# Patient Record
Sex: Male | Born: 1960 | Race: Black or African American | Hispanic: No | Marital: Married | Smoking: Never smoker
Health system: Southern US, Community
[De-identification: ages and names within clinical notes are randomized; demographics above are authoritative.]

## PROBLEM LIST (undated history)

## (undated) DIAGNOSIS — G4730 Sleep apnea, unspecified: Secondary | ICD-10-CM

## (undated) DIAGNOSIS — M259 Joint disorder, unspecified: Secondary | ICD-10-CM

## (undated) DIAGNOSIS — M1990 Unspecified osteoarthritis, unspecified site: Secondary | ICD-10-CM

## (undated) DIAGNOSIS — M549 Dorsalgia, unspecified: Secondary | ICD-10-CM

## (undated) DIAGNOSIS — Z9109 Other allergy status, other than to drugs and biological substances: Secondary | ICD-10-CM

## (undated) HISTORY — DX: Dorsalgia, unspecified: M54.9

## (undated) HISTORY — DX: Joint disorder, unspecified: M25.9

## (undated) HISTORY — PX: APPENDECTOMY: SHX54

## (undated) HISTORY — PX: WISDOM TOOTH EXTRACTION: SHX21

## (undated) HISTORY — PX: KNEE SURGERY: SHX244

---

## 1997-12-27 ENCOUNTER — Emergency Department (HOSPITAL_COMMUNITY): Admission: EM | Admit: 1997-12-27 | Discharge: 1997-12-27 | Payer: Self-pay | Admitting: Emergency Medicine

## 1998-09-15 ENCOUNTER — Emergency Department (HOSPITAL_COMMUNITY): Admission: EM | Admit: 1998-09-15 | Discharge: 1998-09-15 | Payer: Self-pay | Admitting: Emergency Medicine

## 2000-04-21 ENCOUNTER — Encounter: Payer: Self-pay | Admitting: Emergency Medicine

## 2000-04-21 ENCOUNTER — Emergency Department (HOSPITAL_COMMUNITY): Admission: EM | Admit: 2000-04-21 | Discharge: 2000-04-21 | Payer: Self-pay | Admitting: Emergency Medicine

## 2000-09-29 ENCOUNTER — Emergency Department (HOSPITAL_COMMUNITY): Admission: EM | Admit: 2000-09-29 | Discharge: 2000-09-30 | Payer: Self-pay

## 2000-09-29 ENCOUNTER — Emergency Department (HOSPITAL_COMMUNITY): Admission: EM | Admit: 2000-09-29 | Discharge: 2000-09-29 | Payer: Self-pay | Admitting: Emergency Medicine

## 2001-03-16 ENCOUNTER — Emergency Department (HOSPITAL_COMMUNITY): Admission: EM | Admit: 2001-03-16 | Discharge: 2001-03-17 | Payer: Self-pay | Admitting: Emergency Medicine

## 2001-08-27 ENCOUNTER — Encounter (INDEPENDENT_AMBULATORY_CARE_PROVIDER_SITE_OTHER): Payer: Self-pay | Admitting: Specialist

## 2001-08-28 ENCOUNTER — Inpatient Hospital Stay (HOSPITAL_COMMUNITY): Admission: EM | Admit: 2001-08-28 | Discharge: 2001-08-29 | Payer: Self-pay | Admitting: Emergency Medicine

## 2002-04-17 ENCOUNTER — Emergency Department (HOSPITAL_COMMUNITY): Admission: EM | Admit: 2002-04-17 | Discharge: 2002-04-17 | Payer: Self-pay

## 2002-06-08 ENCOUNTER — Encounter: Payer: Self-pay | Admitting: Emergency Medicine

## 2002-06-08 ENCOUNTER — Emergency Department (HOSPITAL_COMMUNITY): Admission: EM | Admit: 2002-06-08 | Discharge: 2002-06-08 | Payer: Self-pay | Admitting: Emergency Medicine

## 2002-06-13 ENCOUNTER — Encounter: Payer: Self-pay | Admitting: Emergency Medicine

## 2002-06-13 ENCOUNTER — Emergency Department (HOSPITAL_COMMUNITY): Admission: EM | Admit: 2002-06-13 | Discharge: 2002-06-13 | Payer: Self-pay | Admitting: Emergency Medicine

## 2002-06-15 ENCOUNTER — Emergency Department (HOSPITAL_COMMUNITY): Admission: EM | Admit: 2002-06-15 | Discharge: 2002-06-15 | Payer: Self-pay | Admitting: Emergency Medicine

## 2002-07-23 ENCOUNTER — Emergency Department (HOSPITAL_COMMUNITY): Admission: EM | Admit: 2002-07-23 | Discharge: 2002-07-23 | Payer: Self-pay | Admitting: Emergency Medicine

## 2002-07-24 ENCOUNTER — Encounter: Payer: Self-pay | Admitting: Emergency Medicine

## 2002-07-25 ENCOUNTER — Emergency Department (HOSPITAL_COMMUNITY): Admission: EM | Admit: 2002-07-25 | Discharge: 2002-07-26 | Payer: Self-pay | Admitting: Emergency Medicine

## 2002-09-09 ENCOUNTER — Encounter: Payer: Self-pay | Admitting: Orthopedic Surgery

## 2002-09-09 ENCOUNTER — Ambulatory Visit (HOSPITAL_COMMUNITY): Admission: RE | Admit: 2002-09-09 | Discharge: 2002-09-09 | Payer: Self-pay | Admitting: Orthopedic Surgery

## 2003-01-25 ENCOUNTER — Emergency Department (HOSPITAL_COMMUNITY): Admission: EM | Admit: 2003-01-25 | Discharge: 2003-01-25 | Payer: Self-pay | Admitting: Emergency Medicine

## 2003-03-17 ENCOUNTER — Emergency Department (HOSPITAL_COMMUNITY): Admission: EM | Admit: 2003-03-17 | Discharge: 2003-03-17 | Payer: Self-pay | Admitting: Emergency Medicine

## 2003-06-04 ENCOUNTER — Emergency Department (HOSPITAL_COMMUNITY): Admission: EM | Admit: 2003-06-04 | Discharge: 2003-06-04 | Payer: Self-pay | Admitting: *Deleted

## 2003-06-05 ENCOUNTER — Emergency Department (HOSPITAL_COMMUNITY): Admission: EM | Admit: 2003-06-05 | Discharge: 2003-06-05 | Payer: Self-pay | Admitting: Emergency Medicine

## 2003-12-27 ENCOUNTER — Emergency Department (HOSPITAL_COMMUNITY): Admission: EM | Admit: 2003-12-27 | Discharge: 2003-12-28 | Payer: Self-pay | Admitting: Emergency Medicine

## 2004-12-15 ENCOUNTER — Emergency Department (HOSPITAL_COMMUNITY): Admission: EM | Admit: 2004-12-15 | Discharge: 2004-12-15 | Payer: Self-pay | Admitting: Emergency Medicine

## 2005-02-24 ENCOUNTER — Emergency Department (HOSPITAL_COMMUNITY): Admission: EM | Admit: 2005-02-24 | Discharge: 2005-02-24 | Payer: Self-pay | Admitting: Emergency Medicine

## 2005-05-01 ENCOUNTER — Emergency Department (HOSPITAL_COMMUNITY): Admission: EM | Admit: 2005-05-01 | Discharge: 2005-05-01 | Payer: Self-pay | Admitting: Emergency Medicine

## 2007-01-24 ENCOUNTER — Encounter: Admission: RE | Admit: 2007-01-24 | Discharge: 2007-02-26 | Payer: Self-pay | Admitting: Orthopedic Surgery

## 2007-01-31 ENCOUNTER — Emergency Department (HOSPITAL_COMMUNITY): Admission: EM | Admit: 2007-01-31 | Discharge: 2007-01-31 | Payer: Self-pay | Admitting: Emergency Medicine

## 2007-02-04 ENCOUNTER — Emergency Department (HOSPITAL_COMMUNITY): Admission: EM | Admit: 2007-02-04 | Discharge: 2007-02-04 | Payer: Self-pay | Admitting: Family Medicine

## 2009-05-13 ENCOUNTER — Emergency Department (HOSPITAL_COMMUNITY): Admission: EM | Admit: 2009-05-13 | Discharge: 2009-05-13 | Payer: Self-pay | Admitting: Emergency Medicine

## 2010-09-02 NOTE — H&P (Signed)
[facility]  Patient:    Craig Lopez, Craig Lopez Visit Number: [id number] MRN: [id number]          Service Type: MED Location: [phone] Attending Physician:  Arlis Porta Dictated by:   Adolph Pollack, M.D. Admit Date:  08/27/2001                           History and Physical  CHIEF COMPLAINT:  Right lower quadrant abdominal pain.  HISTORY OF PRESENT ILLNESS:  The patient is a 50 year old male who two days ago began having some vague abdominal pain with diarrhea.  After the diarrheal discomfort improved somewhat, then he began having some nausea and vomiting and then sharp right lower quadrant pain that became persistently worse throughout today.  He has had no fever or chills.  He still had some nausea. He tried eating some hamburgers for supper, which he was able to keep down; however, this actually made the pain somewhat worse.  He presented to the emergency department because of the progressively increasing pain in the right lower quadrant region.  He had a normal bowel movement yesterday but none today.  No dysuria or hematuria.  No blood in his stool.  He states he has never had any anything like this before.  PAST MEDICAL HISTORY:  No chronic illnesses.  PAST SURGICAL HISTORY:  Left knee surgery x 4.  DRUG ALLERGIES:  None known.  MEDICATIONS:  Zyrtec for intermittent allergic rhinitis.  SOCIAL HISTORY:  Denies tobacco use.  Occasionally has an alcoholic beverage. He is married and works in Development worker, international aid.  FAMILY HISTORY:  Positive for diabetes mellitus in the majority of his family.  REVIEW OF SYSTEMS:  CARDIOVASCULAR:  No hypertension or heart disease. PULMONARY:  No recent upper respiratory infections.  No tuberculosis or pneumonia or asthma.  GASTROINTESTINAL:  No hepatitis or peptic ulcer disease. GENITOURINARY:  No kidney stones.  ENDOCRINE:  No diabetes.  NEUROLOGIC:  No seizures.  HEMATOLOGIC:  No bleeding disorders  or blood clots.  PHYSICAL EXAMINATION:  GENERAL:  Well-developed, well-nourished male in no acute distress.  Pleasant and cooperative.  VITAL SIGNS:  Temperature normal at 98.4, with normal blood pressure and heart rate.  NECK:  Supple.  Without palpable masses.  CARDIOVASCULAR:  Regular rate and rhythm without a murmur.  LUNGS:  Breath sounds equal and clear.  Respirations are nonlabored.  ABDOMEN:  Soft.  There is right lower quadrant tenderness and guarding over McBurneys point region.  He does have slight referred signs with a positive obturator sign and a positive iliopsoas sign.  Decreased bowel sounds.  GENITOURINARY:  No hernias present.  No penile lesions.  MUSCULOSKELETAL:  Full range of motion.  No cyanosis or edema is present.  LABORATORY DATA:  Remarkable for white blood cell count of 6200, with a normal differential.  Hemoglobin 13.8.  Urinalysis is negative.  IMPRESSION:  Focal right lower quadrant tenderness and guarding consistent with focal peritoneal signs.  He has a normal white blood cell count with normal differential.  Normal urinalysis.  He does now have some anorexia and nausea.  His pain has been progressively worsening.  Concern is for acute appendicitis.  Other possibilities include a mesenteric adenitis or a viral gastroenteritis.  PLAN:  I discussed options with him including observation.  We also discussed abdominal CT scan which I told him may or may not be helpful, depending on whether we could see the  appendix.  I discussed the definitive procedure, which was a diagnostic laparoscopy.  I said that we would be able to visualize the appendix and have a definitive answer based on this.  After discussing it further with him, he has chosen the diagnostic laparoscopy.  I did explain the procedure and the risks including but not limited to bleeding, infection, accidental damage to intra-abdominal organs such as the intestine or bladder. I also  discussed with him the fact that if the appendix was normal and it would be simple to remove we may do an incidental appendectomy so if the situation arises again there will be no question.  He seems to understand this and agrees to proceed.Dictated by:   Adolph Pollack, M.D. Attending Physician:  Arlis Porta DD:  08/27/01 TD:  08/29/01 Job: [id number]

## 2010-09-02 NOTE — Op Note (Signed)
[facility]  Patient:    Craig Lopez, Craig Lopez Visit Number: [id number] MRN: [id number]          Service Type: MED Location: [phone] Attending Physician:  Arlis Porta Dictated by:   Adolph Pollack, M.D. Proc. Date: 08/28/01 Admit Date:  08/27/2001                             Operative Report  PREOPERATIVE DIAGNOSIS:  Acute appendicitis.  POSTOPERATIVE DIAGNOSIS:  Acute appendicitis.  PROCEDURE:  Laparoscopic appendectomy.  SURGEON:  Adolph Pollack, M.D.  ANESTHESIA:  General.  INDICATIONS FOR PROCEDURE:  This is a 50 year old male with vague abdominal pain two days ago that radiated to the right lower quadrant and has been progressively worsening. He has peritoneal signs and a right lower quadrant examination. He is now brought to the operating room.  TECHNIQUE:  He is placed supine on the operating table and a general anesthetic was administered. A Foley catheter was placed in his bladder. His abdominal wall was shaved and sterilely prepped and draped. A small subumbilical incision was made incising the skin sharply down to the level of the fascia. A 1 1/2 cm incision was made in the midline fascia. The peritoneal cavity was entered sharply and under direct vision. No underlying bowel injury was noted. A Hasson trocar was introduced into the peritoneal cavity and a pneumoperitoneum created by insufflation of CO2 gas. Next, the laparoscope was introduced. The patient was placed in the Trendelenburg position and rotated toward the left. An inflamed indurated appendix was noted in the right lower quadrant. Under direct vision, a 5 mm trocar was placed in the suprapubic region just to the right of the midline. Another 5 mm trocar was placed in the right upper quadrant. The appendix was grasped and I divided the mesoappendix and appendiceal artery with the harmonic scalpel completely mobilizing the appendix from the lateral  attachments of the abdominal wall. I did this down to the base of the cecum. I then amputated the appendix at the base of the cecum with the endoGIA stapler. Clips were placed on the lateral portion of the staple line as I had to cut part of this away. This allowed for adequate shielding of the cecal area where the appendix was amputated.  Next, I placed the appendix in an endopouch bag and removed it from the abdominal cavity. I then replaced the Hasson trocar and irrigated out the right lower quadrant with a liter of normal saline. I evacuated the fluid. I examined the staple line and it was intact without evidence of bleeding or leakage.  Next, I removed the right lower quadrant trocar. I then removed the Hasson trocar and under laparoscopic vision closed the fascial defect by tightening up and tying down the pursestring suture. Finally the pneumoperitoneum was released and the remaining 5 mm right upper quadrant trocar was removed. The skin incisions were closed with 4-0 monocryl subcuticular stitches. Steri-Strips and sterile dressings were applied.  He tolerated the procedure well without any apparent complications and was taken to the recovery room in satisfactory condition. Dictated by:   Adolph Pollack, M.D. Attending Physician:  Arlis Porta DD:  08/28/01 TD:  08/29/01 Job: [id number]

## 2012-08-05 ENCOUNTER — Encounter (HOSPITAL_BASED_OUTPATIENT_CLINIC_OR_DEPARTMENT_OTHER): Payer: Self-pay | Admitting: *Deleted

## 2012-08-05 ENCOUNTER — Emergency Department (HOSPITAL_BASED_OUTPATIENT_CLINIC_OR_DEPARTMENT_OTHER)
Admission: EM | Admit: 2012-08-05 | Discharge: 2012-08-06 | Disposition: A | Discharge status: Home / Self Care | Payer: Non-veteran care | Attending: Emergency Medicine | Admitting: Emergency Medicine

## 2012-08-05 DIAGNOSIS — H1045 Other chronic allergic conjunctivitis: Secondary | ICD-10-CM | POA: Insufficient documentation

## 2012-08-05 DIAGNOSIS — H1013 Acute atopic conjunctivitis, bilateral: Secondary | ICD-10-CM

## 2012-08-05 DIAGNOSIS — H5789 Other specified disorders of eye and adnexa: Secondary | ICD-10-CM | POA: Insufficient documentation

## 2012-08-05 DIAGNOSIS — R509 Fever, unspecified: Secondary | ICD-10-CM | POA: Insufficient documentation

## 2012-08-05 DIAGNOSIS — J329 Chronic sinusitis, unspecified: Secondary | ICD-10-CM | POA: Insufficient documentation

## 2012-08-05 MED ORDER — AMOXICILLIN-POT CLAVULANATE 875-125 MG PO TABS: ORAL_TABLET | ORAL | Status: DC

## 2012-08-05 MED ORDER — AZELASTINE HCL 0.05 % OP SOLN: 1.0000 [drp] | Freq: Two times a day (BID) | OPHTHALMIC | Status: DC

## 2012-08-05 MED ORDER — KETOROLAC TROMETHAMINE 30 MG/ML IJ SOLN: 30.0000 mg | Freq: Once | INTRAMUSCULAR | Status: DC

## 2012-08-05 NOTE — ED Provider Notes (Signed)
History     CSN: [id number]  Arrival date & time 08/05/12  4098   First MD Initiated Contact with Patient 08/05/12 2215      Chief Complaint  Patient presents with  . Nasal Congestion    (Consider location/radiation/quality/duration/timing/severity/associated sxs/prior treatment) HPI Comments: Craig Lopez is a 52 y/o M presenting to the ED with nasal congestion x 1 week ago. Patient reported that he has been feeling facial pressure around in left eye and maxillary region for the past week as well. Stated that when he blows his nose, yellow, thick mucus comes out. Reported eye injection along with burning and pruritis that has been going on x 1 week. Patient reported that he is allergic to "almost everything." Patient reported that he has been using over the counter medication without success from relief. Associated symptoms are subjective fevers. Reported that it is common for him to get a sinus infection every 2 years. Denied chills, cough, sneezing, chest pain, shortness of breathe, difficulty breathing, gi symptoms, urinary symptoms, ear pain, tinnitus, bodyaches, dysphagia, eye tearing, injury, denied any other complaints.    The history is provided by the patient. No language interpreter was used.    History reviewed. No pertinent past medical history.  Past Surgical History  Procedure Laterality Date  . Joint replacement    . Appendectomy      History reviewed. No pertinent family history.  History  Substance Use Topics  . Smoking status: Never Smoker   . Smokeless tobacco: Not on file  . Alcohol Use: No      Review of Systems  Constitutional: Positive for fever. Negative for chills.  HENT: Positive for congestion, rhinorrhea and sinus pressure. Negative for ear pain, sore throat, sneezing, drooling, trouble swallowing, neck pain, neck stiffness, postnasal drip and tinnitus.   Eyes: Positive for redness and itching. Negative for photophobia, pain and visual  disturbance.  Respiratory: Negative for cough, chest tightness and shortness of breath.   Cardiovascular: Negative for chest pain.  Gastrointestinal: Negative for nausea, vomiting, abdominal pain, diarrhea and constipation.  Genitourinary: Negative for dysuria, hematuria, decreased urine volume and difficulty urinating.  Neurological: Negative for dizziness, weakness, light-headedness, numbness and headaches.  All other systems reviewed and are negative.    Allergies  Review of patient's allergies indicates no known allergies.  Home Medications   Current Outpatient Rx  Name  Route  Sig  Dispense  Refill  . amoxicillin-clavulanate (AUGMENTIN) 875-125 MG per tablet      Take 1 tablet PO BID x 7 days.   14 tablet   0   . azelastine (OPTIVAR) 0.05 % ophthalmic solution   Both Eyes   Place 1 drop into both eyes 2 (two) times daily.   6 mL   0     BP 129/92  Pulse 85  Temp(Src) 97.8 F (36.6 C) (Oral)  Resp 16  Ht 6\' 1"  (1.854 m)  Wt 286 lb (129.729 kg)  BMI 37.74 kg/m2  SpO2 100%  Physical Exam  Nursing note and vitals reviewed. Constitutional: He is oriented to person, place, and time. He appears well-developed and well-nourished. No distress.  HENT:  Head: Normocephalic and atraumatic.    Mouth/Throat: Oropharynx is clear and moist. No oropharyngeal exudate.  Pressure felt upon palpation to the left maxillary region and left temple region Pressure felt when head tilted forward between legs, predominantly to the left maxillary region  Eyes: EOM are normal. Pupils are equal, round, and reactive to light. Right  eye exhibits no discharge. Left eye exhibits no discharge.  Injection noted to eyes bilaterally Negative tearing of eyes  Neck: Normal range of motion. Neck supple. No tracheal deviation present.  Negative lymphadenopathy  Cardiovascular: Normal rate, regular rhythm and normal heart sounds.  Exam reveals no friction rub.   No murmur heard. Pulmonary/Chest:  Effort normal and breath sounds normal. No respiratory distress. He has no wheezes. He has no rales. He exhibits no tenderness.  Lymphadenopathy:    He has no cervical adenopathy.  Neurological: He is alert and oriented to person, place, and time. No cranial nerve deficit. He exhibits normal muscle tone. Coordination normal.  Skin: Skin is warm and dry. No rash noted. He is not diaphoretic. No erythema.  Psychiatric: He has a normal mood and affect. His behavior is normal. Thought content normal.    ED Course  Procedures (including critical care time)  Labs Reviewed - No data to display No results found.   1. Sinusitis   2. Allergic conjunctivitis, bilateral       MDM  Patient afebrile, normotensive, non-tachycardic, alert and oriented. Denied pain. Pruritic, injected eyes with reported allergies - suspected allergic conjunctivitis. Left maxillary facial pressure upon palpation and with tilting of head forward, suspected sinusitis. Discussed case with Dr. Manus Gunning, agreed that CT maxillofacial was not warranted at this moment. Patient non-toxic appearing, aseptic, no acute distress. Patient pleasant and in good spirits upon exam and evaluation. Discharged patient. Discharged patient with antibiotics and anti-histamine eye drops - explained course of treatment. Recommended patient to follow-up with [facility] to be re-evaluated. Discussed with patient to use vix vapor rub and humidifier to aid in congestion. Discussed with patient to rest and stay hydrated. Discussed with patient to monitor symptoms and if symptoms are to worsen or change to report back to the ED. Patient agreed to plan of care, understood, all questions answered.         Raymon Mutton, PA-C 08/06/12 1600

## 2012-08-05 NOTE — ED Notes (Signed)
Pt c/o nasal congestion x 1 week no relief from OTC.

## 2012-08-06 NOTE — ED Provider Notes (Signed)
Medical screening examination/treatment/procedure(s) were performed by non-physician practitioner and as supervising physician I was immediately available for consultation/collaboration.   Glynn Octave, MD 08/06/12 [phone]

## 2012-09-02 ENCOUNTER — Encounter (HOSPITAL_COMMUNITY): Payer: Self-pay | Admitting: *Deleted

## 2012-09-02 ENCOUNTER — Emergency Department (HOSPITAL_COMMUNITY): Payer: Non-veteran care

## 2012-09-02 ENCOUNTER — Emergency Department (HOSPITAL_COMMUNITY)
Admission: EM | Admit: 2012-09-02 | Discharge: 2012-09-02 | Disposition: A | Discharge status: Home / Self Care | Payer: Non-veteran care | Attending: Emergency Medicine | Admitting: Emergency Medicine

## 2012-09-02 DIAGNOSIS — J069 Acute upper respiratory infection, unspecified: Secondary | ICD-10-CM | POA: Insufficient documentation

## 2012-09-02 HISTORY — DX: Other allergy status, other than to drugs and biological substances: Z91.09

## 2012-09-02 MED ORDER — AZITHROMYCIN 250 MG PO TABS
500.0000 mg | ORAL_TABLET | Freq: Once | ORAL | Status: AC
  Administered 2012-09-02: 500 mg via ORAL
  Filled 2012-09-02: qty 2

## 2012-09-02 MED ORDER — AZITHROMYCIN 250 MG PO TABS: 250.0000 mg | ORAL_TABLET | Freq: Every day | ORAL | Status: DC

## 2012-09-02 MED ORDER — ALBUTEROL SULFATE HFA 108 (90 BASE) MCG/ACT IN AERS
2.0000 | INHALATION_SPRAY | RESPIRATORY_TRACT | Status: DC | PRN
  Administered 2012-09-02: 2 via RESPIRATORY_TRACT
  Filled 2012-09-02: qty 6.7

## 2012-09-02 MED ORDER — GUAIFENESIN-CODEINE 100-10 MG/5ML PO SYRP: 5.0000 mL | ORAL_SOLUTION | Freq: Three times a day (TID) | ORAL | Status: DC | PRN

## 2012-09-02 MED ORDER — GUAIFENESIN-CODEINE 100-10 MG/5ML PO SOLN: 5.0000 mL | Freq: Once | ORAL | Status: DC

## 2012-09-02 NOTE — ED Notes (Signed)
Pt states that he has had a recent sinus infection a few weeks ago and has had a present cough; pt states that he has had a persistent cough for the last few weeks; pt states that he began to have some soreness with cough approx 10 days ago and that it has gotten progressively worse; pt states that he feel sore in his chest bilaterally and worse with cough.

## 2012-09-02 NOTE — ED Provider Notes (Signed)
History    This chart was scribed for non-physician practitioner, Dorthula Matas PA-C working with Hurman Horn, MD by Donne Anon, ED Scribe. This patient was seen in room WTR8/WTR8 and the patient's care was started at 2126.   CSN: [id number]  Arrival date & time 09/02/12  1910   First MD Initiated Contact with Patient 09/02/12 2126      Chief Complaint  Patient presents with  . Cough     The history is provided by the patient. No language interpreter was used.   HPI Comments: Craig Lopez is a 52 y.o. male who presents to the Emergency Department complaining of gradual onset, gradually worsening, constant cough which began more than 1 month ago. He states that he had a sinus infection a few weeks ago that was relieved by Augmentin and the cough began after the sinus infection resolved. He reports associated chest wall tenderness that is worse when he coughs as well as rhinorhea. He reports that he experiences coughing fits, especially when he talks to much. He denies CP, SOB, fever or any other pain. He currently takes Claritin for his allergies which provide mild relief. He does not take blood pressure medication and states he is otherwise healthy.   Past Medical History  Diagnosis Date  . Environmental allergies     Past Surgical History  Procedure Laterality Date  . Joint replacement    . Appendectomy    . Knee surgery      No family history on file.  History  Substance Use Topics  . Smoking status: Never Smoker   . Smokeless tobacco: Not on file  . Alcohol Use: Yes     Comment: socially      Review of Systems  Constitutional: Negative for fever.  HENT: Positive for rhinorrhea. Negative for congestion.   Respiratory: Positive for cough. Negative for shortness of breath.   Cardiovascular: Negative for chest pain.  All other systems reviewed and are negative.    Allergies  Review of patient's allergies indicates no known allergies.  Home Medications    Current Outpatient Rx  Name  Route  Sig  Dispense  Refill  . azelastine (OPTIVAR) 0.05 % ophthalmic solution   Both Eyes   Place 1 drop into both eyes 2 (two) times daily.   6 mL   0   . ibuprofen (ADVIL,MOTRIN) 800 MG tablet   Oral   Take 800 mg by mouth every 8 (eight) hours as needed for pain.         Marland Kitchen azithromycin (ZITHROMAX Z-PAK) 250 MG tablet   Oral   Take 1 tablet (250 mg total) by mouth daily.   4 tablet   0     Starting on 09/03/2012   . guaiFENesin-codeine (ROBITUSSIN AC) 100-10 MG/5ML syrup   Oral   Take 5 mLs by mouth 3 (three) times daily as needed for cough.   120 mL   0     BP 122/76  Pulse 70  Temp(Src) 98.1 F (36.7 C) (Oral)  Resp 18  Ht 6\' 1"  (1.854 m)  Wt 282 lb (127.914 kg)  BMI 37.21 kg/m2  SpO2 99%  Physical Exam  Nursing note and vitals reviewed. Constitutional: He is oriented to person, place, and time. He appears well-developed and well-nourished. No distress.  HENT:  Head: Normocephalic and atraumatic.  Eyes: EOM are normal.  Neck: Neck supple. No tracheal deviation present.  Cardiovascular: Normal rate.   Pulmonary/Chest: Effort normal. No respiratory  distress. He has no wheezes.  Coughing during exam   Musculoskeletal: Normal range of motion.  Neurological: He is alert and oriented to person, place, and time.  Skin: Skin is warm and dry.  Psychiatric: He has a normal mood and affect. His behavior is normal.    ED Course  Procedures (including critical care time) DIAGNOSTIC STUDIES: Oxygen Saturation is 99% on room air, normal by my interpretation.    COORDINATION OF CARE: 9:27 PM Discussed treatment plan which includes 500 mg Zithromax, Albuterol, guaifenesin-codeine and a decongestant with pt at bedside and pt agreed to plan. Informed of normal CXR.  Meds ordered this encounter  Medications  . ibuprofen (ADVIL,MOTRIN) 800 MG tablet    Sig: Take 800 mg by mouth every 8 (eight) hours as needed for pain.  Marland Kitchen  azithromycin (ZITHROMAX) tablet 500 mg    Sig:   . guaiFENesin-codeine 100-10 MG/5ML solution 5 mL    Sig:   . albuterol (PROVENTIL HFA;VENTOLIN HFA) 108 (90 BASE) MCG/ACT inhaler 2 puff    Sig:   . guaiFENesin-codeine (ROBITUSSIN AC) 100-10 MG/5ML syrup    Sig: Take 5 mLs by mouth 3 (three) times daily as needed for cough.    Dispense:  120 mL    Refill:  0    Order Specific Question:  Supervising Provider    Answer:  Eber Hong D [[id number]]  . azithromycin (ZITHROMAX Z-PAK) 250 MG tablet    Sig: Take 1 tablet (250 mg total) by mouth daily.    Dispense:  4 tablet    Refill:  0    Starting on 09/03/2012    Order Specific Question:  Supervising Provider    Answer:  Eber Hong D [[id number]]     Labs Reviewed - No data to display Dg Chest 2 View  09/02/2012   *RADIOLOGY REPORT*  Clinical Data: Cough.  Pleuritic chest pain.  CHEST - 2 VIEW  Comparison:  None.  Findings:  The heart size and mediastinal contours are within normal limits.  Both lungs are clear.  The visualized skeletal structures are unremarkable.  IMPRESSION: No active cardiopulmonary disease.   Original Report Authenticated By: Myles Rosenthal, M.D.     1. URI (upper respiratory infection)       MDM  Pt has been advised of the symptoms that warrant their return to the ED. Patient has voiced understanding and has agreed to follow-up with the PCP or specialist.  I personally performed the services described in this documentation, which was scribed in my presence. The recorded information has been reviewed and is accurate.       Dorthula Matas, PA-C 09/02/12 2143

## 2012-09-09 NOTE — ED Provider Notes (Signed)
Medical screening examination/treatment/procedure(s) were performed by non-physician practitioner and as supervising physician I was immediately available for consultation/collaboration.   Deb Loudin M Eldana Isip, MD 09/09/12 1640 

## 2012-11-30 ENCOUNTER — Encounter (HOSPITAL_BASED_OUTPATIENT_CLINIC_OR_DEPARTMENT_OTHER): Payer: Self-pay | Admitting: *Deleted

## 2012-11-30 ENCOUNTER — Emergency Department (HOSPITAL_BASED_OUTPATIENT_CLINIC_OR_DEPARTMENT_OTHER)
Admission: EM | Admit: 2012-11-30 | Discharge: 2012-11-30 | Disposition: A | Discharge status: Home / Self Care | Payer: Non-veteran care | Attending: Emergency Medicine | Admitting: Emergency Medicine

## 2012-11-30 DIAGNOSIS — Y9289 Other specified places as the place of occurrence of the external cause: Secondary | ICD-10-CM | POA: Insufficient documentation

## 2012-11-30 DIAGNOSIS — W57XXXA Bitten or stung by nonvenomous insect and other nonvenomous arthropods, initial encounter: Secondary | ICD-10-CM | POA: Insufficient documentation

## 2012-11-30 DIAGNOSIS — S30860A Insect bite (nonvenomous) of lower back and pelvis, initial encounter: Secondary | ICD-10-CM | POA: Insufficient documentation

## 2012-11-30 DIAGNOSIS — L299 Pruritus, unspecified: Secondary | ICD-10-CM | POA: Insufficient documentation

## 2012-11-30 DIAGNOSIS — R21 Rash and other nonspecific skin eruption: Secondary | ICD-10-CM | POA: Insufficient documentation

## 2012-11-30 DIAGNOSIS — Y9389 Activity, other specified: Secondary | ICD-10-CM | POA: Insufficient documentation

## 2012-11-30 DIAGNOSIS — L0390 Cellulitis, unspecified: Secondary | ICD-10-CM

## 2012-11-30 DIAGNOSIS — L0231 Cutaneous abscess of buttock: Secondary | ICD-10-CM | POA: Insufficient documentation

## 2012-11-30 MED ORDER — CLINDAMYCIN HCL 300 MG PO CAPS: 300.0000 mg | ORAL_CAPSULE | Freq: Four times a day (QID) | ORAL | Status: DC

## 2012-11-30 MED ORDER — DIPHENHYDRAMINE HCL 25 MG PO CAPS
25.0000 mg | ORAL_CAPSULE | Freq: Once | ORAL | Status: AC
  Administered 2012-11-30: 25 mg via ORAL
  Filled 2012-11-30: qty 1

## 2012-11-30 MED ORDER — DIPHENHYDRAMINE HCL 50 MG/ML IJ SOLN
INTRAMUSCULAR | Status: AC
  Filled 2012-11-30: qty 1

## 2012-11-30 MED ORDER — CLINDAMYCIN HCL 150 MG PO CAPS
300.0000 mg | ORAL_CAPSULE | Freq: Once | ORAL | Status: AC
  Administered 2012-11-30: 300 mg via ORAL
  Filled 2012-11-30: qty 2

## 2012-11-30 MED ORDER — DIPHENHYDRAMINE HCL 50 MG/ML IJ SOLN
25.0000 mg | Freq: Once | INTRAMUSCULAR | Status: AC
  Administered 2012-11-30: 25 mg via INTRAMUSCULAR

## 2012-11-30 NOTE — ED Provider Notes (Signed)
CSN: [id number]     Arrival date & time 11/30/12  1146 History     First MD Initiated Contact with Patient 11/30/12 1148     Chief Complaint  Patient presents with  . Insect Bite   (Consider location/radiation/quality/duration/timing/severity/associated sxs/prior Treatment) The history is provided by the patient.  Craig Lopez is a 52 y.o. male here with possible cellulitis. Was doing yard work yesterday and noticed that he was bitten by a Animator on the right buttock. He noticed increased swelling around the site today and itchiness on his legs. No fever or chills. Denies any other bites. Denies tick exposure.    Past Medical History  Diagnosis Date  . Environmental allergies    Past Surgical History  Procedure Laterality Date  . Joint replacement    . Appendectomy    . Knee surgery     No family history on file. History  Substance Use Topics  . Smoking status: Never Smoker   . Smokeless tobacco: Not on file  . Alcohol Use: Yes     Comment: socially    Review of Systems  Skin: Positive for rash.  All other systems reviewed and are negative.    Allergies  Review of patient's allergies indicates no known allergies.  Home Medications   Current Outpatient Rx  Name  Route  Sig  Dispense  Refill  . azelastine (OPTIVAR) 0.05 % ophthalmic solution   Both Eyes   Place 1 drop into both eyes 2 (two) times daily.   6 mL   0   . azithromycin (ZITHROMAX Z-PAK) 250 MG tablet   Oral   Take 1 tablet (250 mg total) by mouth daily.   4 tablet   0     Starting on 09/03/2012   . clindamycin (CLEOCIN) 300 MG capsule   Oral   Take 1 capsule (300 mg total) by mouth 4 (four) times daily. X 7 days   28 capsule   0   . guaiFENesin-codeine (ROBITUSSIN AC) 100-10 MG/5ML syrup   Oral   Take 5 mLs by mouth 3 (three) times daily as needed for cough.   120 mL   0   . ibuprofen (ADVIL,MOTRIN) 800 MG tablet   Oral   Take 800 mg by mouth every 8 (eight) hours as needed for  pain.          BP 143/90  Pulse 73  Temp(Src) 97.3 F (36.3 C) (Oral)  Resp 18  SpO2 97% Physical Exam  Nursing note and vitals reviewed. Constitutional: He is oriented to person, place, and time. He appears well-developed.  HENT:  Head: Normocephalic.  Eyes: Conjunctivae are normal. Pupils are equal, round, and reactive to light.  Neck: Normal range of motion. Neck supple.  Cardiovascular: Normal rate.   Pulmonary/Chest: Effort normal.  Abdominal: Soft.  Musculoskeletal: Normal range of motion.  Neurological: He is alert and oriented to person, place, and time.  Skin: Skin is warm and dry. Rash noted.     R buttock with an area of erythema with tenderness. No fluctuance. No target lesion. No rash on legs  Psychiatric: He has a normal mood and affect. His behavior is normal. Judgment and thought content normal.    ED Course   Procedures (including critical care time)  Labs Reviewed - No data to display No results found. 1. Cellulitis   2. Bug bite     MDM  Craig Lopez is a 52 y.o. male here with cellulitis s/p  insect bite with early cellulitis and no evidence of abscess. Will give clinda for empiric coverage. Recommend benadryl prn itchiness.    Richardean Canal, MD 11/30/12 [phone]

## 2012-11-30 NOTE — ED Notes (Signed)
States that he was doing yard work yesterday and was bitten by a Animator and now having itching all over

## 2012-12-01 ENCOUNTER — Emergency Department (HOSPITAL_BASED_OUTPATIENT_CLINIC_OR_DEPARTMENT_OTHER)
Admission: EM | Admit: 2012-12-01 | Discharge: 2012-12-01 | Disposition: A | Discharge status: Home / Self Care | Payer: Non-veteran care | Attending: Emergency Medicine | Admitting: Emergency Medicine

## 2012-12-01 ENCOUNTER — Encounter (HOSPITAL_BASED_OUTPATIENT_CLINIC_OR_DEPARTMENT_OTHER): Payer: Self-pay

## 2012-12-01 DIAGNOSIS — L0390 Cellulitis, unspecified: Secondary | ICD-10-CM

## 2012-12-01 DIAGNOSIS — L02219 Cutaneous abscess of trunk, unspecified: Secondary | ICD-10-CM | POA: Insufficient documentation

## 2012-12-01 DIAGNOSIS — R21 Rash and other nonspecific skin eruption: Secondary | ICD-10-CM | POA: Insufficient documentation

## 2012-12-01 MED ORDER — HYDROXYZINE HCL 25 MG PO TABS: 25.0000 mg | ORAL_TABLET | Freq: Four times a day (QID) | ORAL | Status: DC

## 2012-12-01 MED ORDER — HYDROCODONE-ACETAMINOPHEN 5-325 MG PO TABS: 2.0000 | ORAL_TABLET | ORAL | Status: DC | PRN

## 2012-12-01 MED ORDER — HYDROCODONE-ACETAMINOPHEN 5-325 MG PO TABS
2.0000 | ORAL_TABLET | Freq: Once | ORAL | Status: AC
  Administered 2012-12-01: 2 via ORAL
  Filled 2012-12-01: qty 2

## 2012-12-01 MED ORDER — DIPHENHYDRAMINE HCL 25 MG PO CAPS
25.0000 mg | ORAL_CAPSULE | Freq: Once | ORAL | Status: AC
  Administered 2012-12-01: 25 mg via ORAL
  Filled 2012-12-01: qty 1

## 2012-12-01 NOTE — ED Provider Notes (Signed)
CSN: [id number]     Arrival date & time 12/01/12  0201 History     First MD Initiated Contact with Patient 12/01/12 0216     Chief Complaint  Patient presents with  . Insect Bite   (Consider location/radiation/quality/duration/timing/severity/associated sxs/prior Treatment) HPI Comments: Patient presents with redness, pain itching to his right side. He was seen yesterday and treated for cellulitis from possible bug bite. He is received one dose of clindamycin. He returns tonight with worsening pain itching he believes the redness is spreading. Denies any fever or vomiting. Denies abdominal pain. Believes he was bitten by a fire ant 2 nights ago. No tick exposure.  The history is provided by the patient.    Past Medical History  Diagnosis Date  . Environmental allergies    Past Surgical History  Procedure Laterality Date  . Joint replacement    . Appendectomy    . Knee surgery     No family history on file. History  Substance Use Topics  . Smoking status: Never Smoker   . Smokeless tobacco: Not on file  . Alcohol Use: Yes     Comment: socially    Review of Systems  Constitutional: Negative for fever, activity change and appetite change.  HENT: Negative for congestion and rhinorrhea.   Respiratory: Negative for cough, chest tightness and shortness of breath.   Cardiovascular: Negative for chest pain.  Gastrointestinal: Negative for nausea, vomiting and abdominal pain.  Genitourinary: Negative for dysuria and hematuria.  Musculoskeletal: Negative for back pain.  Skin: Positive for rash and wound.  Neurological: Negative for dizziness, weakness and headaches.  A complete 10 system review of systems was obtained and all systems are negative except as noted in the HPI and PMH.    Allergies  Review of patient's allergies indicates no known allergies.  Home Medications   Current Outpatient Rx  Name  Route  Sig  Dispense  Refill  . clindamycin (CLEOCIN) 300 MG capsule  Oral   Take 1 capsule (300 mg total) by mouth 4 (four) times daily. X 7 days   28 capsule   0   . guaiFENesin-codeine (ROBITUSSIN AC) 100-10 MG/5ML syrup   Oral   Take 5 mLs by mouth 3 (three) times daily as needed for cough.   120 mL   0   . HYDROcodone-acetaminophen (NORCO/VICODIN) 5-325 MG per tablet   Oral   Take 2 tablets by mouth every 4 (four) hours as needed for pain.   10 tablet   0   . hydrOXYzine (ATARAX/VISTARIL) 25 MG tablet   Oral   Take 1 tablet (25 mg total) by mouth every 6 (six) hours.   12 tablet   0   . ibuprofen (ADVIL,MOTRIN) 800 MG tablet   Oral   Take 800 mg by mouth every 8 (eight) hours as needed for pain.          BP 130/98  Pulse 69  Temp(Src) 98 F (36.7 C) (Oral)  Resp 18  SpO2 98% Physical Exam  Constitutional: He is oriented to person, place, and time. He appears well-developed and well-nourished. No distress.  HENT:  Head: Normocephalic.  Mouth/Throat: Oropharynx is clear and moist. No oropharyngeal exudate.  Eyes: Conjunctivae and EOM are normal. Pupils are equal, round, and reactive to light.  Neck: Normal range of motion. Neck supple.  Cardiovascular: Normal rate, regular rhythm and normal heart sounds.   No murmur heard. Pulmonary/Chest: Effort normal and breath sounds normal. No respiratory distress.  Abdominal:  Soft. There is no tenderness. There is no rebound and no guarding.  Musculoskeletal: Normal range of motion.  Neurological: He is alert and oriented to person, place, and time. No cranial nerve deficit. He exhibits normal muscle tone. Coordination normal.  Skin: Skin is warm. There is erythema.     7x9 cm area of erythema and induration. No fluctuance. No drainage    ED Course   Procedures (including critical care time)  Labs Reviewed - No data to display No results found. 1. Cellulitis     MDM  Cellulitis from possible bug bite. Seen 13 hours ago for the same. Has only had one dose of antibiotics. No  systemic symptoms.  Area of infection marked. No fluid collection seen on bedside ultrasound. Patient treated for pain and itching. Instructed to continue antibiotics and followup with his doctor. Return to the ED sooner if redness spreads beyond marked borders, develops fever, chills, streaking erythema or any other concerns.  EMERGENCY DEPARTMENT US SOFT TISSUE INTERPRETATION "Study: Limited Ultrasound of the noted body part in comments below"  INDICATIONS: Soft tissue infection Multiple views of the body part are obtained with a multi-frequency linear probe  PERFORMED BY:  Myself  IMAGES ARCHIVED?: No  SIDE:Right   BODY PART:Abdominal wall  FINDINGS: No abcess noted and Cellulitis present  LIMITATIONS:  Emergent Procedure  INTERPRETATION:  No abcess noted and Cellulitis present  COMMENT:     Glynn Octave, MD 12/01/12 [phone]

## 2012-12-01 NOTE — ED Notes (Signed)
Patient here with redness and continued pain to right side. Seen yesterday and started on antibiotics and benadryl. Has taken 1 dose. Concerned that the redness appears to be spreading

## 2013-01-04 DIAGNOSIS — T6391XA Toxic effect of contact with unspecified venomous animal, accidental (unintentional), initial encounter: Secondary | ICD-10-CM | POA: Insufficient documentation

## 2013-01-04 DIAGNOSIS — Y929 Unspecified place or not applicable: Secondary | ICD-10-CM | POA: Insufficient documentation

## 2013-01-04 DIAGNOSIS — Z792 Long term (current) use of antibiotics: Secondary | ICD-10-CM | POA: Insufficient documentation

## 2013-01-04 DIAGNOSIS — Y939 Activity, unspecified: Secondary | ICD-10-CM | POA: Insufficient documentation

## 2013-01-04 DIAGNOSIS — T63461A Toxic effect of venom of wasps, accidental (unintentional), initial encounter: Secondary | ICD-10-CM | POA: Insufficient documentation

## 2013-01-05 ENCOUNTER — Encounter (HOSPITAL_BASED_OUTPATIENT_CLINIC_OR_DEPARTMENT_OTHER): Payer: Self-pay | Admitting: Emergency Medicine

## 2013-01-05 ENCOUNTER — Emergency Department (HOSPITAL_BASED_OUTPATIENT_CLINIC_OR_DEPARTMENT_OTHER)
Admission: EM | Admit: 2013-01-05 | Discharge: 2013-01-05 | Disposition: A | Discharge status: Home / Self Care | Payer: Non-veteran care | Attending: Emergency Medicine | Admitting: Emergency Medicine

## 2013-01-05 MED ORDER — ACETAMINOPHEN 500 MG PO TABS
ORAL_TABLET | ORAL | Status: AC
  Administered 2013-01-05: 1000 mg
  Filled 2013-01-05: qty 2

## 2013-01-05 MED ORDER — FAMOTIDINE 20 MG PO TABS
20.0000 mg | ORAL_TABLET | Freq: Once | ORAL | Status: AC
  Administered 2013-01-05: 20 mg via ORAL
  Filled 2013-01-05: qty 1

## 2013-01-05 MED ORDER — PREDNISONE 50 MG PO TABS: ORAL_TABLET | ORAL | Status: DC

## 2013-01-05 MED ORDER — EPINEPHRINE 0.3 MG/0.3ML IJ SOAJ: 0.3000 mg | INTRAMUSCULAR | Status: DC | PRN

## 2013-01-05 MED ORDER — FAMOTIDINE 20 MG PO TABS: 20.0000 mg | ORAL_TABLET | Freq: Two times a day (BID) | ORAL | Status: DC

## 2013-01-05 MED ORDER — DIPHENHYDRAMINE HCL 25 MG PO CAPS
25.0000 mg | ORAL_CAPSULE | Freq: Once | ORAL | Status: AC
  Administered 2013-01-05: 25 mg via ORAL
  Filled 2013-01-05: qty 1

## 2013-01-05 MED ORDER — PREDNISONE 50 MG PO TABS
60.0000 mg | ORAL_TABLET | Freq: Once | ORAL | Status: AC
  Administered 2013-01-05: 60 mg via ORAL
  Filled 2013-01-05: qty 1

## 2013-01-05 NOTE — ED Notes (Signed)
pty reports he was stung by bee's on Left upper arm and Left leg

## 2013-01-05 NOTE — ED Provider Notes (Signed)
CSN: [id number]     Arrival date & time 01/04/13  2355 History  This chart was scribed for Craig Lopez Smitty Cords, MD by Ronal Fear, ED Scribe. This patient was seen in room MH09/MH09 and the patient's care was started at 12:22 AM.    Chief Complaint  Patient presents with  . Insect Bite    Patient is a 52 y.o. male presenting with animal bite. The history is provided by the patient. No language interpreter was used.  Animal Bite Contact animal:  Insect Location:  Leg and shoulder/arm Shoulder/arm injury location:  L arm and L upper arm Leg injury location:  L leg Time since incident:  2 days Pain details:    Quality:  Itching   Severity:  Mild   Timing:  Constant   Progression:  Worsening Incident location:  Outside Notifications:  None Relieved by:  OTC medications Worsened by:  Nothing tried Ineffective treatments:  OTC medications Associated symptoms: no fever    HPI Comments: Craig Lopez is a 52 y.o. male who presents to the Emergency Department complaining of 2 insect bites on the left thigh and arm Friday afternoon that occurred while he was , he has taken benedryl  Past Medical History  Diagnosis Date  . Environmental allergies    Past Surgical History  Procedure Laterality Date  . Joint replacement    . Appendectomy    . Knee surgery     History reviewed. No pertinent family history. History  Substance Use Topics  . Smoking status: Never Smoker   . Smokeless tobacco: Not on file  . Alcohol Use: Yes     Comment: socially    Review of Systems  Constitutional: Negative for fever.  Skin:       Insect sting  All other systems reviewed and are negative.    Allergies  Review of patient's allergies indicates no known allergies.  Home Medications   Current Outpatient Rx  Name  Route  Sig  Dispense  Refill  . clindamycin (CLEOCIN) 300 MG capsule   Oral   Take 1 capsule (300 mg total) by mouth 4 (four) times daily. X 7 days   28 capsule   0   .  guaiFENesin-codeine (ROBITUSSIN AC) 100-10 MG/5ML syrup   Oral   Take 5 mLs by mouth 3 (three) times daily as needed for cough.   120 mL   0   . HYDROcodone-acetaminophen (NORCO/VICODIN) 5-325 MG per tablet   Oral   Take 2 tablets by mouth every 4 (four) hours as needed for pain.   10 tablet   0   . hydrOXYzine (ATARAX/VISTARIL) 25 MG tablet   Oral   Take 1 tablet (25 mg total) by mouth every 6 (six) hours.   12 tablet   0   . ibuprofen (ADVIL,MOTRIN) 800 MG tablet   Oral   Take 800 mg by mouth every 8 (eight) hours as needed for pain.          BP 116/75  Temp(Src) 98.3 F (36.8 C) (Oral)  Resp 20  Ht 6\' 1"  (1.854 m)  Wt 289 lb (131.09 kg)  BMI 38.14 kg/m2  SpO2 99% Physical Exam  Nursing note and vitals reviewed. Constitutional: He is oriented to person, place, and time. He appears well-developed and well-nourished. No distress.  HENT:  Head: Normocephalic and atraumatic.  Mouth/Throat: Oropharynx is clear and moist.  No swelling of Lips, tongue, or uvula. Trachea is midline  Eyes: EOM are normal. Pupils  are equal, round, and reactive to light.  Neck: Normal range of motion. Neck supple. No tracheal deviation present.  Cardiovascular: Normal rate and intact distal pulses.   Pulmonary/Chest: Effort normal and breath sounds normal. No stridor. No respiratory distress. He has no wheezes. He has no rales.  No stridor  Abdominal: Soft. There is no tenderness. There is no rebound and no guarding.  Musculoskeletal: Normal range of motion.  Neurological: He is alert and oriented to person, place, and time.  Skin: Skin is warm and dry.  No stingers remaining just areas of induration  Psychiatric: He has a normal mood and affect. His behavior is normal.    ED Course  Procedures (including critical care time)  DIAGNOSTIC STUDIES: Oxygen Saturation is 99% on RA, normal by my interpretation.    COORDINATION OF CARE: 12:21 AM- Pt advised of plan for treatment including  administeing pepsid, benadryl and another antihistamine and pt agrees.      Labs Review Labs Reviewed - No data to display Imaging Review No results found.  MDM  No diagnosis found. Will treat with pepcid steroids benadryl and prescribe epi pen follow up with your PMD Monday, return for swelling of the lips tongue    [place] Mielke K Torian Quintero-Rasch, MD 01/05/13 [phone]

## 2015-08-03 ENCOUNTER — Encounter: Payer: Self-pay | Admitting: Allergy and Immunology

## 2015-08-03 ENCOUNTER — Ambulatory Visit (INDEPENDENT_AMBULATORY_CARE_PROVIDER_SITE_OTHER): Payer: No Typology Code available for payment source | Admitting: Allergy and Immunology

## 2015-08-03 VITALS — BP 142/84 | HR 80 | Temp 97.8°F | Resp 18 | Ht 74.02 in | Wt 291.9 lb

## 2015-08-03 DIAGNOSIS — J309 Allergic rhinitis, unspecified: Secondary | ICD-10-CM

## 2015-08-03 DIAGNOSIS — H1010 Acute atopic conjunctivitis, unspecified eye: Secondary | ICD-10-CM | POA: Diagnosis not present

## 2015-08-03 MED ORDER — METHYLPREDNISOLONE ACETATE 80 MG/ML IJ SUSP
80.0000 mg | Freq: Once | INTRAMUSCULAR | Status: AC
  Administered 2015-08-03: 80 mg via INTRAMUSCULAR

## 2015-08-03 MED ORDER — EPINEPHRINE 0.3 MG/0.3ML IJ SOAJ: INTRAMUSCULAR | Status: AC

## 2015-08-03 MED ORDER — BEPOTASTINE BESILATE 1.5 % OP SOLN: OPHTHALMIC | Status: AC

## 2015-08-03 NOTE — Patient Instructions (Addendum)
  1. Allergen avoidance measures  2. Treat and prevent inflammation:   A. fluticasone nasal spray - 1-2 sprays each nostril one time per day  B. montelukast 10 mg one tablet one time per day  C. Depo-Medrol 80 IM delivered in clinic today  3. If needed:   A. loratadine or cetirizine 10 mg one tablet one time per day  B. Bepreve 1 drop each eye twice a day. Coupon.  4. Consider a course of immunotherapy  5. Return to clinic in 4 weeks or earlier if problem

## 2015-08-03 NOTE — Progress Notes (Signed)
Dear Dr. Maryellen Pile,  Thank you for referring Craig Lopez to the [facility] [place] on 08/03/2015.   Below is a summation of this patient's evaluation and recommendations.  Thank you for your referral. I will keep you informed about this patient's response to treatment.   If you have any questions please to do hestitate to contact me.   Sincerely,  Jessica Priest, MD  [facility]   ______________________________________________________________________    NEW PATIENT NOTE  Referring Provider: Pernell Dupre, MD Primary Provider: Lynne Leader Date of office visit: 08/03/2015    Subjective:   Chief Complaint:  Craig Lopez (DOB: 1961-04-08) is a 55 y.o. male with a chief complaint of Allergic Rhinitis  and Nasal Congestion  who presents to the clinic on 08/03/2015 with the following problems:  HPI: Craig Lopez presents this clinic in evaluation of allergic disease. He has a long history of allergic rhinoconjunctivitis dating back to about the age of 74 or so manifested as very significant nasal congestion and sneezing and itchy red watery eyes occurring on a perennial basis with severe springtime flare but some involvement throughout the whole growing season. Exposure to grass and cats really exacerbate his problem. All his symptoms continue to be an active issue even though he has consistently been using montelukast and nasal fluticasone and an antihistamine on a daily basis. He did undergo a course of immunotherapy in the past which did help him but because of logistical issue was difficult for him to continue this form of therapy.  Past Medical History  Diagnosis Date  . Environmental allergies   . Knee problem   . Back pain     Past Surgical History  Procedure Laterality Date  . Joint replacement    . Appendectomy    . Knee surgery        Medication List           CARBOXYMETHYLCELLULOSE SODIUM OP  Apply to eye as needed.     fluticasone 50 MCG/ACT nasal spray  Commonly known as:  FLONASE  Place 1 spray into both nostrils 2 (two) times daily.     ibuprofen 800 MG tablet  Commonly known as:  ADVIL,MOTRIN  Take 800 mg by mouth every 8 (eight) hours as needed for pain.     loratadine 10 MG tablet  Commonly known as:  CLARITIN  Take 10 mg by mouth daily.     montelukast 10 MG tablet  Commonly known as:  SINGULAIR  Take 10 mg by mouth at bedtime.     omeprazole 20 MG capsule  Commonly known as:  PRILOSEC  Take 20 mg by mouth daily.        No Known Allergies  Review of systems negative except as noted in HPI / PMHx or noted below:  Review of Systems  Constitutional: Negative.   HENT: Negative.   Eyes: Negative.   Respiratory: Negative.   Cardiovascular: Negative.   Gastrointestinal: Negative.   Genitourinary: Negative.   Musculoskeletal: Negative.   Skin: Negative.   Neurological: Negative.   Endo/Heme/Allergies: Negative.   Psychiatric/Behavioral: Negative.     No family history on file.  Social History   Social History  . Marital Status: Married    Spouse Name: N/A  . Number of Children: N/A  . Years of Education: N/A   Occupational History  . Not on file.   Social History Main  Topics  . Smoking status: Never Smoker   . Smokeless tobacco: Never Used  . Alcohol Use: Yes     Comment: socially  . Drug Use: No  . Sexual Activity: No   Other Topics Concern  . Not on file   Social History Narrative    Environmental and Social history  Lives in a apartment with a dry environment, no animals located inside the household, carpeting in the bedroom, no plastic on the bed or pillow, and no smoking ongoing with inside the household.   Objective:   Filed Vitals:   08/03/15 1425  BP: 142/84  Pulse: 80  Temp: 97.8 F (36.6 C)  Resp: 18        Physical Exam  Constitutional: He is well-developed,  well-nourished, and in no distress.  Constant nose rubbing and eye rubbing, constant sneezing and sniffing  HENT:  Head: Normocephalic. Head is without right periorbital erythema and without left periorbital erythema.  Right Ear: Tympanic membrane, external ear and ear canal normal.  Left Ear: Tympanic membrane, external ear and ear canal normal.  Nose: Mucosal edema present. No rhinorrhea.  Mouth/Throat: Oropharynx is clear and moist and mucous membranes are normal. No oropharyngeal exudate.  Eyes: Lids are normal. Pupils are equal, round, and reactive to light. Right conjunctiva is injected. Left conjunctiva is injected.  Neck: Trachea normal. No tracheal deviation present. No thyromegaly present.  Cardiovascular: Normal rate, regular rhythm, S1 normal, S2 normal and normal heart sounds.   No murmur heard. Pulmonary/Chest: Effort normal. No stridor. No tachypnea. No respiratory distress. He has no wheezes. He has no rales. He exhibits no tenderness.  Abdominal: Soft. He exhibits no distension and no mass. There is no hepatosplenomegaly. There is no tenderness. There is no rebound and no guarding.  Musculoskeletal: He exhibits no edema or tenderness.  Lymphadenopathy:       Head (right side): No tonsillar adenopathy present.       Head (left side): No tonsillar adenopathy present.    He has no cervical adenopathy.    He has no axillary adenopathy.  Neurological: He is alert. Gait normal.  Skin: No rash noted. He is not diaphoretic. No erythema. No pallor. Nails show no clubbing.  Psychiatric: Mood and affect normal.     Diagnostics: Allergy skin tests were performed. He demonstrated hypersensitivity against grasses, weeds, trees, and house dust mite. He also had very slight sensitivity against dog.   Assessment and Plan:    1. Allergic rhinoconjunctivitis     1. Allergen avoidance measures  2. Treat and prevent inflammation:   A. fluticasone nasal spray - 1-2 sprays each  nostril one time per day  B. montelukast 10 mg one tablet one time per day  C. Depo-Medrol 80 IM delivered in clinic today  3. If needed:   A. loratadine or cetirizine 10 mg one tablet one time per day  B. Bepreve 1 drop each eye twice a day. Coupon.  4. Consider a course of immunotherapy  5. Return to clinic in 4 weeks or earlier if problem   Craig Lopez has basically failed medical therapy directed against his allergic rhinoconjunctivitis and he should consider a course of immunotherapy which will probably give him long-term benefit directed against his atopic disease. I did give him a systemic steroid today to help him through his springtime flare. I will regroup with him in approximately 4 weeks and will make a determination about how to proceed pending his response.  Jessica PriestEric J. Kozlow, MD  [facility] [place]

## 2015-08-10 ENCOUNTER — Ambulatory Visit: Payer: Self-pay | Admitting: Allergy and Immunology

## 2015-08-23 ENCOUNTER — Emergency Department (HOSPITAL_COMMUNITY): Payer: Non-veteran care

## 2015-08-23 ENCOUNTER — Emergency Department (HOSPITAL_COMMUNITY)
Admission: EM | Admit: 2015-08-23 | Discharge: 2015-08-23 | Disposition: A | Discharge status: Home / Self Care | Payer: Non-veteran care | Attending: Emergency Medicine | Admitting: Emergency Medicine

## 2015-08-23 ENCOUNTER — Encounter (HOSPITAL_COMMUNITY): Payer: Self-pay

## 2015-08-23 DIAGNOSIS — M25562 Pain in left knee: Secondary | ICD-10-CM | POA: Insufficient documentation

## 2015-08-23 DIAGNOSIS — Z79899 Other long term (current) drug therapy: Secondary | ICD-10-CM | POA: Insufficient documentation

## 2015-08-23 DIAGNOSIS — Z791 Long term (current) use of non-steroidal anti-inflammatories (NSAID): Secondary | ICD-10-CM | POA: Insufficient documentation

## 2015-08-23 DIAGNOSIS — W19XXXA Unspecified fall, initial encounter: Secondary | ICD-10-CM

## 2015-08-23 DIAGNOSIS — Z7951 Long term (current) use of inhaled steroids: Secondary | ICD-10-CM | POA: Diagnosis not present

## 2015-08-23 DIAGNOSIS — M542 Cervicalgia: Secondary | ICD-10-CM | POA: Insufficient documentation

## 2015-08-23 DIAGNOSIS — M25561 Pain in right knee: Secondary | ICD-10-CM

## 2015-08-23 DIAGNOSIS — M25461 Effusion, right knee: Secondary | ICD-10-CM | POA: Diagnosis not present

## 2015-08-23 DIAGNOSIS — M2540 Effusion, unspecified joint: Secondary | ICD-10-CM

## 2015-08-23 MED ORDER — IBUPROFEN 800 MG PO TABS
800.0000 mg | ORAL_TABLET | Freq: Once | ORAL | Status: AC
  Administered 2015-08-23: 800 mg via ORAL
  Filled 2015-08-23: qty 1

## 2015-08-23 MED ORDER — ACETAMINOPHEN 325 MG PO TABS
650.0000 mg | ORAL_TABLET | Freq: Once | ORAL | Status: AC
Start: 2015-08-23 — End: 2015-08-23
  Administered 2015-08-23: 650 mg via ORAL
  Filled 2015-08-23: qty 2

## 2015-08-23 MED ORDER — CYCLOBENZAPRINE HCL 10 MG PO TABS: 10.0000 mg | ORAL_TABLET | Freq: Every day | ORAL | Status: DC
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
## 2015-08-23 NOTE — Discharge Instructions (Signed)
Knee Effusion °Knee effusion means that you have excess fluid in your knee joint. This can cause pain and swelling in your knee. This may make your knee more difficult to bend and move. That is because there is increased pain and pressure in the joint. If there is fluid in your knee, it often means that something is wrong inside your knee, such as severe arthritis, abnormal inflammation, or an infection. Another common cause of knee effusion is an injury to the knee muscles, ligaments, or cartilage. °HOME CARE INSTRUCTIONS °· Use crutches as directed by your health care provider. °· Wear a knee brace as directed by your health care provider. °· Apply ice to the swollen area: °¨ Put ice in a plastic bag. °¨ Place a towel between your skin and the bag. °¨ Leave the ice on for 20 minutes, 2-3 times per day. °· Keep your knee raised (elevated) when you are sitting or lying down. °· Take medicines only as directed by your health care provider. °· Do any rehabilitation or strengthening exercises as directed by your health care provider. °· Rest your knee as directed by your health care provider. You may start doing your normal activities again when your health care provider approves.    °· Keep all follow-up visits as directed by your health care provider. This is important. °SEEK MEDICAL CARE IF: °· You have ongoing (persistent) pain in your knee. °SEEK IMMEDIATE MEDICAL CARE IF: °· You have increased swelling or redness of your knee. °· You have severe pain in your knee. °· You have a fever. °  °This information is not intended to replace advice given to you by your health care provider. Make sure you discuss any questions you have with your health care provider. °  °Document Released: 06/24/2003 Document Revised: 04/24/2014 Document Reviewed: 11/17/2013 °[employer]vier Interactive Patient Education ©2016 [employer] ° °
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
## 2015-08-24 ENCOUNTER — Ambulatory Visit: Payer: No Typology Code available for payment source | Admitting: Allergy and Immunology

## 2015-09-08 ENCOUNTER — Ambulatory Visit (INDEPENDENT_AMBULATORY_CARE_PROVIDER_SITE_OTHER): Payer: No Typology Code available for payment source | Admitting: Allergy and Immunology

## 2015-09-08 DIAGNOSIS — H1010 Acute atopic conjunctivitis, unspecified eye: Secondary | ICD-10-CM

## 2015-09-08 DIAGNOSIS — J309 Allergic rhinitis, unspecified: Secondary | ICD-10-CM

## 2015-09-08 NOTE — Patient Instructions (Addendum)
  1. Allergen avoidance measures  2. Treat and prevent inflammation:   A. fluticasone nasal spray - 1-2 sprays each nostril one time per day  B. montelukast 10 mg one tablet one time per day  3. If needed:   A. loratadine or cetirizine 10 mg one tablet one time per day  B. Bepreve 1 drop each eye twice a day. Coupon.  4. Start a course of immunotherapy  5. Return to clinic in 6 months or earlier if problem

## 2015-09-08 NOTE — Progress Notes (Signed)
Follow-up Note  Referring Provider: Charolett BumpersKroner, George K, PA-C Primary Provider: Lynne LeaderKroner, George K, PA-C Date of Office Visit: 09/08/2015  Subjective:   Craig Lopez (DOB: 04/08/1961) is a 55 y.o. male who [facility] in re-evaluation of the following:  HPI: Ed returns to this clinic in reevaluation of his allergic rhinoconjunctivitis that was addressed last month with the use of systemic steroid and the consistent use of a nasal steroid and antihistamine as well as allergen avoidance measures. He is certainly a lot better at this point in time and has much less nasal congestion and sneezing and itchy eyes. However, he clearly understands that his benefit is partially on the basis of using a systemic steroid and as a long-term therapy he would like to start a course of immunotherapy.    Medication List           Bepotastine Besilate 1.5 % Soln  Commonly known as:  BEPREVE  Can use one drop in each eye twice daily if needed for itchy eyes.     CARBOXYMETHYLCELLULOSE SODIUM OP  Apply to eye as needed.     cyclobenzaprine 10 MG tablet  Commonly known as:  FLEXERIL  Take 1 tablet (10 mg total) by mouth at bedtime.     EPINEPHrine 0.3 mg/0.3 mL Soaj injection  Commonly known as:  EPI-PEN  Use as directed for life-threatening allergic reaction.     fluticasone 50 MCG/ACT nasal spray  Commonly known as:  FLONASE  Place 1 spray into both nostrils 2 (two) times daily.     ibuprofen 800 MG tablet  Commonly known as:  ADVIL,MOTRIN  Take 800 mg by mouth every 8 (eight) hours as needed for pain.     loratadine 10 MG tablet  Commonly known as:  CLARITIN  Take 10 mg by mouth daily.     montelukast 10 MG tablet  Commonly known as:  SINGULAIR  Take 10 mg by mouth at bedtime.     omeprazole 20 MG capsule  Commonly known as:  PRILOSEC  Take 20 mg by mouth daily.     PRESCRIPTION MEDICATION  Prescription Muscle Relaxer     traZODone 50  MG tablet  Commonly known as:  DESYREL  Take 50 mg by mouth at bedtime.        Past Medical History  Diagnosis Date  . Environmental allergies   . Knee problem   . Back pain     Past Surgical History  Procedure Laterality Date  . Appendectomy    . Knee surgery Left     Multiple Knee surgeries   . Wisdom tooth extraction      No Known Allergies  Review of systems negative except as noted in HPI / PMHx or noted below:  Review of Systems  Constitutional: Negative.   HENT: Negative.   Eyes: Negative.   Respiratory: Negative.   Cardiovascular: Negative.   Gastrointestinal: Negative.   Genitourinary: Negative.   Musculoskeletal: Negative.   Skin: Negative.   Neurological: Negative.   Endo/Heme/Allergies: Negative.   Psychiatric/Behavioral: Negative.      Objective:   There were no vitals filed for this visit.        Physical Exam  Constitutional: He is well-developed, well-nourished, and in no distress.  HENT:  Head: Normocephalic.  Right Ear: Tympanic membrane, external ear and ear canal normal.  Left Ear: Tympanic membrane, external ear and ear canal normal.  Nose: Nose normal.  No mucosal edema or rhinorrhea.  Mouth/Throat: Uvula is midline, oropharynx is clear and moist and mucous membranes are normal. No oropharyngeal exudate.  Eyes: Conjunctivae are normal.  Neck: Trachea normal. No tracheal tenderness present. No tracheal deviation present. No thyromegaly present.  Cardiovascular: Normal rate, regular rhythm, S1 normal, S2 normal and normal heart sounds.   No murmur heard. Pulmonary/Chest: Breath sounds normal. No stridor. No respiratory distress. He has no wheezes. He has no rales.  Musculoskeletal: He exhibits no edema.  Lymphadenopathy:       Head (right side): No tonsillar adenopathy present.       Head (left side): No tonsillar adenopathy present.    He has no cervical adenopathy.  Neurological: He is alert. Gait normal.  Skin: No rash noted. He  is not diaphoretic. No erythema. Nails show no clubbing.  Psychiatric: Mood and affect normal.    Diagnostics: None     Assessment and Plan:   1. Allergic rhinoconjunctivitis     1. Allergen avoidance measures  2. Treat and prevent inflammation:   A. fluticasone nasal spray - 1-2 sprays each nostril one time per day  B. montelukast 10 mg one tablet one time per day  3. If needed:   A. loratadine or cetirizine 10 mg one tablet one time per day  B. Bepreve 1 drop each eye twice a day. Coupon.  4. Consider a course of immunotherapy  5. Return to clinic in 6 months or earlier if problem  Ed will be starting a course of immunotherapy sometime in the near future and will continue to have him use anti-inflammatory medications for his respiratory tract as stated above. I will see him back in this clinic in 6 months or earlier if there is a problem. He may have an opportunity to go to [place] for approximately 3 months sometime this late fall and obviously will need to adjust his immunotherapy if that event is become reality.  Laurette Schimke, MD [facility]

## 2015-09-09 ENCOUNTER — Other Ambulatory Visit: Payer: Self-pay | Admitting: Allergy and Immunology

## 2015-09-09 DIAGNOSIS — H1010 Acute atopic conjunctivitis, unspecified eye: Secondary | ICD-10-CM

## 2015-09-09 DIAGNOSIS — J3089 Other allergic rhinitis: Secondary | ICD-10-CM | POA: Diagnosis not present

## 2015-09-09 DIAGNOSIS — J309 Allergic rhinitis, unspecified: Principal | ICD-10-CM

## 2015-09-10 DIAGNOSIS — J301 Allergic rhinitis due to pollen: Secondary | ICD-10-CM | POA: Diagnosis not present

## 2015-10-01 ENCOUNTER — Ambulatory Visit (INDEPENDENT_AMBULATORY_CARE_PROVIDER_SITE_OTHER): Payer: No Typology Code available for payment source | Admitting: *Deleted

## 2015-10-01 DIAGNOSIS — J309 Allergic rhinitis, unspecified: Secondary | ICD-10-CM

## 2015-10-01 NOTE — Progress Notes (Signed)
Immunotherapy   Patient Details  Name: Craig Lopez MRN: [id number] Date of Birth: 27-Jan-1961  10/01/2015  Craig AlimentEdwin E Buckholtz started injections for  GRASS-TREE/DMITE-WEED Following schedule: B  Frequency:2 times per week Epi-Pen:Epi-Pen Available  Consent signed and patient instructions given.   Bennye AlmMildred Dondi Burandt 10/01/2015, 10:13 AM

## 2015-10-04 ENCOUNTER — Ambulatory Visit (INDEPENDENT_AMBULATORY_CARE_PROVIDER_SITE_OTHER): Payer: No Typology Code available for payment source

## 2015-10-04 DIAGNOSIS — J309 Allergic rhinitis, unspecified: Secondary | ICD-10-CM

## 2015-10-20 ENCOUNTER — Ambulatory Visit (INDEPENDENT_AMBULATORY_CARE_PROVIDER_SITE_OTHER): Payer: Non-veteran care

## 2015-10-20 DIAGNOSIS — J309 Allergic rhinitis, unspecified: Secondary | ICD-10-CM | POA: Diagnosis not present

## 2015-10-21 NOTE — Progress Notes (Signed)
This encounter was created in error - please disregard.

## 2015-10-26 ENCOUNTER — Ambulatory Visit (INDEPENDENT_AMBULATORY_CARE_PROVIDER_SITE_OTHER): Payer: Non-veteran care

## 2015-10-26 DIAGNOSIS — J309 Allergic rhinitis, unspecified: Secondary | ICD-10-CM | POA: Diagnosis not present

## 2015-10-28 ENCOUNTER — Ambulatory Visit (INDEPENDENT_AMBULATORY_CARE_PROVIDER_SITE_OTHER): Payer: Non-veteran care

## 2015-10-28 DIAGNOSIS — J309 Allergic rhinitis, unspecified: Secondary | ICD-10-CM | POA: Diagnosis not present

## 2015-11-02 ENCOUNTER — Ambulatory Visit (INDEPENDENT_AMBULATORY_CARE_PROVIDER_SITE_OTHER): Payer: Non-veteran care | Admitting: *Deleted

## 2015-11-02 DIAGNOSIS — J309 Allergic rhinitis, unspecified: Secondary | ICD-10-CM

## 2015-11-04 ENCOUNTER — Ambulatory Visit (INDEPENDENT_AMBULATORY_CARE_PROVIDER_SITE_OTHER): Payer: Non-veteran care

## 2015-11-04 DIAGNOSIS — J309 Allergic rhinitis, unspecified: Secondary | ICD-10-CM

## 2015-11-11 ENCOUNTER — Ambulatory Visit (INDEPENDENT_AMBULATORY_CARE_PROVIDER_SITE_OTHER): Payer: Non-veteran care

## 2015-11-11 DIAGNOSIS — J309 Allergic rhinitis, unspecified: Secondary | ICD-10-CM | POA: Diagnosis not present

## 2015-11-17 ENCOUNTER — Ambulatory Visit (INDEPENDENT_AMBULATORY_CARE_PROVIDER_SITE_OTHER): Payer: Non-veteran care

## 2015-11-17 DIAGNOSIS — J309 Allergic rhinitis, unspecified: Secondary | ICD-10-CM | POA: Diagnosis not present

## 2015-11-25 ENCOUNTER — Ambulatory Visit (INDEPENDENT_AMBULATORY_CARE_PROVIDER_SITE_OTHER): Payer: Non-veteran care | Admitting: *Deleted

## 2015-11-25 DIAGNOSIS — J309 Allergic rhinitis, unspecified: Secondary | ICD-10-CM | POA: Diagnosis not present

## 2015-12-01 ENCOUNTER — Ambulatory Visit (INDEPENDENT_AMBULATORY_CARE_PROVIDER_SITE_OTHER): Payer: Non-veteran care

## 2015-12-01 DIAGNOSIS — J309 Allergic rhinitis, unspecified: Secondary | ICD-10-CM | POA: Diagnosis not present

## 2015-12-08 ENCOUNTER — Ambulatory Visit (INDEPENDENT_AMBULATORY_CARE_PROVIDER_SITE_OTHER): Payer: Non-veteran care | Admitting: *Deleted

## 2015-12-08 DIAGNOSIS — J309 Allergic rhinitis, unspecified: Secondary | ICD-10-CM

## 2015-12-13 ENCOUNTER — Ambulatory Visit (INDEPENDENT_AMBULATORY_CARE_PROVIDER_SITE_OTHER): Payer: Non-veteran care

## 2015-12-13 DIAGNOSIS — J309 Allergic rhinitis, unspecified: Secondary | ICD-10-CM

## 2015-12-15 ENCOUNTER — Ambulatory Visit (INDEPENDENT_AMBULATORY_CARE_PROVIDER_SITE_OTHER): Payer: Non-veteran care | Admitting: *Deleted

## 2015-12-15 DIAGNOSIS — J309 Allergic rhinitis, unspecified: Secondary | ICD-10-CM | POA: Diagnosis not present

## 2015-12-21 ENCOUNTER — Ambulatory Visit (INDEPENDENT_AMBULATORY_CARE_PROVIDER_SITE_OTHER): Payer: Non-veteran care

## 2015-12-21 DIAGNOSIS — J309 Allergic rhinitis, unspecified: Secondary | ICD-10-CM | POA: Diagnosis not present

## 2015-12-23 ENCOUNTER — Ambulatory Visit (INDEPENDENT_AMBULATORY_CARE_PROVIDER_SITE_OTHER): Payer: Non-veteran care

## 2015-12-23 DIAGNOSIS — J309 Allergic rhinitis, unspecified: Secondary | ICD-10-CM | POA: Diagnosis not present

## 2015-12-28 ENCOUNTER — Ambulatory Visit (INDEPENDENT_AMBULATORY_CARE_PROVIDER_SITE_OTHER): Payer: Non-veteran care | Admitting: *Deleted

## 2015-12-28 DIAGNOSIS — J309 Allergic rhinitis, unspecified: Secondary | ICD-10-CM | POA: Diagnosis not present

## 2015-12-30 ENCOUNTER — Ambulatory Visit (INDEPENDENT_AMBULATORY_CARE_PROVIDER_SITE_OTHER): Payer: Non-veteran care | Admitting: *Deleted

## 2015-12-30 DIAGNOSIS — J309 Allergic rhinitis, unspecified: Secondary | ICD-10-CM

## 2016-01-04 ENCOUNTER — Ambulatory Visit (INDEPENDENT_AMBULATORY_CARE_PROVIDER_SITE_OTHER): Payer: Non-veteran care

## 2016-01-04 DIAGNOSIS — J309 Allergic rhinitis, unspecified: Secondary | ICD-10-CM

## 2016-01-07 ENCOUNTER — Ambulatory Visit (INDEPENDENT_AMBULATORY_CARE_PROVIDER_SITE_OTHER): Payer: Non-veteran care

## 2016-01-07 DIAGNOSIS — J309 Allergic rhinitis, unspecified: Secondary | ICD-10-CM | POA: Diagnosis not present

## 2016-01-10 ENCOUNTER — Ambulatory Visit (INDEPENDENT_AMBULATORY_CARE_PROVIDER_SITE_OTHER): Payer: Non-veteran care | Admitting: *Deleted

## 2016-01-10 DIAGNOSIS — J309 Allergic rhinitis, unspecified: Secondary | ICD-10-CM

## 2016-01-17 ENCOUNTER — Ambulatory Visit (INDEPENDENT_AMBULATORY_CARE_PROVIDER_SITE_OTHER): Payer: Non-veteran care

## 2016-01-17 DIAGNOSIS — H1010 Acute atopic conjunctivitis, unspecified eye: Secondary | ICD-10-CM

## 2016-01-17 DIAGNOSIS — J309 Allergic rhinitis, unspecified: Secondary | ICD-10-CM

## 2016-01-24 ENCOUNTER — Ambulatory Visit (INDEPENDENT_AMBULATORY_CARE_PROVIDER_SITE_OTHER): Payer: Non-veteran care | Admitting: *Deleted

## 2016-01-24 DIAGNOSIS — J309 Allergic rhinitis, unspecified: Secondary | ICD-10-CM

## 2016-01-25 DIAGNOSIS — J301 Allergic rhinitis due to pollen: Secondary | ICD-10-CM | POA: Diagnosis not present

## 2016-01-26 DIAGNOSIS — J3089 Other allergic rhinitis: Secondary | ICD-10-CM | POA: Diagnosis not present

## 2016-01-31 ENCOUNTER — Ambulatory Visit (INDEPENDENT_AMBULATORY_CARE_PROVIDER_SITE_OTHER): Payer: Non-veteran care | Admitting: *Deleted

## 2016-01-31 DIAGNOSIS — J309 Allergic rhinitis, unspecified: Secondary | ICD-10-CM | POA: Diagnosis not present

## 2016-02-15 ENCOUNTER — Emergency Department (HOSPITAL_COMMUNITY)
Admission: EM | Admit: 2016-02-15 | Discharge: 2016-02-15 | Disposition: A | Discharge status: Home / Self Care | Payer: Non-veteran care | Attending: Emergency Medicine | Admitting: Emergency Medicine

## 2016-02-15 ENCOUNTER — Encounter (HOSPITAL_COMMUNITY): Payer: Self-pay | Admitting: Emergency Medicine

## 2016-02-15 DIAGNOSIS — M545 Low back pain: Secondary | ICD-10-CM | POA: Insufficient documentation

## 2016-02-15 DIAGNOSIS — G8929 Other chronic pain: Secondary | ICD-10-CM | POA: Diagnosis not present

## 2016-02-15 DIAGNOSIS — Z79899 Other long term (current) drug therapy: Secondary | ICD-10-CM | POA: Insufficient documentation

## 2016-02-15 MED ORDER — PREDNISONE 10 MG PO TABS: 20.0000 mg | ORAL_TABLET | Freq: Two times a day (BID) | ORAL | 0 refills | Status: AC

## 2016-02-15 MED ORDER — LIDOCAINE 5 % EX PTCH: 1.0000 | MEDICATED_PATCH | CUTANEOUS | 0 refills | Status: DC

## 2016-02-15 NOTE — Discharge Instructions (Signed)
Take it easy, but do not lay around too much as this may make the stiffness worse. Take 500 mg of naproxen every 12 hours or 800 mg of ibuprofen every 8 hours for the next 3 days. Take these medications with food to avoid upset stomach. Continue to use the Flexeril. Add the course of prednisone.

## 2016-02-15 NOTE — ED Triage Notes (Signed)
Pt reports right sided lower back pain x2 days. Denies injury, bowel/bladder changes and numbness/tingling. Ambulatory to triage.

## 2016-02-15 NOTE — ED Provider Notes (Signed)
[facility]-EMERGENCY DEPT Provider Note   CSN: [id number] Arrival date & time: 02/15/16  2125     History   Chief Complaint Chief Complaint  Patient presents with  . Back Pain    HPI Craig Lopez is a 55 y.o. male.  HPI   Craig Lopez is a 55 y.o. male, with a history of chronic lower back pain, presenting to the ED with acute on chronic right lower back pain beginning 4 days ago. Pain is aching, moderate, radiating into the right buttock. Patient has intermittently tried ibuprofen and Flexeril. Denies changes in bowel or bladder function, neuro deficits, trauma, or any other complaints.   Past Medical History:  Diagnosis Date  . Back pain   . Environmental allergies   . Knee problem     There are no active problems to display for this patient.   Past Surgical History:  Procedure Laterality Date  . APPENDECTOMY    . KNEE SURGERY Left    Multiple Knee surgeries   . WISDOM TOOTH EXTRACTION         Home Medications    Prior to Admission medications   Medication Sig Start Date End Date Taking? Authorizing Provider  Bepotastine Besilate (BEPREVE) 1.5 % SOLN Can use one drop in each eye twice daily if needed for itchy eyes. 08/03/15   Jessica PriestEric J Kozlow, MD  CARBOXYMETHYLCELLULOSE SODIUM OP Apply to eye as needed.    Historical Provider, MD  cyclobenzaprine (FLEXERIL) 10 MG tablet Take 1 tablet (10 mg total) by mouth at bedtime. 08/23/15   Cheri FowlerKayla Rose, PA-C  EPINEPHrine 0.3 mg/0.3 mL IJ SOAJ injection Use as directed for life-threatening allergic reaction. 08/03/15   Jessica PriestEric J Kozlow, MD  fluticasone (FLONASE) 50 MCG/ACT nasal spray Place 1 spray into both nostrils 2 (two) times daily.    Historical Provider, MD  ibuprofen (ADVIL,MOTRIN) 800 MG tablet Take 800 mg by mouth every 8 (eight) hours as needed for pain.    Historical Provider, MD  lidocaine (LIDODERM) 5 % Place 1 patch onto the skin daily. Remove & Discard patch within 12 hours or as directed by MD 02/15/16   Anselm PancoastShawn C Danthony Kendrix,  PA-C  loratadine (CLARITIN) 10 MG tablet Take 10 mg by mouth daily.    Historical Provider, MD  montelukast (SINGULAIR) 10 MG tablet Take 10 mg by mouth at bedtime.    Historical Provider, MD  omeprazole (PRILOSEC) 20 MG capsule Take 20 mg by mouth daily.    Historical Provider, MD  predniSONE (DELTASONE) 10 MG tablet Take 2 tablets (20 mg total) by mouth 2 (two) times daily with a meal. 02/15/16 02/20/16  Anselm PancoastShawn C Elisa Kutner, PA-C  PRESCRIPTION MEDICATION Prescription Muscle Relaxer    Historical Provider, MD  traZODone (DESYREL) 50 MG tablet Take 50 mg by mouth at bedtime.    Historical Provider, MD    Family History Family History  Problem Relation Age of Onset  . Congestive Heart Failure Mother   . Lung cancer Father     He was a smoker.  . Congestive Heart Failure Sister   . Colon cancer Brother   . Congestive Heart Failure Sister     Social History Social History  Substance Use Topics  . Smoking status: Never Smoker  . Smokeless tobacco: Never Used  . Alcohol use Yes     Comment: socially     Allergies   Review of patient's allergies indicates no known allergies.   Review of Systems Review of Systems  Constitutional:  Negative for chills and fever.  Gastrointestinal: Negative for abdominal pain, nausea and vomiting.  Genitourinary: Negative for dysuria and flank pain.  Musculoskeletal: Positive for back pain.  Neurological: Negative for weakness and numbness.  All other systems reviewed and are negative.    Physical Exam Updated Vital Signs BP 138/99 (BP Location: Right Arm)   Pulse 71   Temp 98 F (36.7 C) (Oral)   Resp 17   Ht 6\' 1"  (1.854 m)   Wt 123.4 kg   SpO2 97%   BMI 35.89 kg/m   Physical Exam  Constitutional: He appears well-developed and well-nourished. No distress.  HENT:  Head: Normocephalic and atraumatic.  Eyes: Conjunctivae are normal.  Neck: Neck supple.  Cardiovascular: Normal rate, regular rhythm and intact distal pulses.     Pulmonary/Chest: Effort normal. No respiratory distress.  Abdominal: There is no guarding.  Musculoskeletal: He exhibits no edema.  Tenderness to the right lumbar musculature extending into the right buttock. Motor intact in all extremities and spine. No midline spinal tenderness.   Lymphadenopathy:    He has no cervical adenopathy.  Neurological: He is alert.  No sensory deficits. Strength 5/5 in all extremities. No gait disturbance. Coordination intact.   Skin: Skin is warm and dry. He is not diaphoretic.  Psychiatric: He has a normal mood and affect. His behavior is normal.  Nursing note and vitals reviewed.    ED Treatments / Results  Labs (all labs ordered are listed, but only abnormal results are displayed) Labs Reviewed - No data to display  EKG  EKG Interpretation None       Radiology No results found.  Procedures Procedures (including critical care time)  Medications Ordered in ED Medications - No data to display   Initial Impression / Assessment and Plan / ED Course  I have reviewed the triage vital signs and the nursing notes.  Pertinent labs & imaging results that were available during my care of the patient were reviewed by me and considered in my medical decision making (see chart for details).  Clinical Course    Patient presents with acute on chronic lower back pain. Possibly accompanied by some sciatica symptoms. I will add prednisone to the patient's regimen of Flexeril and heating pads. PCP follow-up. Return precautions discussed.   Final Clinical Impressions(s) / ED Diagnoses   Final diagnoses:  Chronic right-sided low back pain, with sciatica presence unspecified    New Prescriptions Discharge Medication List as of 02/15/2016 11:34 PM    START taking these medications   Details  lidocaine (LIDODERM) 5 % Place 1 patch onto the skin daily. Remove & Discard patch within 12 hours or as directed by MD, Starting Tue 02/15/2016, Print     predniSONE (DELTASONE) 10 MG tablet Take 2 tablets (20 mg total) by mouth 2 (two) times daily with a meal., Starting Tue 02/15/2016, Until Sun 02/20/2016, Print         Anselm PancoastShawn C Fifi Schindler, PA-C 02/16/16 0056    Shaune Pollackameron Isaacs, MD 02/16/16 2044

## 2016-02-18 ENCOUNTER — Ambulatory Visit (INDEPENDENT_AMBULATORY_CARE_PROVIDER_SITE_OTHER): Payer: Non-veteran care

## 2016-02-18 DIAGNOSIS — J309 Allergic rhinitis, unspecified: Secondary | ICD-10-CM | POA: Diagnosis not present

## 2016-02-21 ENCOUNTER — Ambulatory Visit (INDEPENDENT_AMBULATORY_CARE_PROVIDER_SITE_OTHER): Payer: Non-veteran care | Admitting: *Deleted

## 2016-02-21 DIAGNOSIS — J309 Allergic rhinitis, unspecified: Secondary | ICD-10-CM | POA: Diagnosis not present

## 2016-02-29 ENCOUNTER — Ambulatory Visit (INDEPENDENT_AMBULATORY_CARE_PROVIDER_SITE_OTHER): Payer: Non-veteran care | Admitting: *Deleted

## 2016-02-29 DIAGNOSIS — J309 Allergic rhinitis, unspecified: Secondary | ICD-10-CM

## 2016-03-08 ENCOUNTER — Ambulatory Visit (INDEPENDENT_AMBULATORY_CARE_PROVIDER_SITE_OTHER): Payer: Non-veteran care | Admitting: *Deleted

## 2016-03-08 DIAGNOSIS — J309 Allergic rhinitis, unspecified: Secondary | ICD-10-CM

## 2016-03-14 ENCOUNTER — Ambulatory Visit: Payer: Non-veteran care | Admitting: Allergy and Immunology

## 2016-03-14 ENCOUNTER — Ambulatory Visit (INDEPENDENT_AMBULATORY_CARE_PROVIDER_SITE_OTHER): Payer: Non-veteran care | Admitting: *Deleted

## 2016-03-14 DIAGNOSIS — J309 Allergic rhinitis, unspecified: Secondary | ICD-10-CM

## 2016-03-22 ENCOUNTER — Ambulatory Visit (INDEPENDENT_AMBULATORY_CARE_PROVIDER_SITE_OTHER): Payer: Non-veteran care | Admitting: *Deleted

## 2016-03-22 DIAGNOSIS — J309 Allergic rhinitis, unspecified: Secondary | ICD-10-CM | POA: Diagnosis not present

## 2016-03-31 ENCOUNTER — Ambulatory Visit (INDEPENDENT_AMBULATORY_CARE_PROVIDER_SITE_OTHER): Payer: Non-veteran care | Admitting: *Deleted

## 2016-03-31 DIAGNOSIS — J309 Allergic rhinitis, unspecified: Secondary | ICD-10-CM

## 2016-04-21 ENCOUNTER — Ambulatory Visit (INDEPENDENT_AMBULATORY_CARE_PROVIDER_SITE_OTHER): Payer: Non-veteran care

## 2016-04-21 DIAGNOSIS — J309 Allergic rhinitis, unspecified: Secondary | ICD-10-CM

## 2016-05-02 ENCOUNTER — Ambulatory Visit (INDEPENDENT_AMBULATORY_CARE_PROVIDER_SITE_OTHER): Payer: Non-veteran care | Admitting: Allergy and Immunology

## 2016-05-02 ENCOUNTER — Encounter: Payer: Self-pay | Admitting: Allergy and Immunology

## 2016-05-02 ENCOUNTER — Encounter (INDEPENDENT_AMBULATORY_CARE_PROVIDER_SITE_OTHER): Payer: Self-pay

## 2016-05-02 VITALS — BP 132/96 | HR 84 | Resp 20

## 2016-05-02 DIAGNOSIS — J3089 Other allergic rhinitis: Secondary | ICD-10-CM

## 2016-05-02 NOTE — Progress Notes (Signed)
Follow-up Note  Referring Provider: Charolett BumpersKroner, George K, PA-C Primary Provider: Lynne LeaderKroner, George K, PA-C Date of Office Visit: 05/02/2016  Subjective:   Craig Lopez (DOB: November 27, 1960) is a 56 y.o. male [facility] in re-evaluation of the following:  HPI: Ramon Dredgedward returns to this clinic in reevaluation of his allergic rhinoconjunctivitis presently treated with immunotherapy. I last saw him in this clinic in May 2017.  His immunotherapy is going quite well. He was out to every 2 week use but unfortunately because of logistic issue missed a few injections and is now back down to one time per week. His upper airways and eye issues are doing quite well. He has not required a systemic steroid or an antibiotic to treat any type of respiratory tract issue. He does use montelukast every day and occasionally nasal fluticasone and occasionally an eyedrop.  Ed did obtain the flu vaccine this year.  Allergies as of 05/02/2016   No Known Allergies     Medication List      Bepotastine Besilate 1.5 % Soln Commonly known as:  BEPREVE Can use one drop in each eye twice daily if needed for itchy eyes.   CARBOXYMETHYLCELLULOSE SODIUM OP Apply to eye as needed.   cyclobenzaprine 10 MG tablet Commonly known as:  FLEXERIL Take 1 tablet (10 mg total) by mouth at bedtime.   EPINEPHrine 0.3 mg/0.3 mL Soaj injection Commonly known as:  EPI-PEN Use as directed for life-threatening allergic reaction.   fluticasone 50 MCG/ACT nasal spray Commonly known as:  FLONASE Place 1 spray into both nostrils 2 (two) times daily.   ibuprofen 800 MG tablet Commonly known as:  ADVIL,MOTRIN Take 800 mg by mouth every 8 (eight) hours as needed for pain.   loratadine 10 MG tablet Commonly known as:  CLARITIN Take 10 mg by mouth daily.   montelukast 10 MG tablet Commonly known as:  SINGULAIR Take 10 mg by mouth at bedtime.   omeprazole 20 MG capsule Commonly  known as:  PRILOSEC Take 20 mg by mouth daily.   traZODone 50 MG tablet Commonly known as:  DESYREL Take 50 mg by mouth at bedtime.   venlafaxine 100 MG tablet Commonly known as:  EFFEXOR Take 100 mg by mouth 2 (two) times daily.       Past Medical History:  Diagnosis Date  . Back pain   . Environmental allergies   . Knee problem     Past Surgical History:  Procedure Laterality Date  . APPENDECTOMY    . KNEE SURGERY Left    Multiple Knee surgeries   . WISDOM TOOTH EXTRACTION      Review of systems negative except as noted in HPI / PMHx or noted below:  Review of Systems  Constitutional: Negative.   HENT: Negative.   Eyes: Negative.   Respiratory: Negative.   Cardiovascular: Negative.   Gastrointestinal: Negative.   Genitourinary: Negative.   Musculoskeletal: Negative.   Skin: Negative.   Neurological: Negative.   Endo/Heme/Allergies: Negative.   Psychiatric/Behavioral: Negative.      Objective:   Vitals:   05/02/16 1135  BP: (!) 132/96  Pulse: 84  Resp: 20          Physical Exam  Constitutional: He is well-developed, well-nourished, and in no distress.  HENT:  Head: Normocephalic.  Right Ear: Tympanic membrane, external ear and ear canal normal.  Left Ear: Tympanic membrane, external ear and ear canal normal.  Nose:  Nose normal. No mucosal edema or rhinorrhea.  Mouth/Throat: Uvula is midline, oropharynx is clear and moist and mucous membranes are normal. No oropharyngeal exudate.  Eyes: Conjunctivae are normal.  Neck: Trachea normal. No tracheal tenderness present. No tracheal deviation present. No thyromegaly present.  Cardiovascular: Normal rate, regular rhythm, S1 normal, S2 normal and normal heart sounds.   No murmur heard. Pulmonary/Chest: Breath sounds normal. No stridor. No respiratory distress. He has no wheezes. He has no rales.  Musculoskeletal: He exhibits no edema.  Lymphadenopathy:       Head (right side): No tonsillar adenopathy  present.       Head (left side): No tonsillar adenopathy present.    He has no cervical adenopathy.  Neurological: He is alert. Gait normal.  Skin: No rash noted. He is not diaphoretic. No erythema. Nails show no clubbing.  Psychiatric: Mood and affect normal.    Diagnostics: none   Assessment and Plan:   1. Other allergic rhinitis     1. Allergen avoidance measures  2. Treat and prevent inflammation:   A. fluticasone nasal spray - 1-2 sprays each nostril 3-7 times per week  B. montelukast 10 mg one tablet one time per day  3. If needed:   A. loratadine or cetirizine 10 mg one tablet one time per day  B. Bepreve 1 drop each eye twice a day.    4. Continue immunotherapy and EpiPen  5. Return to clinic in 12 months or earlier if problem  Hopefully Ed will receive protection against spring time allergic rhinoconjunctivitis over the course the next several months while utilizing a course of immunotherapy. He will continue to use anti-inflammatory medications for his respiratory tract and if needed other symptomatic medications as noted above. I'll see him back in this clinic in 12 months or earlier if there is a problem.  Laurette Schimke, MD [facility]

## 2016-05-02 NOTE — Patient Instructions (Signed)
  1. Allergen avoidance measures  2. Treat and prevent inflammation:   A. fluticasone nasal spray - 1-2 sprays each nostril 3-7 times per week  B. montelukast 10 mg one tablet one time per day  3. If needed:   A. loratadine or cetirizine 10 mg one tablet one time per day  B. Bepreve 1 drop each eye twice a day.    4. Continue immunotherapy and EpiPen  5. Return to clinic in 12 months or earlier if problem

## 2016-05-08 ENCOUNTER — Ambulatory Visit (INDEPENDENT_AMBULATORY_CARE_PROVIDER_SITE_OTHER): Payer: Non-veteran care

## 2016-05-08 DIAGNOSIS — J3089 Other allergic rhinitis: Secondary | ICD-10-CM | POA: Diagnosis not present

## 2016-05-17 ENCOUNTER — Ambulatory Visit (INDEPENDENT_AMBULATORY_CARE_PROVIDER_SITE_OTHER): Payer: Non-veteran care | Admitting: *Deleted

## 2016-05-17 DIAGNOSIS — J309 Allergic rhinitis, unspecified: Secondary | ICD-10-CM | POA: Diagnosis not present

## 2016-06-05 ENCOUNTER — Ambulatory Visit (INDEPENDENT_AMBULATORY_CARE_PROVIDER_SITE_OTHER): Payer: Non-veteran care

## 2016-06-05 DIAGNOSIS — J309 Allergic rhinitis, unspecified: Secondary | ICD-10-CM

## 2016-06-12 ENCOUNTER — Ambulatory Visit (INDEPENDENT_AMBULATORY_CARE_PROVIDER_SITE_OTHER): Payer: Non-veteran care | Admitting: *Deleted

## 2016-06-12 DIAGNOSIS — J309 Allergic rhinitis, unspecified: Secondary | ICD-10-CM | POA: Diagnosis not present

## 2016-06-21 ENCOUNTER — Ambulatory Visit (INDEPENDENT_AMBULATORY_CARE_PROVIDER_SITE_OTHER): Payer: Non-veteran care | Admitting: *Deleted

## 2016-06-21 DIAGNOSIS — J309 Allergic rhinitis, unspecified: Secondary | ICD-10-CM

## 2016-07-04 ENCOUNTER — Ambulatory Visit (INDEPENDENT_AMBULATORY_CARE_PROVIDER_SITE_OTHER): Payer: Non-veteran care | Admitting: *Deleted

## 2016-07-04 DIAGNOSIS — J309 Allergic rhinitis, unspecified: Secondary | ICD-10-CM

## 2016-07-06 DIAGNOSIS — J301 Allergic rhinitis due to pollen: Secondary | ICD-10-CM | POA: Diagnosis not present

## 2016-07-07 DIAGNOSIS — J3089 Other allergic rhinitis: Secondary | ICD-10-CM | POA: Diagnosis not present

## 2016-07-21 ENCOUNTER — Ambulatory Visit (INDEPENDENT_AMBULATORY_CARE_PROVIDER_SITE_OTHER): Payer: Non-veteran care

## 2016-07-21 DIAGNOSIS — J309 Allergic rhinitis, unspecified: Secondary | ICD-10-CM

## 2016-08-09 ENCOUNTER — Ambulatory Visit (INDEPENDENT_AMBULATORY_CARE_PROVIDER_SITE_OTHER): Payer: Non-veteran care | Admitting: *Deleted

## 2016-08-09 DIAGNOSIS — J309 Allergic rhinitis, unspecified: Secondary | ICD-10-CM

## 2016-08-22 ENCOUNTER — Ambulatory Visit (INDEPENDENT_AMBULATORY_CARE_PROVIDER_SITE_OTHER): Payer: Non-veteran care | Admitting: *Deleted

## 2016-08-22 DIAGNOSIS — J309 Allergic rhinitis, unspecified: Secondary | ICD-10-CM

## 2016-08-28 ENCOUNTER — Ambulatory Visit (INDEPENDENT_AMBULATORY_CARE_PROVIDER_SITE_OTHER): Payer: Non-veteran care

## 2016-08-28 DIAGNOSIS — J309 Allergic rhinitis, unspecified: Secondary | ICD-10-CM

## 2016-09-06 ENCOUNTER — Ambulatory Visit (INDEPENDENT_AMBULATORY_CARE_PROVIDER_SITE_OTHER): Payer: Non-veteran care | Admitting: *Deleted

## 2016-09-06 DIAGNOSIS — J309 Allergic rhinitis, unspecified: Secondary | ICD-10-CM

## 2016-09-12 ENCOUNTER — Ambulatory Visit (INDEPENDENT_AMBULATORY_CARE_PROVIDER_SITE_OTHER): Payer: Non-veteran care

## 2016-09-12 DIAGNOSIS — J309 Allergic rhinitis, unspecified: Secondary | ICD-10-CM

## 2016-09-19 ENCOUNTER — Ambulatory Visit (INDEPENDENT_AMBULATORY_CARE_PROVIDER_SITE_OTHER): Payer: Non-veteran care | Admitting: *Deleted

## 2016-09-19 DIAGNOSIS — J309 Allergic rhinitis, unspecified: Secondary | ICD-10-CM

## 2016-10-05 ENCOUNTER — Ambulatory Visit (INDEPENDENT_AMBULATORY_CARE_PROVIDER_SITE_OTHER): Payer: Non-veteran care | Admitting: *Deleted

## 2016-10-05 DIAGNOSIS — J309 Allergic rhinitis, unspecified: Secondary | ICD-10-CM

## 2016-10-31 ENCOUNTER — Ambulatory Visit (INDEPENDENT_AMBULATORY_CARE_PROVIDER_SITE_OTHER): Payer: Non-veteran care | Admitting: *Deleted

## 2016-10-31 DIAGNOSIS — J309 Allergic rhinitis, unspecified: Secondary | ICD-10-CM

## 2016-11-01 ENCOUNTER — Telehealth: Payer: Self-pay

## 2016-11-01 NOTE — Telephone Encounter (Signed)
Patients [facility] referral has expired. I have sent an email to one [facility] to request more visits. Will also call the patient to inform him of this.

## 2016-11-01 NOTE — Telephone Encounter (Signed)
[facility] [place] [facility]  Hi! I just alerted [place] [facility] Provider that he needs a new consult. I would also encourage [facility] and ask for a new consult for care.  Thank you!

## 2016-11-07 NOTE — Telephone Encounter (Signed)
Called and left another voicemail today for the patient. Still haven't heard anything back from the [place] or the patient.

## 2016-11-15 NOTE — Progress Notes (Signed)
VIALS EXP 11-15-17 

## 2016-11-16 ENCOUNTER — Ambulatory Visit (INDEPENDENT_AMBULATORY_CARE_PROVIDER_SITE_OTHER): Payer: Non-veteran care

## 2016-11-16 DIAGNOSIS — J309 Allergic rhinitis, unspecified: Secondary | ICD-10-CM | POA: Diagnosis not present
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
## 2016-11-22 DIAGNOSIS — J301 Allergic rhinitis due to pollen: Secondary | ICD-10-CM | POA: Diagnosis not present

## 2016-11-23 DIAGNOSIS — J301 Allergic rhinitis due to pollen: Secondary | ICD-10-CM | POA: Diagnosis not present

## 2016-11-30 ENCOUNTER — Ambulatory Visit (INDEPENDENT_AMBULATORY_CARE_PROVIDER_SITE_OTHER): Payer: Non-veteran care | Admitting: *Deleted

## 2016-11-30 DIAGNOSIS — J309 Allergic rhinitis, unspecified: Secondary | ICD-10-CM

## 2016-12-22 ENCOUNTER — Ambulatory Visit (INDEPENDENT_AMBULATORY_CARE_PROVIDER_SITE_OTHER): Payer: Non-veteran care

## 2016-12-22 DIAGNOSIS — J309 Allergic rhinitis, unspecified: Secondary | ICD-10-CM
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
## 2017-01-12 ENCOUNTER — Ambulatory Visit (INDEPENDENT_AMBULATORY_CARE_PROVIDER_SITE_OTHER): Payer: Non-veteran care

## 2017-01-12 DIAGNOSIS — J309 Allergic rhinitis, unspecified: Secondary | ICD-10-CM

## 2017-01-24 ENCOUNTER — Ambulatory Visit (INDEPENDENT_AMBULATORY_CARE_PROVIDER_SITE_OTHER): Payer: Non-veteran care | Admitting: *Deleted

## 2017-01-24 DIAGNOSIS — J309 Allergic rhinitis, unspecified: Secondary | ICD-10-CM

## 2017-01-31 ENCOUNTER — Ambulatory Visit (INDEPENDENT_AMBULATORY_CARE_PROVIDER_SITE_OTHER): Payer: Non-veteran care | Admitting: *Deleted

## 2017-01-31 DIAGNOSIS — J309 Allergic rhinitis, unspecified: Secondary | ICD-10-CM

## 2017-02-01 ENCOUNTER — Encounter (HOSPITAL_BASED_OUTPATIENT_CLINIC_OR_DEPARTMENT_OTHER): Payer: Self-pay | Admitting: *Deleted

## 2017-02-01 DIAGNOSIS — Y92410 Unspecified street and highway as the place of occurrence of the external cause: Secondary | ICD-10-CM | POA: Insufficient documentation

## 2017-02-01 DIAGNOSIS — M542 Cervicalgia: Secondary | ICD-10-CM | POA: Insufficient documentation

## 2017-02-01 DIAGNOSIS — Y9389 Activity, other specified: Secondary | ICD-10-CM | POA: Diagnosis not present

## 2017-02-01 DIAGNOSIS — Y998 Other external cause status: Secondary | ICD-10-CM | POA: Insufficient documentation

## 2017-02-01 DIAGNOSIS — Z79899 Other long term (current) drug therapy: Secondary | ICD-10-CM | POA: Diagnosis not present

## 2017-02-01 DIAGNOSIS — R51 Headache: Secondary | ICD-10-CM | POA: Insufficient documentation
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
## 2017-02-02 ENCOUNTER — Emergency Department (HOSPITAL_BASED_OUTPATIENT_CLINIC_OR_DEPARTMENT_OTHER): Payer: Non-veteran care

## 2017-02-02 ENCOUNTER — Emergency Department (HOSPITAL_BASED_OUTPATIENT_CLINIC_OR_DEPARTMENT_OTHER)
Admission: EM | Admit: 2017-02-02 | Discharge: 2017-02-02 | Disposition: A | Discharge status: Home / Self Care | Payer: Non-veteran care | Attending: Emergency Medicine | Admitting: Emergency Medicine

## 2017-02-02 DIAGNOSIS — M542 Cervicalgia: Secondary | ICD-10-CM | POA: Diagnosis not present

## 2017-02-02 MED ORDER — ACETAMINOPHEN 500 MG PO TABS
1000.0000 mg | ORAL_TABLET | Freq: Once | ORAL | Status: AC
  Administered 2017-02-02: 1000 mg via ORAL
  Filled 2017-02-02: qty 2

## 2017-02-02 MED ORDER — NAPROXEN 375 MG PO TABS: 375.0000 mg | ORAL_TABLET | Freq: Two times a day (BID) | ORAL | 0 refills | Status: DC

## 2017-02-02 MED ORDER — METAXALONE 800 MG PO TABS: 800.0000 mg | ORAL_TABLET | Freq: Three times a day (TID) | ORAL | 0 refills | Status: DC

## 2017-02-02 MED ORDER — IBUPROFEN 800 MG PO TABS
800.0000 mg | ORAL_TABLET | Freq: Once | ORAL | Status: AC
  Administered 2017-02-02: 800 mg via ORAL
  Filled 2017-02-02: qty 1
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
## 2017-02-08 ENCOUNTER — Ambulatory Visit (INDEPENDENT_AMBULATORY_CARE_PROVIDER_SITE_OTHER): Payer: Non-veteran care | Admitting: *Deleted

## 2017-02-08 DIAGNOSIS — J309 Allergic rhinitis, unspecified: Secondary | ICD-10-CM

## 2017-02-21 ENCOUNTER — Ambulatory Visit (INDEPENDENT_AMBULATORY_CARE_PROVIDER_SITE_OTHER): Payer: Non-veteran care

## 2017-02-21 DIAGNOSIS — J309 Allergic rhinitis, unspecified: Secondary | ICD-10-CM

## 2017-03-14 ENCOUNTER — Ambulatory Visit (INDEPENDENT_AMBULATORY_CARE_PROVIDER_SITE_OTHER): Payer: Non-veteran care

## 2017-03-14 DIAGNOSIS — J309 Allergic rhinitis, unspecified: Secondary | ICD-10-CM | POA: Diagnosis not present

## 2017-03-27 ENCOUNTER — Ambulatory Visit (INDEPENDENT_AMBULATORY_CARE_PROVIDER_SITE_OTHER): Payer: Non-veteran care | Admitting: *Deleted

## 2017-03-27 DIAGNOSIS — J309 Allergic rhinitis, unspecified: Secondary | ICD-10-CM

## 2017-04-18 ENCOUNTER — Ambulatory Visit (INDEPENDENT_AMBULATORY_CARE_PROVIDER_SITE_OTHER): Payer: Non-veteran care | Admitting: *Deleted

## 2017-04-18 DIAGNOSIS — J309 Allergic rhinitis, unspecified: Secondary | ICD-10-CM

## 2017-04-19 DIAGNOSIS — J3089 Other allergic rhinitis: Secondary | ICD-10-CM | POA: Diagnosis not present

## 2017-04-19 NOTE — Progress Notes (Signed)
VIALS EXP 04-20-18 

## 2017-04-20 DIAGNOSIS — J301 Allergic rhinitis due to pollen: Secondary | ICD-10-CM | POA: Diagnosis not present

## 2017-05-08 ENCOUNTER — Ambulatory Visit (INDEPENDENT_AMBULATORY_CARE_PROVIDER_SITE_OTHER): Payer: Non-veteran care | Admitting: *Deleted

## 2017-05-08 DIAGNOSIS — J309 Allergic rhinitis, unspecified: Secondary | ICD-10-CM | POA: Diagnosis not present

## 2017-05-30 ENCOUNTER — Ambulatory Visit (INDEPENDENT_AMBULATORY_CARE_PROVIDER_SITE_OTHER): Payer: Non-veteran care

## 2017-05-30 DIAGNOSIS — J309 Allergic rhinitis, unspecified: Secondary | ICD-10-CM | POA: Diagnosis not present

## 2017-06-25 ENCOUNTER — Ambulatory Visit (INDEPENDENT_AMBULATORY_CARE_PROVIDER_SITE_OTHER): Payer: Non-veteran care | Admitting: *Deleted

## 2017-06-25 DIAGNOSIS — J309 Allergic rhinitis, unspecified: Secondary | ICD-10-CM | POA: Diagnosis not present

## 2017-07-03 ENCOUNTER — Ambulatory Visit (INDEPENDENT_AMBULATORY_CARE_PROVIDER_SITE_OTHER): Payer: Non-veteran care | Admitting: Allergy and Immunology

## 2017-07-03 ENCOUNTER — Encounter: Payer: Self-pay | Admitting: Allergy and Immunology

## 2017-07-03 VITALS — BP 150/96 | HR 68 | Resp 16

## 2017-07-03 DIAGNOSIS — J3089 Other allergic rhinitis: Secondary | ICD-10-CM | POA: Diagnosis not present

## 2017-07-03 NOTE — Progress Notes (Signed)
Follow-up Note  Referring Provider: Charolett BumpersKroner, George K, PA-C Primary Provider: Lynne LeaderKroner, George K, PA-C Date of Office Visit: 07/03/2017  Subjective:   Craig AlimentEdwin E Lopez (DOB: 05/08/1960) is a 57 y.o. male who [facility] in re-evaluation of the following:  HPI: Craig Russelldwin returns to this clinic in reevaluation of his allergic rhinoconjunctivitis.  I last saw him in this clinic 02 May 2016.  He has really done very well on his current therapy which includes immunotherapy.  In fact, he no longer uses any nasal steroids or leukotriene modifier on a regular basis.  He has not required a systemic steroid or antibiotic to treat any type of respiratory tract issue over the course of the past year.  He continues on immunotherapy currently at every 3 weeks without any adverse effects.  He did receive the flu vaccine this year.  Allergies as of 07/03/2017   No Known Allergies     Medication List      Bepotastine Besilate 1.5 % Soln Commonly known as:  BEPREVE Can use one drop in each eye twice daily if needed for itchy eyes.   CARBOXYMETHYLCELLULOSE SODIUM OP Apply to eye as needed.   cyclobenzaprine 10 MG tablet Commonly known as:  FLEXERIL Take 10 mg by mouth 3 (three) times daily as needed for muscle spasms.   EPINEPHrine 0.3 mg/0.3 mL Soaj injection Commonly known as:  EPI-PEN Use as directed for life-threatening allergic reaction.   fluticasone 50 MCG/ACT nasal spray Commonly known as:  FLONASE Place 1 spray into both nostrils daily.   ibuprofen 800 MG tablet Commonly known as:  ADVIL,MOTRIN Take 800 mg by mouth every 8 (eight) hours as needed for pain.   loratadine 10 MG tablet Commonly known as:  CLARITIN Take 10 mg by mouth daily.   metaxalone 800 MG tablet Commonly known as:  SKELAXIN Take 1 tablet (800 mg total) by mouth 3 (three) times daily.   montelukast 10 MG tablet Commonly known as:  SINGULAIR Take 10 mg by mouth  at bedtime.   naproxen 375 MG tablet Commonly known as:  NAPROSYN Take 1 tablet (375 mg total) by mouth 2 (two) times daily.   OXYCODONE HCL PO Take by mouth.   venlafaxine 100 MG tablet Commonly known as:  EFFEXOR Take 100 mg by mouth 2 (two) times daily.       Past Medical History:  Diagnosis Date  . Back pain   . Environmental allergies   . Knee problem     Past Surgical History:  Procedure Laterality Date  . APPENDECTOMY    . KNEE SURGERY Left    Multiple Knee surgeries   . WISDOM TOOTH EXTRACTION      Review of systems negative except as noted in HPI / PMHx or noted below:  Review of Systems  Constitutional: Negative.   HENT: Negative.   Eyes: Negative.   Respiratory: Negative.   Cardiovascular: Negative.   Gastrointestinal: Negative.   Genitourinary: Negative.   Musculoskeletal: Negative.   Skin: Negative.   Neurological: Negative.   Endo/Heme/Allergies: Negative.   Psychiatric/Behavioral: Negative.      Objective:   Vitals:   07/03/17 0951  BP: (!) 150/96  Pulse: 68  Resp: 16          Physical Exam  Constitutional: He is well-developed, well-nourished, and in no distress.  HENT:  Head: Normocephalic.  Right Ear: Tympanic membrane, external ear and ear canal normal.  Left Ear:  Tympanic membrane, external ear and ear canal normal.  Nose: Nose normal. No mucosal edema or rhinorrhea.  Mouth/Throat: Uvula is midline, oropharynx is clear and moist and mucous membranes are normal. No oropharyngeal exudate.  Eyes: Conjunctivae are normal.  Neck: Trachea normal. No tracheal tenderness present. No tracheal deviation present. No thyromegaly present.  Cardiovascular: Normal rate, regular rhythm, S1 normal, S2 normal and normal heart sounds.  No murmur heard. Pulmonary/Chest: Breath sounds normal. No stridor. No respiratory distress. He has no wheezes. He has no rales.  Musculoskeletal: He exhibits no edema.  Lymphadenopathy:       Head (right  side): No tonsillar adenopathy present.       Head (left side): No tonsillar adenopathy present.    He has no cervical adenopathy.  Neurological: He is alert. Gait normal.  Skin: No rash noted. He is not diaphoretic. No erythema. Nails show no clubbing.  Psychiatric: Mood and affect normal.    Diagnostics: none  Assessment and Plan:   1. Other allergic rhinitis     1.  Continue to perform allergen avoidance measures  2.  Continue to treat and prevent inflammation during periods of upper airway symptoms:   A. fluticasone nasal spray - 1-2 sprays each nostril 3-7 times per week  B. montelukast 10 mg one tablet one time per day  3. If needed:   A. loratadine or cetirizine 10 mg one tablet one time per day  B. Bepreve 1 drop each eye twice a day.    4. Continue immunotherapy and EpiPen  5. Return to clinic in 12 months or earlier if problem  Craig Lopez is really doing quite well with his immunotherapy.  It has made a dramatic improvement regarding his eye and nose issues.  He still can use a nasal steroid and a leukotriene modifier should he develop problems as he goes through each season of the year in addition to continuing on his immunotherapy.  I will see him back in this clinic in 1 year or earlier if there is a problem.  Laurette Schimke, MD Allergy / Immunology [facility]

## 2017-07-03 NOTE — Patient Instructions (Addendum)
  1.  Continue to perform allergen avoidance measures  2.  Continue to treat and prevent inflammation during periods of upper airway symptoms:   A. fluticasone nasal spray - 1-2 sprays each nostril 3-7 times per week  B. montelukast 10 mg one tablet one time per day  3. If needed:   A. loratadine or cetirizine 10 mg one tablet one time per day  B. Bepreve 1 drop each eye twice a day.    4. Continue immunotherapy and EpiPen  5. Return to clinic in 12 months or earlier if problem

## 2017-07-04 ENCOUNTER — Encounter: Payer: Self-pay | Admitting: Allergy and Immunology

## 2017-07-12 ENCOUNTER — Ambulatory Visit (INDEPENDENT_AMBULATORY_CARE_PROVIDER_SITE_OTHER): Payer: Non-veteran care | Admitting: *Deleted

## 2017-07-12 DIAGNOSIS — J309 Allergic rhinitis, unspecified: Secondary | ICD-10-CM | POA: Diagnosis not present

## 2017-08-01 ENCOUNTER — Ambulatory Visit (INDEPENDENT_AMBULATORY_CARE_PROVIDER_SITE_OTHER): Payer: Non-veteran care | Admitting: *Deleted

## 2017-08-01 DIAGNOSIS — J309 Allergic rhinitis, unspecified: Secondary | ICD-10-CM

## 2017-08-18 ENCOUNTER — Emergency Department (HOSPITAL_COMMUNITY)
Admission: EM | Admit: 2017-08-18 | Discharge: 2017-08-18 | Disposition: A | Discharge status: Home / Self Care | Payer: Non-veteran care | Attending: Emergency Medicine | Admitting: Emergency Medicine

## 2017-08-18 ENCOUNTER — Emergency Department (HOSPITAL_COMMUNITY): Payer: Non-veteran care

## 2017-08-18 ENCOUNTER — Encounter (HOSPITAL_COMMUNITY): Payer: Self-pay | Admitting: *Deleted

## 2017-08-18 ENCOUNTER — Other Ambulatory Visit: Payer: Self-pay

## 2017-08-18 DIAGNOSIS — N200 Calculus of kidney: Secondary | ICD-10-CM | POA: Diagnosis not present

## 2017-08-18 DIAGNOSIS — Z79899 Other long term (current) drug therapy: Secondary | ICD-10-CM | POA: Insufficient documentation

## 2017-08-18 DIAGNOSIS — R1032 Left lower quadrant pain: Secondary | ICD-10-CM | POA: Diagnosis present

## 2017-08-18 LAB — COMPREHENSIVE METABOLIC PANEL
ALT: 27 U/L (ref 17–63)
AST: 27 U/L (ref 15–41)
Albumin: 4.1 g/dL (ref 3.5–5.0)
Alkaline Phosphatase: 62 U/L (ref 38–126)
Anion gap: 6 (ref 5–15)
BILIRUBIN TOTAL: 0.9 mg/dL (ref 0.3–1.2)
BUN: 14 mg/dL (ref 6–20)
CALCIUM: 9 mg/dL (ref 8.9–10.3)
CHLORIDE: 103 mmol/L (ref 101–111)
CO2: 28 mmol/L (ref 22–32)
CREATININE: 1.53 mg/dL — AB (ref 0.61–1.24)
GFR, EST AFRICAN AMERICAN: 58 mL/min — AB (ref >60)
GFR, EST NON AFRICAN AMERICAN: 50 mL/min — AB (ref >60)
Glucose, Bld: 149 mg/dL — ABNORMAL HIGH (ref 65–99)
Potassium: 4 mmol/L (ref 3.5–5.1)
Sodium: 137 mmol/L (ref 135–145)
TOTAL PROTEIN: 7.8 g/dL (ref 6.5–8.1)

## 2017-08-18 LAB — URINALYSIS, ROUTINE W REFLEX MICROSCOPIC
BACTERIA UA: NONE SEEN
Bilirubin Urine: NEGATIVE
GLUCOSE, UA: NEGATIVE mg/dL
KETONES UR: NEGATIVE mg/dL
Leukocytes, UA: NEGATIVE
NITRITE: NEGATIVE
PROTEIN: 30 mg/dL — AB
Specific Gravity, Urine: 1.02 (ref 1.005–1.030)
pH: 6 (ref 5.0–8.0)

## 2017-08-18 LAB — CBC
HEMATOCRIT: 38.8 % — AB (ref 39.0–52.0)
HEMOGLOBIN: 12.5 g/dL — AB (ref 13.0–17.0)
MCH: 25.2 pg — AB (ref 26.0–34.0)
MCHC: 32.2 g/dL (ref 30.0–36.0)
MCV: 78.1 fL (ref 78.0–100.0)
Platelets: 203 10*3/uL (ref 150–400)
RBC: 4.97 MIL/uL (ref 4.22–5.81)
RDW: 14.7 % (ref 11.5–15.5)
WBC: 5.8 10*3/uL (ref 4.0–10.5)

## 2017-08-18 LAB — LIPASE, BLOOD: LIPASE: 41 U/L (ref 11–51)

## 2017-08-18 MED ORDER — ONDANSETRON HCL 4 MG/2ML IJ SOLN
4.0000 mg | Freq: Once | INTRAMUSCULAR | Status: AC
Start: 2017-08-18 — End: 2017-08-18
  Administered 2017-08-18: 4 mg via INTRAVENOUS
  Filled 2017-08-18: qty 2

## 2017-08-18 MED ORDER — SODIUM CHLORIDE 0.9 % IV BOLUS
1000.0000 mL | Freq: Once | INTRAVENOUS | Status: AC
  Administered 2017-08-18: 1000 mL via INTRAVENOUS

## 2017-08-18 MED ORDER — MORPHINE SULFATE (PF) 4 MG/ML IV SOLN
4.0000 mg | Freq: Once | INTRAVENOUS | Status: AC
  Administered 2017-08-18: 4 mg via INTRAVENOUS
  Filled 2017-08-18: qty 1

## 2017-08-18 MED ORDER — OXYCODONE-ACETAMINOPHEN 5-325 MG PO TABS: 1.0000 | ORAL_TABLET | ORAL | 0 refills | Status: DC | PRN

## 2017-08-18 MED ORDER — ONDANSETRON 4 MG PO TBDP: 4.0000 mg | ORAL_TABLET | Freq: Three times a day (TID) | ORAL | 0 refills | Status: DC | PRN

## 2017-08-18 MED ORDER — TAMSULOSIN HCL 0.4 MG PO CAPS: 0.4000 mg | ORAL_CAPSULE | Freq: Every day | ORAL | 0 refills | Status: DC

## 2017-08-18 MED ORDER — HYDROMORPHONE HCL 2 MG/ML IJ SOLN
1.0000 mg | Freq: Once | INTRAMUSCULAR | Status: AC
  Administered 2017-08-18: 1 mg via INTRAVENOUS
  Filled 2017-08-18: qty 1

## 2017-08-18 MED ORDER — IOHEXOL 300 MG/ML  SOLN
100.0000 mL | Freq: Once | INTRAMUSCULAR | Status: AC | PRN
  Administered 2017-08-18: 100 mL via INTRAVENOUS

## 2017-08-18 MED ORDER — ONDANSETRON HCL 4 MG/2ML IJ SOLN
4.0000 mg | Freq: Once | INTRAMUSCULAR | Status: AC
  Administered 2017-08-18: 4 mg via INTRAVENOUS
  Filled 2017-08-18: qty 2

## 2017-08-18 MED ORDER — KETOROLAC TROMETHAMINE 15 MG/ML IJ SOLN
15.0000 mg | Freq: Once | INTRAMUSCULAR | Status: AC
  Administered 2017-08-18: 15 mg via INTRAVENOUS
  Filled 2017-08-18: qty 1

## 2017-08-18 NOTE — ED Provider Notes (Signed)
[facility] EMERGENCY DEPARTMENT Provider Note   CSN: [id number] Arrival date & time: 08/18/17  1637     History   Chief Complaint Chief Complaint  Patient presents with  . Abdominal Pain    HPI Craig Lopez is a 57 y.o. male with a hx of appendectomy who presents to the ED with complaint of abodminal pain that started this morning at 11:00. Patient states pain is located in the LLQ, has been constant, rates his current pain a 9/10 in severity. States it is sharp. It is worse with movement and is without alleviating factors. States pain quality/severity feels similar to when he had appendicitis just this is in a different location. He has had associated nausea with non bloody emesis. Denies fever, chills, diarrhea, constipation, blood in stool, dysuria, hematuria, or testicular pain/swelling.   HPI  Past Medical History:  Diagnosis Date  . Back pain   . Environmental allergies   . Knee problem     There are no active problems to display for this patient.   Past Surgical History:  Procedure Laterality Date  . APPENDECTOMY    . KNEE SURGERY Left    Multiple Knee surgeries   . WISDOM TOOTH EXTRACTION          Home Medications    Prior to Admission medications   Medication Sig Start Date End Date Taking? Authorizing Provider  Bepotastine Besilate (BEPREVE) 1.5 % SOLN Can use one drop in each eye twice daily if needed for itchy eyes. 08/03/15   Kozlow, Alvira Philips, MD  CARBOXYMETHYLCELLULOSE SODIUM OP Apply to eye as needed.    [provider]  cyclobenzaprine (FLEXERIL) 10 MG tablet Take 10 mg by mouth 3 (three) times daily as needed for muscle spasms.    [provider]  EPINEPHrine 0.3 mg/0.3 mL IJ SOAJ injection Use as directed for life-threatening allergic reaction. 08/03/15   Kozlow, Alvira Philips, MD  fluticasone (FLONASE) 50 MCG/ACT nasal spray Place 1 spray into both nostrils daily.    [provider]  ibuprofen (ADVIL,MOTRIN) 800  MG tablet Take 800 mg by mouth every 8 (eight) hours as needed for pain.    [provider]  loratadine (CLARITIN) 10 MG tablet Take 10 mg by mouth daily.    [provider]  metaxalone (SKELAXIN) 800 MG tablet Take 1 tablet (800 mg total) by mouth 3 (three) times daily. 02/02/17   Palumbo, April, MD  montelukast (SINGULAIR) 10 MG tablet Take 10 mg by mouth at bedtime.    [provider]  naproxen (NAPROSYN) 375 MG tablet Take 1 tablet (375 mg total) by mouth 2 (two) times daily. 02/02/17   Palumbo, April, MD  OXYCODONE HCL PO Take by mouth.    [provider]  venlafaxine (EFFEXOR) 100 MG tablet Take 100 mg by mouth 2 (two) times daily.    [provider]    Family History Family History  Problem Relation Age of Onset  . Congestive Heart Failure Mother   . Lung cancer Father        He was a smoker.  . Congestive Heart Failure Sister   . Colon cancer Brother   . Congestive Heart Failure Sister     Social History Social History   Tobacco Use  . Smoking status: Never Smoker  . Smokeless tobacco: Never Used  Substance Use Topics  . Alcohol use: Yes    Comment: socially  . Drug use: No     Allergies  Patient has no known allergies.   Review of Systems Review of Systems  Constitutional: Negative for chills and fever.  Respiratory: Negative for shortness of breath.   Cardiovascular: Negative for chest pain.  Gastrointestinal: Positive for abdominal pain, nausea and vomiting. Negative for blood in stool, constipation and diarrhea.  Genitourinary: Negative for dysuria, flank pain, scrotal swelling and testicular pain.  All other systems reviewed and are negative.    Physical Exam Updated Vital Signs BP (!) 151/87 (BP Location: Right Arm)   Pulse 63   Temp 98.1 F (36.7 C) (Oral)   Resp 20   Ht  (1.854 m)   Wt 134.7 kg (297 lb)   SpO2 100%   BMI 39.18 kg/m   Physical Exam  Constitutional: He appears well-developed  and well-nourished. He appears distressed (mild secondary to discomfort).  HENT:  Head: Normocephalic and atraumatic.  Eyes: Conjunctivae are normal. Right eye exhibits no discharge. Left eye exhibits no discharge.  Cardiovascular: Normal rate and regular rhythm.  No murmur heard. Pulmonary/Chest: Breath sounds normal. No respiratory distress. He has no wheezes. He has no rales.  Abdominal: Soft. He exhibits no distension. There is tenderness in the suprapubic area and left lower quadrant. There is no rigidity, no rebound, no guarding and no CVA tenderness.  Neurological: He is alert.  Clear speech.   Skin: Skin is warm and dry. No rash noted.  Psychiatric: He has a normal mood and affect. His behavior is normal.  Nursing note and vitals reviewed.   ED Treatments / Results  Labs Results for orders placed or performed during the hospital encounter of 08/18/17  Lipase, blood  Result Value Ref Range   Lipase 41 11 - 51 U/L  Comprehensive metabolic panel  Result Value Ref Range   Sodium 137 135 - 145 mmol/L   Potassium 4.0 3.5 - 5.1 mmol/L   Chloride 103 101 - 111 mmol/L   CO2 28 22 - 32 mmol/L   Glucose, Bld 149 (H) 65 - 99 mg/dL   BUN 14 6 - 20 mg/dL   Creatinine, Ser 1.61 (H) 0.61 - 1.24 mg/dL   Calcium 9.0 8.9 - 09.6 mg/dL   Total Protein 7.8 6.5 - 8.1 g/dL   Albumin 4.1 3.5 - 5.0 g/dL   AST 27 15 - 41 U/L   ALT 27 17 - 63 U/L   Alkaline Phosphatase 62 38 - 126 U/L   Total Bilirubin 0.9 0.3 - 1.2 mg/dL   GFR calc non Af Amer 50 (L) >60 mL/min   GFR calc Af Amer 58 (L) >60 mL/min   Anion gap 6 5 - 15  CBC  Result Value Ref Range   WBC 5.8 4.0 - 10.5 K/uL   RBC 4.97 4.22 - 5.81 MIL/uL   Hemoglobin 12.5 (L) 13.0 - 17.0 g/dL   HCT 04.5 (L) 40.9 - 81.1 %   MCV 78.1 78.0 - 100.0 fL   MCH 25.2 (L) 26.0 - 34.0 pg   MCHC 32.2 30.0 - 36.0 g/dL   RDW 91.4 78.2 - 95.6 %   Platelets 203 150 - 400 K/uL  Urinalysis, Routine w reflex microscopic  Result Value Ref Range   Color,  Urine YELLOW YELLOW   APPearance CLEAR CLEAR   Specific Gravity, Urine 1.020 1.005 - 1.030   pH 6.0 5.0 - 8.0   Glucose, UA NEGATIVE NEGATIVE mg/dL   Hgb urine dipstick LARGE (A) NEGATIVE   Bilirubin Urine NEGATIVE NEGATIVE   Ketones, ur NEGATIVE  NEGATIVE mg/dL   Protein, ur 30 (A) NEGATIVE mg/dL   Nitrite NEGATIVE NEGATIVE   Leukocytes, UA NEGATIVE NEGATIVE   RBC / HPF >50 (H) 0 - 5 RBC/hpf   WBC, UA 0-5 0 - 5 WBC/hpf   Bacteria, UA NONE SEEN NONE SEEN   Squamous Epithelial / LPF 0-5 0 - 5   Mucus PRESENT    No results found. EKG None  Radiology Ct Abdomen Pelvis W Contrast  Result Date: 08/18/2017 CLINICAL DATA:  Acute lower abdominal pain. EXAM: CT ABDOMEN AND PELVIS WITH CONTRAST TECHNIQUE: Multidetector CT imaging of the abdomen and pelvis was performed using the standard protocol following bolus administration of intravenous contrast. CONTRAST:  OMNIPAQUE IOHEXOL 300 MG/ML  SOLN COMPARISON:  None. FINDINGS: Lower chest: No acute abnormality. Hepatobiliary: No focal liver abnormality is seen. No gallstones, gallbladder wall thickening, or biliary dilatation. Pancreas: Unremarkable. No pancreatic ductal dilatation or surrounding inflammatory changes. Spleen: Normal in size without focal abnormality. Adrenals/Urinary Tract: Adrenal glands appear normal. Bilateral nephrolithiasis is noted. Mild left hydroureteronephrosis with perinephric stranding is noted secondary to 6 mm distal left ureteral calculus. Urinary bladder is unremarkable. Stomach/Bowel: The stomach appears normal. There is no evidence of bowel obstruction or inflammation. Status post appendectomy. Vascular/Lymphatic: No significant vascular findings are present. No enlarged abdominal or pelvic lymph nodes. Reproductive: Prostate is unremarkable. Other: No abdominal wall hernia or abnormality. No abdominopelvic ascites. Musculoskeletal: No acute or significant osseous findings. IMPRESSION: Bilateral nephrolithiasis. Mild  left hydroureteronephrosis with perinephric stranding is noted secondary to 6 mm distal left ureteral calculus. Electronically Signed   By: Lupita Raider, M.D.   On: 08/18/2017 20:39    Procedures Procedures (including critical care time)  Medications Ordered in ED Medications - No data to display   Initial Impression / Assessment and Plan / ED Course  I have reviewed the triage vital signs and the nursing notes.  Pertinent labs & imaging results that were available during my care of the patient were reviewed by me and considered in my medical decision making (see chart for details).   Patient presents with LLQ abdominal pain and associated N/V. He is nontoxic appearing, appears uncomfortable, vitals notable for hypertension- do not suspect HTN emergency at this time, somewhat consistent with prior ER visits, he is aware of need for recheck. Exam with LLQ and left sided suprapubic tenderness to palpation. There are no peritoneal signs. DDX: diverticulitis, nephrolithiasis, bowel obstruction/perforation, pancreatitis.   Triage ordered labs have been reviewed: no leukocytosis, hgb 12.5- no old labs to compare to. No significant electrolyte abnormalities. Lipase WNL. LFTs WNL. Patient's creatinine is 1.53- he reports no hx of CKD, difficult without comparison labs, however this will need repeat by PCP. UA with notable hematuria and proteinuria, no gross infection noted.  Labs somewhat suspicion for nephrolithiasis, however patient's reported hx and physical somewhat more concerning for diverticulitis. Will evaluate with CT abdomen/pelvis with contrast to further evaluate. Administer fluids, morphine, and zofran.   20:30: RE-EVAL: Patient had mild improvement with morphine/zofran, however still in pain and nauseous- zofran and dilaudid ordered.   CT abdomen/pelvis with bilateral nephrolithiasis. Mild left hydroureteronephrosis with perinephric stranding is noted secondary to 6 mm distal left  ureteral calculus. Will give Toradol.   22:25: RE-EVAL: Patient is resting comfortably, tolerating PO. Feels comfortable with DC home.   Discussed with supervising physician Dr. Effie Shy, in agreement for plan with discharge home with pain control, anti-emetic, flomax, and urology follow-up. I discussed results, treatment plan, need  for urology follow-up, and return precautions with the patient and his wife. Provided opportunity for questions, patient and his wife confirmed understanding and are in agreement with plan.    Final Clinical Impressions(s) / ED Diagnoses   Final diagnoses:  Nephrolithiasis    ED Discharge Orders        Ordered    ondansetron (ZOFRAN ODT) 4 MG disintegrating tablet  Every 8 hours PRN     08/18/17 2220    oxyCODONE-acetaminophen (PERCOCET/ROXICET) 5-325 MG tablet  Every 4 hours PRN     08/18/17 2220    tamsulosin (FLOMAX) 0.4 MG CAPS capsule  Daily after breakfast     08/18/17 2220       Kabria Hetzer, Pleas Koch, PA-C 08/18/17 2308    Mancel Bale, MD 08/19/17 2154

## 2017-08-18 NOTE — Discharge Instructions (Addendum)
You were seen in the emergency department today and diagnosed with a kidney stone.  The kidney stone is 6 mm in size.  Additionally your kidney function was somewhat elevated with a creatinine of 1.53-it is important that you have this rechecked by urology (the kidney and bladder doctor per).  We are treating you for symptoms related to your kidney stone: -Percocet-is a narcotic pain medication, take this every 4 hours as needed for pain.  Keep this out of the reach of small children, do not drink alcohol when taking this medicine.  You may take ibuprofen with this safely.  Be sure to check the amount of Tylenol you take as this medication has Tylenol with it, he should not exceed 3200 mg of tylenol total in 24 hours.  -Zofran-this is an antinausea medication, take this every 8 hours as needed for nausea, let it dissolve under your tongue -Flomax-this medication helps you to pass the stone.  Take this daily with breakfast.  We have prescribed you new medication(s) today. Discuss the medications prescribed today with your pharmacist as they can have adverse effects and interactions with your other medicines including over the counter and prescribed medications. Seek medical evaluation if you start to experience new or abnormal symptoms after taking one of these medicines, seek care immediately if you start to experience difficulty breathing, feeling of your throat closing, facial swelling, or rash as these could be indications of a more serious allergic reaction  Follow-up with the urologist in your discharge instructions in the next 2 to 3 days for reevaluation and for recheck of your blood work.  Return to the ER anytime for new or worsening symptoms including but not limited to pain that is not controlled by your medications, inability to keep fluids down, or any other concerns that you may have.

## 2017-08-18 NOTE — ED Notes (Signed)
Pt discharged from ED; instructions provided; Pt encouraged to return to ED if symptoms worsen and to f/u with PCP; Pt verbalized understanding of all instructions 

## 2017-08-18 NOTE — ED Triage Notes (Signed)
The pt is c/o lower abd pain since 1100am  With nausea

## 2017-08-23 ENCOUNTER — Ambulatory Visit (INDEPENDENT_AMBULATORY_CARE_PROVIDER_SITE_OTHER): Payer: Non-veteran care

## 2017-08-23 DIAGNOSIS — J309 Allergic rhinitis, unspecified: Secondary | ICD-10-CM

## 2017-09-05 ENCOUNTER — Ambulatory Visit (INDEPENDENT_AMBULATORY_CARE_PROVIDER_SITE_OTHER): Payer: Non-veteran care | Admitting: *Deleted

## 2017-09-05 DIAGNOSIS — J309 Allergic rhinitis, unspecified: Secondary | ICD-10-CM

## 2017-09-18 ENCOUNTER — Ambulatory Visit (INDEPENDENT_AMBULATORY_CARE_PROVIDER_SITE_OTHER): Payer: Non-veteran care | Admitting: *Deleted

## 2017-09-18 DIAGNOSIS — J309 Allergic rhinitis, unspecified: Secondary | ICD-10-CM

## 2017-10-17 ENCOUNTER — Ambulatory Visit (INDEPENDENT_AMBULATORY_CARE_PROVIDER_SITE_OTHER): Payer: Non-veteran care

## 2017-10-17 DIAGNOSIS — J309 Allergic rhinitis, unspecified: Secondary | ICD-10-CM | POA: Diagnosis not present

## 2017-11-08 ENCOUNTER — Ambulatory Visit (INDEPENDENT_AMBULATORY_CARE_PROVIDER_SITE_OTHER): Payer: Non-veteran care | Admitting: *Deleted

## 2017-11-08 DIAGNOSIS — J309 Allergic rhinitis, unspecified: Secondary | ICD-10-CM | POA: Diagnosis not present

## 2017-11-25 ENCOUNTER — Other Ambulatory Visit: Payer: Self-pay

## 2017-11-25 ENCOUNTER — Emergency Department (HOSPITAL_COMMUNITY): Payer: Non-veteran care

## 2017-11-25 ENCOUNTER — Emergency Department (HOSPITAL_COMMUNITY)
Admission: EM | Admit: 2017-11-25 | Discharge: 2017-11-25 | Disposition: A | Discharge status: Home / Self Care | Payer: Non-veteran care | Attending: Emergency Medicine | Admitting: Emergency Medicine

## 2017-11-25 ENCOUNTER — Encounter (HOSPITAL_COMMUNITY): Payer: Self-pay | Admitting: Emergency Medicine

## 2017-11-25 DIAGNOSIS — N50811 Right testicular pain: Secondary | ICD-10-CM | POA: Diagnosis present

## 2017-11-25 DIAGNOSIS — Z79899 Other long term (current) drug therapy: Secondary | ICD-10-CM | POA: Diagnosis not present

## 2017-11-25 DIAGNOSIS — G8929 Other chronic pain: Secondary | ICD-10-CM

## 2017-11-25 DIAGNOSIS — M545 Low back pain: Secondary | ICD-10-CM | POA: Diagnosis not present

## 2017-11-25 LAB — URINALYSIS, ROUTINE W REFLEX MICROSCOPIC
BILIRUBIN URINE: NEGATIVE
GLUCOSE, UA: NEGATIVE mg/dL
Hgb urine dipstick: NEGATIVE
KETONES UR: NEGATIVE mg/dL
Leukocytes, UA: NEGATIVE
NITRITE: NEGATIVE
PROTEIN: NEGATIVE mg/dL
Specific Gravity, Urine: 1.023 (ref 1.005–1.030)
pH: 5 (ref 5.0–8.0)

## 2017-11-25 LAB — COMPREHENSIVE METABOLIC PANEL
ALK PHOS: 62 U/L (ref 38–126)
ALT: 26 U/L (ref 0–44)
AST: 25 U/L (ref 15–41)
Albumin: 4.1 g/dL (ref 3.5–5.0)
Anion gap: 10 (ref 5–15)
BILIRUBIN TOTAL: 0.8 mg/dL (ref 0.3–1.2)
BUN: 19 mg/dL (ref 6–20)
CO2: 24 mmol/L (ref 22–32)
CREATININE: 1.29 mg/dL — AB (ref 0.61–1.24)
Calcium: 9.3 mg/dL (ref 8.9–10.3)
Chloride: 103 mmol/L (ref 98–111)
GFR calc Af Amer: >60 mL/min (ref >60)
Glucose, Bld: 218 mg/dL — ABNORMAL HIGH (ref 70–99)
Potassium: 3.6 mmol/L (ref 3.5–5.1)
Sodium: 137 mmol/L (ref 135–145)
TOTAL PROTEIN: 7.8 g/dL (ref 6.5–8.1)

## 2017-11-25 LAB — CBC
HCT: 42.2 % (ref 39.0–52.0)
Hemoglobin: 13.3 g/dL (ref 13.0–17.0)
MCH: 25.1 pg — ABNORMAL LOW (ref 26.0–34.0)
MCHC: 31.5 g/dL (ref 30.0–36.0)
MCV: 79.6 fL (ref 78.0–100.0)
PLATELETS: 206 10*3/uL (ref 150–400)
RBC: 5.3 MIL/uL (ref 4.22–5.81)
RDW: 15 % (ref 11.5–15.5)
WBC: 3.4 10*3/uL — AB (ref 4.0–10.5)

## 2017-11-25 LAB — LIPASE, BLOOD: Lipase: 42 U/L (ref 11–51)

## 2017-11-25 MED ORDER — OXYCODONE-ACETAMINOPHEN 5-325 MG PO TABS
2.0000 | ORAL_TABLET | Freq: Once | ORAL | Status: AC
  Administered 2017-11-25: 2 via ORAL
  Filled 2017-11-25: qty 2

## 2017-11-25 NOTE — ED Notes (Signed)
Pt is at ultrasound

## 2017-11-25 NOTE — ED Provider Notes (Signed)
[facility] EMERGENCY DEPARTMENT Provider Note   CSN: [id number] Arrival date & time: 11/25/17  1254     History   Chief Complaint Chief Complaint  Patient presents with  . Back Pain  . Testicle Pain    HPI Craig Lopez is a 57 y.o. male.  HPI 57 year old male with history of chronic back pain and chronic knee pain as well as recurrent nephrolithiasis presents to the emergency department today for evaluation of right testicle pain and chronic right lower back pain.  States pain began early this morning.  States "it feels like someone kicked me in my testicle."  Describes nausea and aching and throughout his entire lower abdomen.  Denies any dysuria or hematuria.  No penile drainage.  Sexually active with his wife in a monogamous relationship for the past 29 years.  No history of this.  Has not noticed any redness or swelling to the scrotum or testicles.  Worse with palpation.  States does feel similar to prior kidney stones as well. No vomiting or diarrhea. No hematochezia or melena. Had normal BM for self this morning and has continued to pass gas. No fevers or chills.   Also has chronic right lower back pain. States this has been present over past couple years and is largely unchanged today (may be sharper in quality). Always worse with bearing weight on his right leg. No numbness, weakness, or saddle anesthesia. No loss of bowel/bladder function, incontinence, or retention.    Past Medical History:  Diagnosis Date  . Back pain   . Environmental allergies   . Knee problem     There are no active problems to display for this patient.   Past Surgical History:  Procedure Laterality Date  . APPENDECTOMY    . KNEE SURGERY Left    Multiple Knee surgeries   . WISDOM TOOTH EXTRACTION          Home Medications    Prior to Admission medications   Medication Sig Start Date End Date Taking? Authorizing Provider  Bepotastine Besilate (BEPREVE) 1.5 % SOLN Can  use one drop in each eye twice daily if needed for itchy eyes. 08/03/15   Kozlow, Alvira Philips, MD  cyclobenzaprine (FLEXERIL) 10 MG tablet Take 10 mg by mouth 3 (three) times daily as needed for muscle spasms.    [provider]  EPINEPHrine 0.3 mg/0.3 mL IJ SOAJ injection Use as directed for life-threatening allergic reaction. 08/03/15   Kozlow, Alvira Philips, MD  fluticasone (FLONASE) 50 MCG/ACT nasal spray Place 1 spray into both nostrils daily.    [provider]  ibuprofen (ADVIL,MOTRIN) 800 MG tablet Take 800 mg by mouth every 8 (eight) hours as needed for pain.    [provider]  loratadine (CLARITIN) 10 MG tablet Take 10 mg by mouth daily.    [provider]  montelukast (SINGULAIR) 10 MG tablet Take 10 mg by mouth at bedtime as needed (allergies).     [provider]  ondansetron (ZOFRAN ODT) 4 MG disintegrating tablet Take 1 tablet (4 mg total) by mouth every 8 (eight) hours as needed for nausea or vomiting. 08/18/17   Petrucelli, Samantha R, PA-C  OxyCODONE HCl, Abuse Deter, (OXAYDO) 5 MG TABA Take 5 mg by mouth every 6 (six) hours as needed (severe pain).     [provider]  oxyCODONE-acetaminophen (PERCOCET/ROXICET) 5-325 MG tablet Take 1 tablet by mouth every 4 (four) hours as needed for severe pain. 08/18/17   Petrucelli,  Pleas Koch, PA-C  tamsulosin (FLOMAX) 0.4 MG CAPS capsule Take 1 capsule (0.4 mg total) by mouth daily after breakfast. 08/18/17   Petrucelli, Pleas Koch, PA-C    Family History Family History  Problem Relation Age of Onset  . Congestive Heart Failure Mother   . Lung cancer Father        He was a smoker.  . Congestive Heart Failure Sister   . Colon cancer Brother   . Congestive Heart Failure Sister     Social History Social History   Tobacco Use  . Smoking status: Never Smoker  . Smokeless tobacco: Never Used  Substance Use Topics  . Alcohol use: Yes    Comment: socially  . Drug use: No     Allergies   Patient  has no known allergies.   Review of Systems Review of Systems  Constitutional: Negative for chills and fever.  HENT: Negative for congestion.   Respiratory: Negative for cough and shortness of breath.   Cardiovascular: Negative for chest pain and leg swelling.  Gastrointestinal: Positive for nausea. Negative for abdominal pain, diarrhea and vomiting.  Genitourinary: Positive for testicular pain (right). Negative for discharge, dysuria, hematuria, penile pain, penile swelling and scrotal swelling.  Musculoskeletal: Positive for back pain (chronic right lower back pain). Negative for neck pain.  Skin: Negative for rash.  Neurological: Negative for weakness, numbness and headaches.     Physical Exam Updated Vital Signs BP (!) 136/91   Pulse 80   Temp 97.8 F (36.6 C) (Oral)   Resp 14   Ht 6\' 1"  (1.854 m)   Wt 128.8 kg   SpO2 97%   BMI 37.47 kg/m   Physical Exam  Constitutional: No distress.  HENT:  Head: Normocephalic and atraumatic.  Eyes: Conjunctivae are normal. Right eye exhibits no discharge. Left eye exhibits no discharge.  Neck: Normal range of motion. No tracheal deviation present.  Cardiovascular: Regular rhythm, normal heart sounds and intact distal pulses.  Pulmonary/Chest: Effort normal and breath sounds normal. No respiratory distress.  Abdominal: Soft. Bowel sounds are normal. He exhibits no distension. There is no tenderness.  Genitourinary:  Genitourinary Comments: bilat testicles with no appreciable nodules or swelling. No GU rash or scrotal swelling. No dishcarge. No ulcerations. Mild tenderness right testicle. No hernias appreciated on valsalva. Cremasteric intact bilat  Musculoskeletal: He exhibits no edema or deformity.  Reproducible tenderness over right SI joint. No midline back pain.   Neurological: He is alert. He exhibits normal muscle tone.  No saddle anesthesia. Able to hold each leg off bed. ambualtes w/o ataxia. Cane used at baseline.   Skin: No  rash noted. He is not diaphoretic.  Psychiatric: He has a normal mood and affect.     ED Treatments / Results  Labs (all labs ordered are listed, but only abnormal results are displayed) Labs Reviewed  COMPREHENSIVE METABOLIC PANEL - Abnormal; Notable for the following components:      Result Value   Glucose, Bld 218 (*)    Creatinine, Ser 1.29 (*)    All other components within normal limits  CBC - Abnormal; Notable for the following components:   WBC 3.4 (*)    MCH 25.1 (*)    All other components within normal limits  LIPASE, BLOOD  URINALYSIS, ROUTINE W REFLEX MICROSCOPIC    EKG None  Radiology US Scrotum W/doppler  Result Date: 11/25/2017 CLINICAL DATA:  Right testicular pain since this morning. EXAM: SCROTAL ULTRASOUND DOPPLER ULTRASOUND OF THE TESTICLES TECHNIQUE:  Complete ultrasound examination of the testicles, epididymis, and other scrotal structures was performed. Color and spectral Doppler ultrasound were also utilized to evaluate blood flow to the testicles. COMPARISON:  None. FINDINGS: Right testicle Measurements: 5.0 x 3.7 x 2.4 cm. Minimally heterogeneous. Normal internal blood flow with color Doppler. Left testicle Measurements: 2.9 x 2.7 x 1.5 cm. Mildly heterogeneous. Normal internal blood flow with color Doppler. Right epididymis: 3 mm cyst in the head of the epididymis. Otherwise, normal. Left epididymis:  Normal in size and appearance. Hydrocele: Small right hydrocele with low-level internal echoes adjacent to the epididymis. Varicocele:  None visualized. Pulsed Doppler interrogation of both testes demonstrates normal low resistance arterial and venous waveforms bilaterally. IMPRESSION: 1. No evidence of testicular torsion or epididymo-orchitis. 2. Small left testicle and normal sized right testicle. This could be due to previous left testicle trauma or transient torsion. There is no abnormal enlargement of the right testicle today. 3. 3 mm right epididymal cyst. 4.  Small right hydrocele with low-level internal echoes adjacent to the epididymis. This may be due to previous infection. No evidence of acute infection today. Electronically Signed   By: Beckie SaltsSteven  Reid M.D.   On: 11/25/2017 14:49    Procedures Procedures (including critical care time)  Medications Ordered in ED Medications  oxyCODONE-acetaminophen (PERCOCET/ROXICET) 5-325 MG per tablet 2 tablet (2 tablets Oral Given 11/25/17 1613)     Initial Impression / Assessment and Plan / ED Course  I have reviewed the triage vital signs and the nursing notes.  Pertinent labs & imaging results that were available during my care of the patient were reviewed by me and considered in my medical decision making (see chart for details).    57 year old male with history of chronic back pain and chronic knee pain as well as recurrent nephrolithiasis presents to the emergency department today for evaluation of right testicle pain and chronic right lower back pain.   Pt afeb, HDS in ED. Exam as above. STAT testicular/scrotal US obtained from triage with no evidence of torsion or epididymo-orchitis. Low risk for STI. No fevers. History appendectomy and no RLQ pain. History and exam not consistent with hernia. Does have history recurrent nephrolithiasis including multiple right sided kidney stones on CT in May 2019. Higher suspicion for pain due to nephrolithiasis. Bedside US performed of bilat kidneys with no significant hydronephrosis. CBC and  CMP/lipase reviewed from today with no significant abnormalities. Renal function at baseline. No transaminitis. No findings to suggest cholestatic process or pancreatitis. UA obtained that shows no evidence UTI. Doubt prostatitis given no history of same, no fevers, no leukocytosis, and no evidence UTI. Discussed symptomatic management for presumed nephrolithiasis. Has oxycodone for chronic pain at home. Does not require repeat CT at this time given CT proven stones in past, no  UTI, pain well controlled, no fevers, and no significant hydro on ultrasound.   Regarding rihgt lower back pain, not consistent with pyelo. No findings to suggest cauda equina, SEA, or SEH. No falls or traumas to suspect fracture. CT abd/pelv may 2019 with no aortic dissections or aneurysm. Suspect, given location over SI, chronic arthritis. Supportive measures discussed. Advised close PCP f/u.   Case and plan of care discussed with Dr. Particia NearingHaviland.   Final Clinical Impressions(s) / ED Diagnoses   Final diagnoses:  Chronic right-sided low back pain, with sciatica presence unspecified  Right testicular pain    ED Discharge Orders    None       Rigoberto Noelickens, Sherika Kubicki, MD 11/25/17  1736    Jacalyn Lefevre, MD 11/25/17 1836

## 2017-11-25 NOTE — ED Notes (Signed)
Pt returned from US and is resting in bed.

## 2017-11-25 NOTE — Discharge Instructions (Signed)
Follow-up with your primary care doctor in 1 week for reevaluation.  Return the emergency department if symptoms worsen, numbness, weakness, blood in urine, blood in stool, fevers or other concerning symptoms.  Continue home medication as previously prescribed.

## 2017-11-25 NOTE — ED Triage Notes (Addendum)
Pt reports pain/tenderness in his R testicle that began this am, states pain radiates up to his abdomen. Pt also reports lower right sided back pain, worsening with weight bearing on his right leg. Denies any injuries, no discharge, no urinary symptoms.

## 2017-12-06 ENCOUNTER — Ambulatory Visit (INDEPENDENT_AMBULATORY_CARE_PROVIDER_SITE_OTHER): Payer: PRIVATE HEALTH INSURANCE | Admitting: *Deleted

## 2017-12-06 DIAGNOSIS — J309 Allergic rhinitis, unspecified: Secondary | ICD-10-CM | POA: Diagnosis not present

## 2017-12-12 DIAGNOSIS — J3089 Other allergic rhinitis: Secondary | ICD-10-CM

## 2017-12-12 NOTE — Progress Notes (Signed)
VIALS EXP 12-13-18 

## 2017-12-13 DIAGNOSIS — J301 Allergic rhinitis due to pollen: Secondary | ICD-10-CM

## 2018-01-03 ENCOUNTER — Ambulatory Visit (INDEPENDENT_AMBULATORY_CARE_PROVIDER_SITE_OTHER): Payer: PRIVATE HEALTH INSURANCE | Admitting: *Deleted

## 2018-01-03 DIAGNOSIS — J309 Allergic rhinitis, unspecified: Secondary | ICD-10-CM | POA: Diagnosis not present

## 2018-01-24 ENCOUNTER — Ambulatory Visit (INDEPENDENT_AMBULATORY_CARE_PROVIDER_SITE_OTHER): Payer: PRIVATE HEALTH INSURANCE | Admitting: *Deleted

## 2018-01-24 DIAGNOSIS — J309 Allergic rhinitis, unspecified: Secondary | ICD-10-CM | POA: Diagnosis not present

## 2018-02-08 ENCOUNTER — Ambulatory Visit (INDEPENDENT_AMBULATORY_CARE_PROVIDER_SITE_OTHER): Payer: PRIVATE HEALTH INSURANCE

## 2018-02-08 DIAGNOSIS — J309 Allergic rhinitis, unspecified: Secondary | ICD-10-CM

## 2018-03-05 ENCOUNTER — Ambulatory Visit: Payer: No Typology Code available for payment source | Attending: Physician Assistant | Admitting: Occupational Therapy

## 2018-03-05 ENCOUNTER — Ambulatory Visit (INDEPENDENT_AMBULATORY_CARE_PROVIDER_SITE_OTHER): Payer: PRIVATE HEALTH INSURANCE | Admitting: *Deleted

## 2018-03-05 DIAGNOSIS — M6281 Muscle weakness (generalized): Secondary | ICD-10-CM | POA: Insufficient documentation

## 2018-03-05 DIAGNOSIS — J309 Allergic rhinitis, unspecified: Secondary | ICD-10-CM

## 2018-03-05 DIAGNOSIS — M25542 Pain in joints of left hand: Secondary | ICD-10-CM | POA: Diagnosis not present

## 2018-03-05 DIAGNOSIS — M25642 Stiffness of left hand, not elsewhere classified: Secondary | ICD-10-CM | POA: Diagnosis present
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
## 2018-03-11 ENCOUNTER — Ambulatory Visit: Payer: No Typology Code available for payment source | Admitting: Occupational Therapy

## 2018-03-11 ENCOUNTER — Encounter: Payer: Self-pay | Admitting: Occupational Therapy

## 2018-03-11 DIAGNOSIS — M6281 Muscle weakness (generalized): Secondary | ICD-10-CM

## 2018-03-11 DIAGNOSIS — M25542 Pain in joints of left hand: Secondary | ICD-10-CM | POA: Diagnosis not present

## 2018-03-11 DIAGNOSIS — M25642 Stiffness of left hand, not elsewhere classified: Secondary | ICD-10-CM
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
## 2018-03-11 NOTE — Patient Instructions (Signed)
  Your Splint This splint should initially be fitted by a healthcare practitioner.  The healthcare practitioner is responsible for providing wearing instructions and precautions to the patient, other healthcare practitioners and care provider involved in the patient's care.  This splint was custom made for you. Please read the following instructions to learn about wearing and caring for your splint.  Precautions Should your splint cause any of the following problems, remove the splint immediately and contact your therapist/physician.  Swelling  Severe Pain  Pressure Areas  Stiffness  Numbness  Do not wear your splint while operating machinery unless it has been fabricated for that purpose.  When To Wear Your Splint Where your splint according to your therapist/physician instructions. Wear long splint at night. (Put on before bedtime for at least 2 hours to ensure it is not bothering you anywhere for the first day).  Wear small ring splint during the day as much as possible.  Care and Cleaning of Your Splint 1. Keep your splint away from open flames. 2. Your splint will lose its shape in temperatures over 135 degrees Farenheit, ( in car windows, near radiators, ovens or in hot water).  Never make any adjustments to your splint, if the splint needs adjusting remove it and make an appointment to see your therapist. 3. Your splint, including the cushion liner may be cleaned with soap and lukewarm water.  Do not immerse in hot water over 135 degrees Farenheit. 4. Straps may be washed with soap and water, but do not moisten the self-adhesive portion. 5. For ink or hard to remove spots use a scouring cleanser which contains chlorine.  Rinse the splint thoroughly after using chlorine cleanser.

## 2018-03-19 ENCOUNTER — Ambulatory Visit: Payer: No Typology Code available for payment source | Attending: Physician Assistant | Admitting: Occupational Therapy

## 2018-03-19 DIAGNOSIS — M25642 Stiffness of left hand, not elsewhere classified: Secondary | ICD-10-CM | POA: Insufficient documentation

## 2018-03-19 DIAGNOSIS — M25542 Pain in joints of left hand: Secondary | ICD-10-CM | POA: Insufficient documentation

## 2018-03-22 ENCOUNTER — Ambulatory Visit: Payer: No Typology Code available for payment source | Admitting: Occupational Therapy

## 2018-03-25 ENCOUNTER — Ambulatory Visit: Payer: No Typology Code available for payment source | Admitting: Occupational Therapy

## 2018-03-29 ENCOUNTER — Ambulatory Visit: Payer: No Typology Code available for payment source | Admitting: Occupational Therapy

## 2018-04-11 ENCOUNTER — Ambulatory Visit: Payer: No Typology Code available for payment source | Admitting: Occupational Therapy

## 2018-04-12 ENCOUNTER — Ambulatory Visit: Payer: No Typology Code available for payment source | Admitting: Occupational Therapy

## 2018-04-13 ENCOUNTER — Emergency Department (HOSPITAL_COMMUNITY)
Admission: EM | Admit: 2018-04-13 | Discharge: 2018-04-13 | Disposition: A | Discharge status: Home / Self Care | Payer: Non-veteran care | Attending: Emergency Medicine | Admitting: Emergency Medicine

## 2018-04-13 ENCOUNTER — Emergency Department (HOSPITAL_COMMUNITY): Payer: Non-veteran care

## 2018-04-13 ENCOUNTER — Encounter (HOSPITAL_COMMUNITY): Payer: Self-pay | Admitting: Emergency Medicine

## 2018-04-13 DIAGNOSIS — M79675 Pain in left toe(s): Secondary | ICD-10-CM | POA: Diagnosis present

## 2018-04-13 DIAGNOSIS — Z5321 Procedure and treatment not carried out due to patient leaving prior to being seen by health care provider: Secondary | ICD-10-CM | POA: Diagnosis not present
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
## 2018-04-13 NOTE — ED Notes (Signed)
Pt's family stated they can not wait any longer and are leaving

## 2018-04-15 ENCOUNTER — Ambulatory Visit: Payer: No Typology Code available for payment source | Admitting: Occupational Therapy

## 2018-04-15 DIAGNOSIS — M25642 Stiffness of left hand, not elsewhere classified: Secondary | ICD-10-CM | POA: Diagnosis present

## 2018-04-15 DIAGNOSIS — M25542 Pain in joints of left hand: Secondary | ICD-10-CM
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
## 2018-04-15 NOTE — Patient Instructions (Signed)
AROM: PIP Flexion / Extension    Pinch bottom knuckle of ___ring_____ finger of left hand to prevent bending. Actively bend middle knuckle until stretch is felt. Hold _5___ seconds. Relax. Straighten finger as far as possible. Repeat _10___ times per set.  Do __3__ sessions per day.  MP / PIP / DIP Composite Flexion (Passive Stretch)    Use other hand to bend ___all___ fingers at all three joints. Hold _5__ seconds. Repeat __10__ times. Do __3__ sessions per day.

## 2018-04-18 ENCOUNTER — Encounter: Payer: Non-veteran care | Admitting: Occupational Therapy

## 2018-04-22 ENCOUNTER — Ambulatory Visit: Payer: Non-veteran care | Attending: Physician Assistant | Admitting: Occupational Therapy

## 2018-04-29 ENCOUNTER — Ambulatory Visit: Payer: Non-veteran care | Admitting: Occupational Therapy

## 2018-04-29 NOTE — Therapy (Signed)
[place] [address] [place], [place], [address] Phone: [phone]   Fax:  [phone]  Patient Details  Name: Craig Lopez MRN: [id number] Date of Birth: 1960/07/24 Referring Provider:  No ref. provider found  Encounter Date: 04/29/2018  OCCUPATIONAL THERAPY DISCHARGE SUMMARY  Visits from Start of Care: 3  Current functional level related to goals / functional outcomes:  OT Short Term Goals - 04/15/18 0851      OT SHORT TERM GOAL #1   Title  Independent with initial HEP - 04/04/18    Time  4    Period  Weeks    Status  Achieved      OT SHORT TERM GOAL #2   Title  Independent with daytime and pm splints     Time  4    Period  Weeks    Status  Achieved     OT SHORT TERM GOAL #3   Title  Pt to report less triggering and pain Lt ring finger     Time  4    Period  Weeks    Status  Partially met - only with splint on      Pt did not meet any LTG's secondary to not returning to therapy to assess.    Remaining deficits: Same as initial evaluation   Education / Equipment: Splint wear and care, HEP  Plan:                                                    Patient goals were partially met. Patient is being discharged due to not returning since the last visit.  (Pt scheduled for surgery in February for trigger finger)?????         Carey Bullocks, OTR/L 04/29/2018, 10:13 AM  [facility] [address] [place], [place], [address] Phone: [phone]   Fax:  [phone]

## 2018-04-30 ENCOUNTER — Ambulatory Visit (INDEPENDENT_AMBULATORY_CARE_PROVIDER_SITE_OTHER): Payer: PRIVATE HEALTH INSURANCE | Admitting: *Deleted

## 2018-04-30 DIAGNOSIS — J309 Allergic rhinitis, unspecified: Secondary | ICD-10-CM

## 2018-05-29 ENCOUNTER — Ambulatory Visit (INDEPENDENT_AMBULATORY_CARE_PROVIDER_SITE_OTHER): Payer: PRIVATE HEALTH INSURANCE | Admitting: *Deleted

## 2018-05-29 DIAGNOSIS — Z79899 Other long term (current) drug therapy: Secondary | ICD-10-CM | POA: Diagnosis not present

## 2018-05-29 DIAGNOSIS — J309 Allergic rhinitis, unspecified: Secondary | ICD-10-CM

## 2018-05-29 DIAGNOSIS — R1032 Left lower quadrant pain: Secondary | ICD-10-CM | POA: Diagnosis not present

## 2018-05-29 DIAGNOSIS — Z4889 Encounter for other specified surgical aftercare: Secondary | ICD-10-CM | POA: Diagnosis not present

## 2018-05-30 ENCOUNTER — Other Ambulatory Visit: Payer: Self-pay

## 2018-05-30 ENCOUNTER — Emergency Department (HOSPITAL_COMMUNITY): Payer: Non-veteran care

## 2018-05-30 ENCOUNTER — Encounter (HOSPITAL_COMMUNITY): Payer: Self-pay | Admitting: Emergency Medicine

## 2018-05-30 ENCOUNTER — Emergency Department (HOSPITAL_COMMUNITY)
Admission: EM | Admit: 2018-05-30 | Discharge: 2018-05-30 | Disposition: A | Discharge status: Home / Self Care | Payer: Non-veteran care | Attending: Emergency Medicine | Admitting: Emergency Medicine

## 2018-05-30 DIAGNOSIS — R1032 Left lower quadrant pain: Secondary | ICD-10-CM

## 2018-05-30 DIAGNOSIS — Z4889 Encounter for other specified surgical aftercare: Secondary | ICD-10-CM

## 2018-05-30 LAB — COMPREHENSIVE METABOLIC PANEL
ALT: 51 U/L — ABNORMAL HIGH (ref 0–44)
AST: 44 U/L — AB (ref 15–41)
Albumin: 4.7 g/dL (ref 3.5–5.0)
Alkaline Phosphatase: 77 U/L (ref 38–126)
Anion gap: 8 (ref 5–15)
BUN: 18 mg/dL (ref 6–20)
CHLORIDE: 99 mmol/L (ref 98–111)
CO2: 28 mmol/L (ref 22–32)
Calcium: 9.5 mg/dL (ref 8.9–10.3)
Creatinine, Ser: 1.43 mg/dL — ABNORMAL HIGH (ref 0.61–1.24)
GFR calc Af Amer: >60 mL/min (ref >60)
GFR, EST NON AFRICAN AMERICAN: 55 mL/min — AB (ref >60)
Glucose, Bld: 146 mg/dL — ABNORMAL HIGH (ref 70–99)
POTASSIUM: 4 mmol/L (ref 3.5–5.1)
SODIUM: 135 mmol/L (ref 135–145)
Total Bilirubin: 0.9 mg/dL (ref 0.3–1.2)
Total Protein: 8.7 g/dL — ABNORMAL HIGH (ref 6.5–8.1)

## 2018-05-30 LAB — CBC
HEMATOCRIT: 42.1 % (ref 39.0–52.0)
HEMOGLOBIN: 12.9 g/dL — AB (ref 13.0–17.0)
MCH: 24.6 pg — ABNORMAL LOW (ref 26.0–34.0)
MCHC: 30.6 g/dL (ref 30.0–36.0)
MCV: 80.3 fL (ref 80.0–100.0)
NRBC: 0 % (ref 0.0–0.2)
Platelets: 200 10*3/uL (ref 150–400)
RBC: 5.24 MIL/uL (ref 4.22–5.81)
RDW: 14 % (ref 11.5–15.5)
WBC: 5.3 10*3/uL (ref 4.0–10.5)

## 2018-05-30 LAB — URINALYSIS, ROUTINE W REFLEX MICROSCOPIC
BILIRUBIN URINE: NEGATIVE
Glucose, UA: NEGATIVE mg/dL
Hgb urine dipstick: NEGATIVE
Ketones, ur: NEGATIVE mg/dL
LEUKOCYTE UA: NEGATIVE
NITRITE: NEGATIVE
PH: 6 (ref 5.0–8.0)
Protein, ur: 30 mg/dL — AB
SPECIFIC GRAVITY, URINE: 1.023 (ref 1.005–1.030)

## 2018-05-30 LAB — LIPASE, BLOOD: LIPASE: 40 U/L (ref 11–51)

## 2018-05-30 MED ORDER — SODIUM CHLORIDE 0.9% FLUSH: 3.0000 mL | Freq: Once | INTRAVENOUS | Status: DC

## 2018-05-30 MED ORDER — ONDANSETRON 4 MG PO TBDP
4.0000 mg | ORAL_TABLET | Freq: Once | ORAL | Status: AC | PRN
  Administered 2018-05-30: 4 mg via ORAL
  Filled 2018-05-30: qty 1

## 2018-05-30 MED ORDER — FENTANYL CITRATE (PF) 100 MCG/2ML IJ SOLN
50.0000 ug | Freq: Once | INTRAMUSCULAR | Status: AC
  Administered 2018-05-30: 50 ug via INTRAVENOUS
  Filled 2018-05-30: qty 2

## 2018-05-30 MED ORDER — FENTANYL CITRATE (PF) 100 MCG/2ML IJ SOLN
100.0000 ug | Freq: Once | INTRAMUSCULAR | Status: AC
  Administered 2018-05-30: 100 ug via INTRAVENOUS
  Filled 2018-05-30: qty 2

## 2018-05-30 MED ORDER — SODIUM CHLORIDE (PF) 0.9 % IJ SOLN
INTRAMUSCULAR | Status: AC
  Filled 2018-05-30: qty 50

## 2018-05-30 MED ORDER — IOPAMIDOL (ISOVUE-300) INJECTION 61%
INTRAVENOUS | Status: AC
  Filled 2018-05-30: qty 100

## 2018-05-30 MED ORDER — ONDANSETRON HCL 4 MG/2ML IJ SOLN
4.0000 mg | Freq: Once | INTRAMUSCULAR | Status: AC
  Administered 2018-05-30: 4 mg via INTRAVENOUS
  Filled 2018-05-30: qty 2

## 2018-05-30 MED ORDER — ONDANSETRON 8 MG PO TBDP: 8.0000 mg | ORAL_TABLET | Freq: Three times a day (TID) | ORAL | 0 refills | Status: DC | PRN

## 2018-05-30 MED ORDER — IOPAMIDOL (ISOVUE-300) INJECTION 61%
100.0000 mL | Freq: Once | INTRAVENOUS | Status: AC | PRN
Start: 2018-05-30 — End: 2018-05-30
  Administered 2018-05-30: 100 mL via INTRAVENOUS

## 2018-05-30 NOTE — ED Notes (Signed)
Cleansed left hand with normal saline. Patted dry and placed 4x4s and kerlix and tape.

## 2018-05-30 NOTE — ED Triage Notes (Addendum)
Patient complaining of left lower abdominal pain that started three hours ago. Patient states it is a stabbing pain. Patient states he has vomited in the last three hours.

## 2018-05-30 NOTE — ED Provider Notes (Signed)
[facility] DEPT Provider Note: Craig Lopez [name], MD, FACEP  CSN: [id number] MRN: [id number] ARRIVAL: 05/29/18 at 2353 ROOM: WA25/WA25   CHIEF COMPLAINT  Abdominal Pain   HISTORY OF PRESENT ILLNESS  05/30/18 2:23 AM Craig Lopez is a 58 y.o. male who recently had surgery for 2 trigger fingers of his left hand in [place].  He is here with several hours of left lower quadrant pain.  The pain has been constant.  It is described as sharp and he rates it as a 9 out of 10.  It is worse with movement or palpation.  He has vomited with this twice.  He has felt hot and cold but is not sure if he has had a fever.  He denies diarrhea, dysuria or hematuria.   Past Medical History:  Diagnosis Date  . Back pain   . Environmental allergies   . Knee problem     Past Surgical History:  Procedure Laterality Date  . APPENDECTOMY    . KNEE SURGERY Left    Multiple Knee surgeries   . WISDOM TOOTH EXTRACTION      Family History  Problem Relation Age of Onset  . Congestive Heart Failure Mother   . Lung cancer Father        He was a smoker.  . Congestive Heart Failure Sister   . Colon cancer Brother   . Congestive Heart Failure Sister     Social History   Tobacco Use  . Smoking status: Never Smoker  . Smokeless tobacco: Never Used  Substance Use Topics  . Alcohol use: Yes    Comment: socially  . Drug use: No    Prior to Admission medications   Medication Sig Start Date End Date Taking? Authorizing Provider  Bepotastine Besilate (BEPREVE) 1.5 % SOLN Can use one drop in each eye twice daily if needed for itchy eyes. 08/03/15   Kozlow, Alvira PhilipsEric J, MD  cyclobenzaprine (FLEXERIL) 10 MG tablet Take 10 mg by mouth 3 (three) times daily as needed for muscle spasms.    [provider]  EPINEPHrine 0.3 mg/0.3 mL IJ SOAJ injection Use as directed for life-threatening allergic reaction. 08/03/15   Kozlow, Alvira PhilipsEric J, MD  fluticasone (FLONASE) 50 MCG/ACT nasal spray Place 1 spray into both  nostrils daily.    [provider]  ibuprofen (ADVIL,MOTRIN) 800 MG tablet Take 800 mg by mouth every 8 (eight) hours as needed for pain.    [provider]  loratadine (CLARITIN) 10 MG tablet Take 10 mg by mouth daily.    [provider]  montelukast (SINGULAIR) 10 MG tablet Take 10 mg by mouth at bedtime as needed (allergies).     [provider]  ondansetron (ZOFRAN ODT) 4 MG disintegrating tablet Take 1 tablet (4 mg total) by mouth every 8 (eight) hours as needed for nausea or vomiting. 08/18/17   Petrucelli, Samantha R, PA-C  OxyCODONE HCl, Abuse Deter, (OXAYDO) 5 MG TABA Take 5 mg by mouth every 6 (six) hours as needed (severe pain).     [provider]  oxyCODONE-acetaminophen (PERCOCET/ROXICET) 5-325 MG tablet Take 1 tablet by mouth every 4 (four) hours as needed for severe pain. 08/18/17   Petrucelli, Samantha R, PA-C  QUEtiapine (SEROQUEL XR) 50 MG TB24 24 hr tablet Take 50 mg by mouth.    [provider]  tamsulosin (FLOMAX) 0.4 MG CAPS capsule Take 1 capsule (0.4 mg total) by mouth daily after breakfast. 08/18/17   Petrucelli, Pleas KochSamantha R, PA-C  Allergies Patient has no known allergies.   REVIEW OF SYSTEMS  Negative except as noted here or in the History of Present Illness.   PHYSICAL EXAMINATION  Initial Vital Signs Blood pressure 91/71, pulse (!) 57, temperature 97.6 F (36.4 C), temperature source Oral, resp. rate 18, height 6\' 1"  (1.854 m), weight 125.2 kg, SpO2 98 %.  Examination General: Well-developed, well-nourished male in no acute distress; appearance consistent with age of record HENT: normocephalic; atraumatic Eyes: pupils equal, round and reactive to light; extraocular muscles intact Neck: supple Heart: regular rate and rhythm; no murmurs, rubs or gallops Lungs: clear to auscultation bilaterally Abdomen: soft; nondistended; left lower quadrant tenderness; bowel sounds present Extremities: No deformity; full  range of motion except left hand due to recent surgery; well-healing surgical incisions to palm of left hand without signs of infection; pulses normal Neurologic: Awake, alert and oriented; motor function intact in all extremities and symmetric; no facial droop Skin: Warm and dry Psychiatric: Normal mood and affect   RESULTS  Summary of this visit's results, reviewed by myself:   EKG Interpretation  Date/Time:    Ventricular Rate:    PR Interval:    QRS Duration:   QT Interval:    QTC Calculation:   R Axis:     Text Interpretation:        Laboratory Studies: Results for orders placed or performed during the hospital encounter of 05/30/18 (from the past 24 hour(s))  Urinalysis, Routine w reflex microscopic     Status: Abnormal   Collection Time: 05/30/18  1:07 AM  Result Value Ref Range   Color, Urine YELLOW YELLOW   APPearance HAZY (A) CLEAR   Specific Gravity, Urine 1.023 1.005 - 1.030   pH 6.0 5.0 - 8.0   Glucose, UA NEGATIVE NEGATIVE mg/dL   Hgb urine dipstick NEGATIVE NEGATIVE   Bilirubin Urine NEGATIVE NEGATIVE   Ketones, ur NEGATIVE NEGATIVE mg/dL   Protein, ur 30 (A) NEGATIVE mg/dL   Nitrite NEGATIVE NEGATIVE   Leukocytes,Ua NEGATIVE NEGATIVE   RBC / HPF 0-5 0 - 5 RBC/hpf   WBC, UA 0-5 0 - 5 WBC/hpf   Bacteria, UA RARE (A) NONE SEEN   Squamous Epithelial / LPF 0-5 0 - 5   Mucus PRESENT    Hyaline Casts, UA PRESENT   Lipase, blood     Status: None   Collection Time: 05/30/18  1:19 AM  Result Value Ref Range   Lipase 40 11 - 51 U/L  Comprehensive metabolic panel     Status: Abnormal   Collection Time: 05/30/18  1:19 AM  Result Value Ref Range   Sodium 135 135 - 145 mmol/L   Potassium 4.0 3.5 - 5.1 mmol/L   Chloride 99 98 - 111 mmol/L   CO2 28 22 - 32 mmol/L   Glucose, Bld 146 (H) 70 - 99 mg/dL   BUN 18 6 - 20 mg/dL   Creatinine, Ser 1.191.43 (H) 0.61 - 1.24 mg/dL   Calcium 9.5 8.9 - 14.710.3 mg/dL   Total Protein 8.7 (H) 6.5 - 8.1 g/dL   Albumin 4.7 3.5 - 5.0  g/dL   AST 44 (H) 15 - 41 U/L   ALT 51 (H) 0 - 44 U/L   Alkaline Phosphatase 77 38 - 126 U/L   Total Bilirubin 0.9 0.3 - 1.2 mg/dL   GFR calc non Af Amer 55 (L) >60 mL/min   GFR calc Af Amer >60 >60 mL/min   Anion gap 8 5 - 15  CBC     Status: Abnormal   Collection Time: 05/30/18  1:19 AM  Result Value Ref Range   WBC 5.3 4.0 - 10.5 K/uL   RBC 5.24 4.22 - 5.81 MIL/uL   Hemoglobin 12.9 (L) 13.0 - 17.0 g/dL   HCT 93.8 10.1 - 75.1 %   MCV 80.3 80.0 - 100.0 fL   MCH 24.6 (L) 26.0 - 34.0 pg   MCHC 30.6 30.0 - 36.0 g/dL   RDW 02.5 85.2 - 77.8 %   Platelets 200 150 - 400 K/uL   nRBC 0.0 0.0 - 0.2 %   Imaging Studies: Ct Abdomen Pelvis W Contrast  Result Date: 05/30/2018 CLINICAL DATA:  Left lower quadrant pain for several hours, initial encounter EXAM: CT ABDOMEN AND PELVIS WITH CONTRAST TECHNIQUE: Multidetector CT imaging of the abdomen and pelvis was performed using the standard protocol following bolus administration of intravenous contrast. CONTRAST:  ISOVUE-300 COMPARISON:  08/18/2017 FINDINGS: Lower chest: No acute abnormality. Hepatobiliary: No focal liver abnormality is seen. No gallstones, gallbladder wall thickening, or biliary dilatation. Pancreas: Unremarkable. No pancreatic ductal dilatation or surrounding inflammatory changes. Spleen: Normal in size without focal abnormality. Adrenals/Urinary Tract: Adrenal glands are within normal limits. The kidneys demonstrate bilateral nonobstructing renal calculi. The ureters are within normal limits. No obstructive changes are seen. The bladder is partially distended. Stomach/Bowel: The appendix is not visualized consistent with the prior surgical history. No diverticulitis is seen. No obstructive or inflammatory changes of the large and small bowel are seen. Vascular/Lymphatic: No significant vascular findings are present. No enlarged abdominal or pelvic lymph nodes. Reproductive: Prostate is unremarkable. Calcifications of the seminal  vesicles are noted bilaterally. Other: No abdominal wall hernia or abnormality. No abdominopelvic ascites. Musculoskeletal: No acute or significant osseous findings. IMPRESSION: Bilateral nonobstructing renal calculi. No acute abnormality to correspond with the patient's given clinical history is seen. Electronically Signed   By: Alcide Clever M.D.   On: 05/30/2018 03:31    ED COURSE and MDM  Nursing notes and initial vitals signs, including pulse oximetry, reviewed.  Vitals:   05/30/18 0009 05/30/18 0030 05/30/18 0254 05/30/18 0300  BP:  126/90 (!) 132/94 (!) 122/91  Pulse:  65 71 73  Resp:  (!) 27 16 20   Temp:   (!) 97.4 F (36.3 C)   TempSrc:   Oral   SpO2:  96% 95% 95%  Weight: 125.2 kg     Height: 6\' 1"  (1.854 m)      3:43 AM Patient advised of reassuring CT scan and laboratory studies.  The cause of his pain is unclear but could represent an acute GI virus.  He states he has adequate oxycodone at home; we will prescribe Zofran for his nausea.  PROCEDURES    ED DIAGNOSES     ICD-10-CM   1. Left lower quadrant abdominal pain R10.32   2. Encounter for post surgical wound check Z48.89        Paula Libra, MD 05/30/18 [phone]

## 2018-05-30 NOTE — ED Notes (Signed)
Patient is c/o 9/10 sharp mid lower abdominal pain.

## 2018-05-30 NOTE — ED Notes (Signed)
Patient requesting MD or RN to change left hand dressing from recent surgery on trigger fingers.

## 2018-06-14 ENCOUNTER — Ambulatory Visit (INDEPENDENT_AMBULATORY_CARE_PROVIDER_SITE_OTHER): Payer: PRIVATE HEALTH INSURANCE | Admitting: *Deleted

## 2018-06-14 DIAGNOSIS — J309 Allergic rhinitis, unspecified: Secondary | ICD-10-CM

## 2018-06-18 ENCOUNTER — Ambulatory Visit (INDEPENDENT_AMBULATORY_CARE_PROVIDER_SITE_OTHER): Payer: PRIVATE HEALTH INSURANCE | Admitting: *Deleted

## 2018-06-18 DIAGNOSIS — J309 Allergic rhinitis, unspecified: Secondary | ICD-10-CM | POA: Diagnosis not present

## 2018-06-25 ENCOUNTER — Ambulatory Visit (INDEPENDENT_AMBULATORY_CARE_PROVIDER_SITE_OTHER): Payer: PRIVATE HEALTH INSURANCE | Admitting: *Deleted

## 2018-06-25 DIAGNOSIS — J309 Allergic rhinitis, unspecified: Secondary | ICD-10-CM

## 2018-07-01 ENCOUNTER — Other Ambulatory Visit: Payer: Self-pay

## 2018-07-01 ENCOUNTER — Ambulatory Visit (INDEPENDENT_AMBULATORY_CARE_PROVIDER_SITE_OTHER): Payer: PRIVATE HEALTH INSURANCE

## 2018-07-01 DIAGNOSIS — J309 Allergic rhinitis, unspecified: Secondary | ICD-10-CM

## 2018-07-08 ENCOUNTER — Ambulatory Visit (INDEPENDENT_AMBULATORY_CARE_PROVIDER_SITE_OTHER): Payer: PRIVATE HEALTH INSURANCE | Admitting: *Deleted

## 2018-07-08 DIAGNOSIS — J309 Allergic rhinitis, unspecified: Secondary | ICD-10-CM | POA: Diagnosis not present

## 2018-07-15 ENCOUNTER — Ambulatory Visit (INDEPENDENT_AMBULATORY_CARE_PROVIDER_SITE_OTHER): Payer: PRIVATE HEALTH INSURANCE

## 2018-07-15 DIAGNOSIS — J309 Allergic rhinitis, unspecified: Secondary | ICD-10-CM

## 2018-07-23 ENCOUNTER — Telehealth (INDEPENDENT_AMBULATORY_CARE_PROVIDER_SITE_OTHER): Payer: Self-pay

## 2018-07-23 NOTE — Telephone Encounter (Signed)
Called patient and asked the screening questions.  Do you have now or have you had in the past 7 days a fever and/or chills? NO  Do you have now or have you had in the past 7 days a cough? NO  Do you have now or have you had in the last 7 days nausea, vomiting or abdominal pain? NO  Have you been exposed to anyone who has tested positive for COVID-19? NO  Have you or anyone who lives with you traveled within the last month? NO 

## 2018-07-24 ENCOUNTER — Other Ambulatory Visit: Payer: Self-pay

## 2018-07-24 ENCOUNTER — Ambulatory Visit (INDEPENDENT_AMBULATORY_CARE_PROVIDER_SITE_OTHER): Payer: PRIVATE HEALTH INSURANCE | Admitting: Orthopaedic Surgery

## 2018-07-24 ENCOUNTER — Encounter (INDEPENDENT_AMBULATORY_CARE_PROVIDER_SITE_OTHER): Payer: Self-pay | Admitting: Orthopaedic Surgery

## 2018-07-24 ENCOUNTER — Ambulatory Visit (INDEPENDENT_AMBULATORY_CARE_PROVIDER_SITE_OTHER): Payer: PRIVATE HEALTH INSURANCE

## 2018-07-24 DIAGNOSIS — G8929 Other chronic pain: Secondary | ICD-10-CM

## 2018-07-24 DIAGNOSIS — M25561 Pain in right knee: Secondary | ICD-10-CM | POA: Diagnosis not present

## 2018-07-24 DIAGNOSIS — S83241A Other tear of medial meniscus, current injury, right knee, initial encounter: Secondary | ICD-10-CM | POA: Insufficient documentation

## 2018-07-24 MED ORDER — LIDOCAINE HCL 1 % IJ SOLN
2.0000 mL | INTRAMUSCULAR | Status: AC | PRN
  Administered 2018-07-24: 2 mL

## 2018-07-24 MED ORDER — BUPIVACAINE HCL 0.25 % IJ SOLN
2.0000 mL | INTRAMUSCULAR | Status: AC | PRN
  Administered 2018-07-24: 2 mL via INTRA_ARTICULAR

## 2018-07-24 MED ORDER — METHYLPREDNISOLONE ACETATE 40 MG/ML IJ SUSP
40.0000 mg | INTRAMUSCULAR | Status: AC | PRN
  Administered 2018-07-24: 40 mg via INTRA_ARTICULAR

## 2018-07-24 NOTE — Progress Notes (Signed)
Office Visit Note   Patient: Craig Lopez           Date of Birth: [birth year]           MRN: [id number] Visit Date: 07/24/2018              Requested by: Charolett Bumpers, PA-C [address] [place], [place] [address] PCP: Charolett Bumpers, PA-C   Assessment & Plan: Visit Diagnoses:  1. 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  We will inject this with cortisone today.  He will take it easy over the next few days.  He will follow-up with [place] as needed.  Follow-Up Instructions: Return if symptoms worsen or fail to improve.   Orders:  Orders Placed This Encounter  Procedures  . Large Joint Inj: R knee  . XR KNEE 3 VIEW RIGHT   No orders of the defined types were placed in this encounter.     Procedures: Large Joint Inj: R knee on 07/24/2018 12:46 PM Indications: pain Details: 22 G needle, anterolateral approach Medications: 2 mL bupivacaine 0.25 %; 2 mL lidocaine 1 %; 40 mg methylPREDNISolone acetate 40 MG/ML      Clinical Data: No additional findings.   Subjective: Chief Complaint  Patient presents with  . Right Knee - Injury, Pain    HPI patient is a pleasant 58 year old gentleman who presents our clinic today with right knee pain.  History of bilateral knee arthroscopic debridement medial meniscus tears.  Last surgical intervention was approximately 2 years ago by surgeon at the [place].  He was doing well until about a month or so ago.  No new injury or change in activity.  The pain he has is to the medial aspect.  He describes this as an occasional sharp shooting pain with certain motions.  He is unsure which type of motion seem to elicit the pain.  No locking, catching or instability.  He has noticed occasional swelling as well.  He has been going to acupuncture which he thinks helps.  He has had multiple previous cortisone injections but it is been several years since the last injection.  Review of  Systems as detailed in HPI.  All others reviewed and are negative.   Objective: Vital Signs: There were no vitals taken for this visit.  Physical Exam well-developed well-nourished gentleman no acute distress.  Alert and oriented x3.  Ortho Exam examination of his right knee shows no effusion.  Range of motion 0 to 125 degrees.  Mild patellofemoral crepitus.  No joint line tenderness.  Negative medial McMurray.  He is stable valgus varus stress.  Is neurovascular intact distally.  Specialty Comments:  No specialty comments available.  Imaging: Xr Knee 3 View Right  Result Date: 07/24/2018 X-rays demonstrate 50% joint space narrowing medial compartment    PMFS History: Patient Active Problem List   Diagnosis Date Noted  . Chronic pain of right knee 07/24/2018   Past Medical History:  Diagnosis Date  . Back pain   . Environmental allergies   . Knee problem     Family History  Problem Relation Age of Onset  . Congestive Heart Failure Mother   . Lung cancer Father        He was a smoker.  . Congestive Heart Failure Sister   . Colon cancer Brother   . Congestive Heart Failure Sister     Past Surgical History:  Procedure  Laterality Date  . APPENDECTOMY    . KNEE SURGERY Left    Multiple Knee surgeries   . WISDOM TOOTH EXTRACTION     Social History   Occupational History  . Not on file  Tobacco Use  . Smoking status: Never Smoker  . Smokeless tobacco: Never Used  Substance and Sexual Activity  . Alcohol use: Yes    Comment: socially  . Drug use: No  . Sexual activity: Never

## 2018-08-20 ENCOUNTER — Ambulatory Visit (INDEPENDENT_AMBULATORY_CARE_PROVIDER_SITE_OTHER): Payer: PRIVATE HEALTH INSURANCE | Admitting: *Deleted

## 2018-08-20 DIAGNOSIS — J309 Allergic rhinitis, unspecified: Secondary | ICD-10-CM

## 2018-08-22 ENCOUNTER — Telehealth: Payer: Self-pay

## 2018-08-22 NOTE — Telephone Encounter (Signed)
Called and left a VM for patient to CB to confirm appointment for Friday, 08/23/2018.

## 2018-08-23 ENCOUNTER — Other Ambulatory Visit: Payer: Self-pay

## 2018-08-23 ENCOUNTER — Ambulatory Visit (INDEPENDENT_AMBULATORY_CARE_PROVIDER_SITE_OTHER): Payer: PRIVATE HEALTH INSURANCE | Admitting: Orthopaedic Surgery

## 2018-08-23 ENCOUNTER — Telehealth: Payer: Self-pay

## 2018-08-23 ENCOUNTER — Encounter: Payer: Self-pay | Admitting: Orthopaedic Surgery

## 2018-08-23 DIAGNOSIS — G8929 Other chronic pain: Secondary | ICD-10-CM

## 2018-08-23 DIAGNOSIS — M25561 Pain in right knee: Secondary | ICD-10-CM | POA: Diagnosis not present

## 2018-08-23 NOTE — Telephone Encounter (Signed)
Please submit for gel injection - Right knee- Dr Roda Shutters.    FYI patient will be getting MRI first. Should follow up after MRI. Depending what MRI shows we will give injection then. Please go ahead and submit for gel injection. Thanks.

## 2018-08-23 NOTE — Progress Notes (Signed)
Office Visit Note   Patient: Craig Lopez           Date of Birth: Mar 25, 1961           MRN: [id number] Visit Date: 08/23/2018              Requested by: Charolett Bumpers, PA-C [address] [place], [place] [address] PCP: Charolett Bumpers, PA-C   Assessment & Plan: Visit Diagnoses:  1. Chronic pain of right knee     Plan: Impression is right knee arthritis flareup versus degenerative medial meniscus tear.  We will obtain an MRI to further evaluate for this.  We will also get approval for [employer] supplementation injection.  He will follow-up with [place] once the MRIs been completed to discuss results.   This patient is diagnosed with osteoarthritis of the knee(s).    Radiographs show evidence of joint space narrowing, osteophytes, subchondral sclerosis and/or subchondral cysts.  This patient has knee pain which interferes with functional and activities of daily living.    This patient has experienced inadequate response, adverse effects and/or intolerance with conservative treatments such as acetaminophen, NSAIDS, topical creams, physical therapy or regular exercise, knee bracing and/or weight loss.   This patient has experienced inadequate response or has a contraindication to intra articular steroid injections for at least 3 months.   This patient is not scheduled to have a total knee replacement within 6 months of starting treatment with viscosupplementation.   Follow-Up Instructions: Return in about 3 weeks (around 09/13/2018) for after MRI.   Orders:  No orders of the defined types were placed in this encounter.  No orders of the defined types were placed in this encounter.     Procedures: No procedures performed   Clinical Data: No additional findings.   Subjective: Chief Complaint  Patient presents with  . Right Knee - Pain, Follow-up    HPI patient is a 58 year old gentleman who presents our clinic today with recurrent right knee pain.  He was seen in our office  around a month ago for this.  Intra-articular cortisone injection performed.  He noticed only relief during the anesthetic phase.  He has had continued sharp shooting pains to the medial aspect with pivoting motions.  No locking, catching or instability.  He has been wearing a brace with minimal relief.    Review of Systems as detailed in HPI.  All other review and are negative.   Objective: Vital Signs: There were no vitals taken for this visit.  Physical Exam well-developed and well-nourished gentleman in no acute distress.  Alert and oriented x3.  Ortho Exam examination of the right knee shows no effusion.  Range of motion 0 to 125 degrees.  Minimal medial joint line tenderness.  Cruciates and collaterals are stable.  He is neurovascular intact distally.  Specialty Comments:  No specialty comments available.  Imaging: Previous imaging showed 50% joint space narrowing medial compartment.   PMFS History: Patient Active Problem List   Diagnosis Date Noted  . Chronic pain of right knee 07/24/2018   Past Medical History:  Diagnosis Date  . Back pain   . Environmental allergies   . Knee problem     Family History  Problem Relation Age of Onset  . Congestive Heart Failure Mother   . Lung cancer Father        He was a smoker.  . Congestive Heart Failure Sister   . Colon cancer Brother   . Congestive Heart Failure Sister  Past Surgical History:  Procedure Laterality Date  . APPENDECTOMY    . KNEE SURGERY Left    Multiple Knee surgeries   . WISDOM TOOTH EXTRACTION     Social History   Occupational History  . Not on file  Tobacco Use  . Smoking status: Never Smoker  . Smokeless tobacco: Never Used  Substance and Sexual Activity  . Alcohol use: Yes    Comment: socially  . Drug use: No  . Sexual activity: Never

## 2018-08-26 NOTE — Telephone Encounter (Signed)
Noted  

## 2018-08-28 ENCOUNTER — Telehealth: Payer: Self-pay

## 2018-08-28 NOTE — Progress Notes (Signed)
VIALS EXP 08-28-2019 

## 2018-08-28 NOTE — Telephone Encounter (Signed)
Submitted VOB for Monovisc, right knee. 

## 2018-08-29 DIAGNOSIS — J3089 Other allergic rhinitis: Secondary | ICD-10-CM | POA: Diagnosis not present

## 2018-09-03 ENCOUNTER — Telehealth: Payer: Self-pay

## 2018-09-03 DIAGNOSIS — J301 Allergic rhinitis due to pollen: Secondary | ICD-10-CM | POA: Diagnosis not present

## 2018-09-03 NOTE — Telephone Encounter (Signed)
Received VM from [employer] representative concerning patient's insurance, office address, and Tax ID#.    Returned call to representative at [phone] ext. [phone] and left a VM clarifying patient's insurance, office address, and tax id# and to return my call.

## 2018-09-06 ENCOUNTER — Telehealth: Payer: Self-pay

## 2018-09-06 NOTE — Telephone Encounter (Signed)
Talked with patient and advised him that he is approved for gel injection.  Approved for Monovisc, right knee. Buy & Bill Covered at 100% through his insurance. No Co-pay No PA required  Appt. 09/13/2018

## 2018-09-07 ENCOUNTER — Ambulatory Visit
Admission: RE | Admit: 2018-09-07 | Discharge: 2018-09-07 | Disposition: A | Discharge status: Home / Self Care | Payer: Non-veteran care | Source: Ambulatory Visit | Attending: Physician Assistant | Admitting: Physician Assistant

## 2018-09-07 ENCOUNTER — Other Ambulatory Visit: Payer: Self-pay

## 2018-09-07 DIAGNOSIS — G8929 Other chronic pain: Secondary | ICD-10-CM

## 2018-09-10 NOTE — Progress Notes (Signed)
Fu to discuss mri.  Since we will likely be doing gel injection at fu appt if still in pain, will you let him know that it would save him a trip if he waited to come discuss mri once his gel injection has been approved?

## 2018-09-13 ENCOUNTER — Ambulatory Visit: Payer: Non-veteran care | Admitting: Orthopaedic Surgery

## 2018-09-17 ENCOUNTER — Ambulatory Visit (INDEPENDENT_AMBULATORY_CARE_PROVIDER_SITE_OTHER): Payer: PRIVATE HEALTH INSURANCE | Admitting: *Deleted

## 2018-09-17 ENCOUNTER — Ambulatory Visit (INDEPENDENT_AMBULATORY_CARE_PROVIDER_SITE_OTHER): Payer: PRIVATE HEALTH INSURANCE | Admitting: Orthopaedic Surgery

## 2018-09-17 ENCOUNTER — Other Ambulatory Visit: Payer: Self-pay

## 2018-09-17 ENCOUNTER — Encounter: Payer: Self-pay | Admitting: Orthopaedic Surgery

## 2018-09-17 DIAGNOSIS — G8929 Other chronic pain: Secondary | ICD-10-CM

## 2018-09-17 DIAGNOSIS — J309 Allergic rhinitis, unspecified: Secondary | ICD-10-CM

## 2018-09-17 DIAGNOSIS — M1711 Unilateral primary osteoarthritis, right knee: Secondary | ICD-10-CM | POA: Diagnosis not present

## 2018-09-17 DIAGNOSIS — M25561 Pain in right knee: Secondary | ICD-10-CM | POA: Diagnosis not present

## 2018-09-17 MED ORDER — HYALURONAN 88 MG/4ML IX SOSY
88.0000 mg | PREFILLED_SYRINGE | INTRA_ARTICULAR | Status: AC | PRN
  Administered 2018-09-17: 88 mg via INTRA_ARTICULAR

## 2018-09-17 MED ORDER — BUPIVACAINE HCL 0.25 % IJ SOLN
2.0000 mL | INTRAMUSCULAR | Status: AC | PRN
  Administered 2018-09-17: 2 mL via INTRA_ARTICULAR

## 2018-09-17 MED ORDER — LIDOCAINE HCL 1 % IJ SOLN
2.0000 mL | INTRAMUSCULAR | Status: AC | PRN
  Administered 2018-09-17: 2 mL

## 2018-09-17 NOTE — Progress Notes (Signed)
   Office Visit Note   Patient: Craig Lopez           Date of Birth: Nov 01, 1960           MRN: [id number] Visit Date: 09/17/2018              Requested by: Charolett Bumpers, PA-C [address] [place], [place] [address] PCP: Charolett Bumpers, PA-C   Assessment & Plan: Visit Diagnoses:  1. Chronic pain of right knee     Plan: Impression is chronic right knee pain.  We will inject the right knee with Monovisc today.  He will follow-up with [place] as needed.  Call with concerns or questions in the meantime.  Follow-Up Instructions: Return if symptoms worsen or fail to improve.   Orders:  Orders Placed This Encounter  Procedures  . Large Joint Inj: R knee   No orders of the defined types were placed in this encounter.     Procedures: Large Joint Inj: R knee on 09/17/2018 3:49 PM Indications: pain Details: 22 G needle, anterolateral approach Medications: 2 mL bupivacaine 0.25 %; 88 mg Hyaluronan 88 MG/4ML; 2 mL lidocaine 1 %      Clinical Data: No additional findings.   Subjective: Chief Complaint  Patient presents with  . Right Knee - Pain    HPI patient is a pleasant 58 year old gentleman who presents our clinic today to discuss MRI results of the right knee and for right knee Monovisc injection.  He has had sharp shooting pains the medial aspect of his right knee for a while now.  Previous cortisone injection was of minimal relief.  MRI ordered which showed mild chondral thinning to the medial compartment and trochlea.  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  Review of Systems as detailed in HPI.  All others reviewed and are negative.   Objective: Vital Signs: There were no vitals taken for this visit.  Physical Exam well-developed well-nourished gentleman in no acute distress.  Alert and oriented x3.  Ortho Exam stable exam  of the right knee  Specialty Comments:  No specialty comments available.  Imaging: No new imaging   PMFS History: Patient Active Problem List   Diagnosis Date Noted  . Chronic pain of right knee 07/24/2018   Past Medical History:  Diagnosis Date  . Back pain   . Environmental allergies   . Knee problem     Family History  Problem Relation Age of Onset  . Congestive Heart Failure Mother   . Lung cancer Father        He was a smoker.  . Congestive Heart Failure Sister   . Colon cancer Brother   . Congestive Heart Failure Sister     Past Surgical History:  Procedure Laterality Date  . APPENDECTOMY    . KNEE SURGERY Left    Multiple Knee surgeries   . WISDOM TOOTH EXTRACTION     Social History   Occupational History  . Not on file  Tobacco Use  . Smoking status: Never Smoker  . Smokeless tobacco: Never Used  Substance and Sexual Activity  . Alcohol use: Yes    Comment: socially  . Drug use: No  . Sexual activity: Never

## 2018-10-04 ENCOUNTER — Telehealth: Payer: Self-pay | Admitting: Orthopaedic Surgery

## 2018-10-04 NOTE — Telephone Encounter (Signed)
Patient called advised the injection did not work and he is still having a lot of pain in his right knee    The number to contact patient is [phone]

## 2018-10-04 NOTE — Telephone Encounter (Signed)
See message below °

## 2018-10-04 NOTE — Telephone Encounter (Signed)
He should come in for another evaluation

## 2018-10-07 ENCOUNTER — Telehealth: Payer: Self-pay | Admitting: Orthopaedic Surgery

## 2018-10-07 NOTE — Telephone Encounter (Signed)
Called patient left message on voicemail his appointment is scheduled 10/10/2018 at 10:00am with Dr Erlinda Hong

## 2018-10-07 NOTE — Telephone Encounter (Signed)
Called patient no answer LMOM to return  my call. Just need to advise on message below. And needs appt with Dr. Erlinda Hong.

## 2018-10-08 NOTE — Telephone Encounter (Signed)
Mrs. Craig Lopez made appt for patient.

## 2018-10-08 NOTE — Telephone Encounter (Signed)
Noted  

## 2018-10-10 ENCOUNTER — Other Ambulatory Visit: Payer: Self-pay

## 2018-10-10 ENCOUNTER — Ambulatory Visit (INDEPENDENT_AMBULATORY_CARE_PROVIDER_SITE_OTHER): Payer: PRIVATE HEALTH INSURANCE | Admitting: Orthopaedic Surgery

## 2018-10-10 ENCOUNTER — Encounter: Payer: Self-pay | Admitting: Orthopaedic Surgery

## 2018-10-10 DIAGNOSIS — M25561 Pain in right knee: Secondary | ICD-10-CM

## 2018-10-10 DIAGNOSIS — G8929 Other chronic pain: Secondary | ICD-10-CM | POA: Diagnosis not present

## 2018-10-10 NOTE — Progress Notes (Signed)
Office Visit Note   Patient: Craig Lopez           Date of Birth: 30-Jan-1961           MRN: [id number] Visit Date: 10/10/2018              Requested by: Merry Proud, PA-C [place],  [place] [address] PCP: Merry Proud, PA-C   Assessment & Plan: Visit Diagnoses:  1. 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  He states the pain is identical to his prior meniscal tears.  The patient has failed conservative treatment options and would like to proceed with right knee arthroscopic debridement medial meniscus.  We will need to get approval from the [place] before proceeding.  Risks, benefits and possible operations reviewed.  Rehab and recovery time discussed all questions answered. Total face to face encounter time was greater than 25 minutes and over half of this time was spent in counseling and/or coordination of care.  Follow-Up Instructions: Return 2 weeks post-op.   Orders:  No orders of the defined types were placed in this encounter.  No orders of the defined types were placed in this encounter.     Procedures: No procedures performed   Clinical Data: No additional findings.   Subjective: No chief complaint on file.   HPI patient is a pleasant 58 year old gentleman who presents our clinic today with continued right knee pain.  History of right knee arthroscopic debridement medial meniscus approximately 2 years ago.  He was seen in our office about 3 months ago with return of pain to the medial aspect of the knee.  The pain he has been having is been intermittent and sharp shooting in nature.  This does seem to occur with certain motions to include pivoting.  No catching or locking.  He has tried a cortisone injection as well as viscosupplementation injection all within the past few months without relief of symptoms.  He notes that this pain is identical to the pain he had when he had a  meniscus tear a few years ago.  MRI from 09/08/2018 showed postsurgical changes to the medial meniscus, nothing more.  Review of Systems as detailed in HPI.  All others reviewed and are negative.   Objective: Vital Signs: There were no vitals taken for this visit.  Physical Exam well-developed and well-nourished gentleman in no acute distress.  Alert and oriented x3.  Ortho Exam examination of the right knee shows no effusion.  Moderate tenderness of the medial joint line.  Ligaments are stable.  Is neurovascular intact distally.  Specialty Comments:  No specialty comments available.  Imaging: No new imaging   PMFS History: Patient Active Problem List   Diagnosis Date Noted   Chronic pain of right knee 07/24/2018   Past Medical History:  Diagnosis Date   Back pain    Environmental allergies    Knee problem     Family History  Problem Relation Age of Onset   Congestive Heart Failure Mother    Lung cancer Father        He was a smoker.   Congestive Heart Failure Sister    Colon cancer Brother    Congestive Heart Failure Sister     Past Surgical History:  Procedure Laterality Date   APPENDECTOMY     KNEE SURGERY Left    Multiple Knee surgeries    WISDOM TOOTH EXTRACTION  Social History   Occupational History   Not on file  Tobacco Use   Smoking status: Never Smoker   Smokeless tobacco: Never Used  Substance and Sexual Activity   Alcohol use: Yes    Comment: socially   Drug use: No   Sexual activity: Never

## 2018-10-12 ENCOUNTER — Other Ambulatory Visit: Payer: Self-pay

## 2018-10-12 ENCOUNTER — Emergency Department (HOSPITAL_COMMUNITY)
Admission: EM | Admit: 2018-10-12 | Discharge: 2018-10-12 | Disposition: A | Discharge status: Home / Self Care | Payer: No Typology Code available for payment source | Attending: Emergency Medicine | Admitting: Emergency Medicine

## 2018-10-12 ENCOUNTER — Emergency Department (HOSPITAL_COMMUNITY): Payer: No Typology Code available for payment source

## 2018-10-12 DIAGNOSIS — M109 Gout, unspecified: Secondary | ICD-10-CM | POA: Diagnosis not present

## 2018-10-12 DIAGNOSIS — M79672 Pain in left foot: Secondary | ICD-10-CM | POA: Diagnosis present

## 2018-10-12 MED ORDER — INDOMETHACIN 50 MG PO CAPS: ORAL_CAPSULE | ORAL | 0 refills | Status: DC

## 2018-10-12 MED ORDER — PREDNISONE 20 MG PO TABS: 40.0000 mg | ORAL_TABLET | Freq: Every day | ORAL | 0 refills | Status: DC

## 2018-10-12 MED ORDER — PREDNISONE 20 MG PO TABS
40.0000 mg | ORAL_TABLET | Freq: Once | ORAL | Status: AC
  Administered 2018-10-12: 19:00:00 40 mg via ORAL
  Filled 2018-10-12: qty 2

## 2018-10-12 MED ORDER — INDOMETHACIN 25 MG PO CAPS
50.0000 mg | ORAL_CAPSULE | Freq: Once | ORAL | Status: AC
  Administered 2018-10-12: 50 mg via ORAL
  Filled 2018-10-12: qty 2

## 2018-10-12 NOTE — ED Notes (Signed)
Patient verbalizes understanding of discharge instructions. Opportunity for questioning and answers were provided. Armband removed by staff, pt discharged from ED.  

## 2018-10-12 NOTE — ED Triage Notes (Signed)
Pt c/o left foot pain/toe x 4 days ; pt denies any trauma to the area and does report swelling to the area ; pt has + dorsalis pedis pulse

## 2018-10-12 NOTE — ED Provider Notes (Signed)
[facility] EMERGENCY DEPARTMENT Provider Note   CSN: [id number] Arrival date & time: 10/12/18  1815    History   Chief Complaint Chief Complaint  Patient presents with  . Foot Pain    HPI Craig Lopez is a 58 y.o. male.     The history is provided by the patient and medical records. No language interpreter was used.  Foot Pain Pertinent negatives include no chest pain and no shortness of breath.   Craig Lopez is a 58 y.o. male  with a PMH as listed below who presents to the Emergency Department complaining of left great toe pain which began 3 days ago.  Patient reports throbbing pain to the toe.  As the days have progressed, he is started to feel some pain to the inside of his ankle as well, but majority of the pain is localized right to the MTP area.  He does report swelling to this joint as well.  Denies any swelling at the ankle.  He feels as if there is warmth, but has not noticed any redness.  He denies any known injury or trauma.  He has not had any cuts or wounds on the foot.  He denies any history of this in the past.  No history of gout.  Not diabetic.  No prolonged immobilizations.  No recent travel or surgeries.  No fevers.   Past Medical History:  Diagnosis Date  . Back pain   . Environmental allergies   . Knee problem     Patient Active Problem List   Diagnosis Date Noted  . Chronic pain of right knee 07/24/2018    Past Surgical History:  Procedure Laterality Date  . APPENDECTOMY    . KNEE SURGERY Left    Multiple Knee surgeries   . WISDOM TOOTH EXTRACTION          Home Medications    Prior to Admission medications   Medication Sig Start Date End Date Taking? Authorizing Provider  Bepotastine Besilate (BEPREVE) 1.5 % SOLN Can use one drop in each eye twice daily if needed for itchy eyes. 08/03/15   Kozlow, Donnamarie Poag, MD  cyclobenzaprine (FLEXERIL) 10 MG tablet Take 10 mg by mouth 3 (three) times daily as needed for muscle spasms.     [provider]  EPINEPHrine 0.3 mg/0.3 mL IJ SOAJ injection Use as directed for life-threatening allergic reaction. 08/03/15   Kozlow, Donnamarie Poag, MD  fluticasone (FLONASE) 50 MCG/ACT nasal spray Place 1 spray into both nostrils daily.    [provider]  ibuprofen (ADVIL,MOTRIN) 800 MG tablet Take 800 mg by mouth every 8 (eight) hours as needed for pain.    [provider]  indomethacin (INDOCIN) 50 MG capsule Take 1 capsule (50 mg total) by mouth 2-3 times daily as needed for mild to moderate pain. 10/12/18   , Ozella Almond, PA-C  loratadine (CLARITIN) 10 MG tablet Take 10 mg by mouth daily.    [provider]  montelukast (SINGULAIR) 10 MG tablet Take 10 mg by mouth at bedtime as needed (allergies).     [provider]  ondansetron (ZOFRAN ODT) 8 MG disintegrating tablet Take 1 tablet (8 mg total) by mouth every 8 (eight) hours as needed for nausea or vomiting. 05/30/18   Molpus, John, MD  OxyCODONE HCl, Abuse Deter, (OXAYDO) 5 MG TABA Take 5 mg by mouth every 6 (six) hours as needed (severe pain).     [provider]  oxyCODONE-acetaminophen (  PERCOCET/ROXICET) 5-325 MG tablet Take 1 tablet by mouth every 4 (four) hours as needed for severe pain. 08/18/17   Petrucelli, Samantha R, PA-C  predniSONE (DELTASONE) 20 MG tablet Take 2 tablets (40 mg total) by mouth daily. 10/12/18   , Chase PicketJaime Pilcher, PA-C  QUEtiapine (SEROQUEL XR) 50 MG TB24 24 hr tablet Take 50 mg by mouth.    [provider]  tamsulosin (FLOMAX) 0.4 MG CAPS capsule Take 1 capsule (0.4 mg total) by mouth daily after breakfast. 08/18/17   Petrucelli, Pleas KochSamantha R, PA-C    Family History Family History  Problem Relation Age of Onset  . Congestive Heart Failure Mother   . Lung cancer Father        He was a smoker.  . Congestive Heart Failure Sister   . Colon cancer Brother   . Congestive Heart Failure Sister     Social History Social History   Tobacco Use  . Smoking  status: Never Smoker  . Smokeless tobacco: Never Used  Substance Use Topics  . Alcohol use: Yes    Comment: socially  . Drug use: No     Allergies   Patient has no known allergies.   Review of Systems Review of Systems  Constitutional: Negative for fever.  Respiratory: Negative for shortness of breath.   Cardiovascular: Negative for chest pain.  Musculoskeletal: Positive for arthralgias, joint swelling and myalgias.  Skin: Negative for color change and wound.  Neurological: Negative for weakness and numbness.     Physical Exam Updated Vital Signs BP 118/85   Pulse 80   Temp 98.2 F (36.8 C) (Oral)   Resp 14   SpO2 99%   Physical Exam Vitals signs and nursing note reviewed.  Constitutional:      General: He is not in acute distress.    Appearance: He is well-developed.  HENT:     Head: Normocephalic and atraumatic.  Neck:     Musculoskeletal: Neck supple.  Cardiovascular:     Rate and Rhythm: Normal rate and regular rhythm.     Heart sounds: Normal heart sounds. No murmur.  Pulmonary:     Effort: Pulmonary effort is normal. No respiratory distress.     Breath sounds: Normal breath sounds. No wheezing or rales.  Musculoskeletal:     Comments: Left foot MTP tender and warm to the touch.  Mild associated swelling.  Does have some diffuse tenderness to the medial ankle as well.  No wounds.  2+ DP.  Sensation intact.  Skin:    General: Skin is warm and dry.  Neurological:     Mental Status: He is alert.      ED Treatments / Results  Labs (all labs ordered are listed, but only abnormal results are displayed) Labs Reviewed - No data to display  EKG None  Radiology Dg Ankle Complete Left  Result Date: 10/12/2018 CLINICAL DATA:  Foot and ankle pain EXAM: LEFT ANKLE COMPLETE - 3+ VIEW; LEFT FOOT - COMPLETE 3+ VIEW COMPARISON:  None. FINDINGS: No fracture or dislocation of the left foot or left ankle. 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 Joint  spaces are otherwise well preserved. There is a moderate Achilles calcaneal spur. IMPRESSION: No fracture or dislocation of the left foot or left ankle. 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 Joint spaces are otherwise well preserved. There is a moderate Achilles calcaneal spur. Electronically Signed   By: Lauralyn PrimesAlex  Bibbey M.D.   On: 10/12/2018 19:08   Dg  Foot Complete Left  Result Date: 10/12/2018 CLINICAL DATA:  Foot and ankle pain EXAM: LEFT ANKLE COMPLETE - 3+ VIEW; LEFT FOOT - COMPLETE 3+ VIEW COMPARISON:  None. FINDINGS: No fracture or dislocation of the left foot or left ankle. 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 Joint spaces are otherwise well preserved. There is a moderate Achilles calcaneal spur. IMPRESSION: No fracture or dislocation of the left foot or left ankle. 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 Joint spaces are otherwise well preserved. There is a moderate Achilles calcaneal spur. Electronically Signed   By: Lauralyn PrimesAlex  Bibbey M.D.   On: 10/12/2018 19:08    Procedures Procedures (including critical care time)  Medications Ordered in ED Medications  predniSONE (DELTASONE) tablet 40 mg (40 mg Oral Given 10/12/18 1906)  indomethacin (INDOCIN) capsule 50 mg (50 mg Oral Given 10/12/18 1906)     Initial Impression / Assessment and Plan / ED Course  I have reviewed the triage vital signs and the nursing notes.  Pertinent labs & imaging results that were available during my care of the patient were reviewed by me and considered in my medical decision making (see chart for details).       Craig Lopez is a 58 y.o. male who presents to ED for evaluation of left MTP joint pain and swelling which has gotten worse over the last 3 days.  On exam, he does have tenderness, mild swelling and warmth to this area. X-ray without fracture. I feel this is likely gout. He has never had this before so should follow up with pcp. Septic joint possible, but he has no  risk factors for this and exam much more c/w gout given pain right at MTP joint. Will treat for gout flare, but discussed PCP follow up importance and return precautions as length. All questions answered.  Final Clinical Impressions(s) / ED Diagnoses   Final diagnoses:  Acute gout involving toe of left foot, unspecified cause    ED Discharge Orders         Ordered    predniSONE (DELTASONE) 20 MG tablet  Daily     10/12/18 1931    indomethacin (INDOCIN) 50 MG capsule     10/12/18 1931           , Chase PicketJaime Pilcher, PA-C 10/12/18 1956    Cathren LaineSteinl, Kevin, MD 10/13/18 [phone]

## 2018-10-12 NOTE — Discharge Instructions (Signed)
It was my pleasure taking care of you today!   Take prednisone and indomethacin as directed. Use your home pain medication as needed for severe pain.  Call your primary care doctor Monday to schedule a follow up appointment.   Return to ER for new or worsening symptoms, any additional concerns.

## 2018-10-17 ENCOUNTER — Ambulatory Visit (INDEPENDENT_AMBULATORY_CARE_PROVIDER_SITE_OTHER): Payer: PRIVATE HEALTH INSURANCE | Admitting: *Deleted

## 2018-10-17 DIAGNOSIS — J309 Allergic rhinitis, unspecified: Secondary | ICD-10-CM

## 2018-10-23 ENCOUNTER — Encounter (HOSPITAL_COMMUNITY): Payer: Self-pay

## 2018-10-23 ENCOUNTER — Emergency Department (HOSPITAL_COMMUNITY)
Admission: EM | Admit: 2018-10-23 | Discharge: 2018-10-23 | Disposition: A | Discharge status: Home / Self Care | Payer: No Typology Code available for payment source | Attending: Emergency Medicine | Admitting: Emergency Medicine

## 2018-10-23 ENCOUNTER — Other Ambulatory Visit: Payer: Self-pay

## 2018-10-23 DIAGNOSIS — Z79899 Other long term (current) drug therapy: Secondary | ICD-10-CM | POA: Diagnosis not present

## 2018-10-23 DIAGNOSIS — S8011XA Contusion of right lower leg, initial encounter: Secondary | ICD-10-CM | POA: Insufficient documentation

## 2018-10-23 DIAGNOSIS — X58XXXA Exposure to other specified factors, initial encounter: Secondary | ICD-10-CM | POA: Diagnosis not present

## 2018-10-23 DIAGNOSIS — T148XXA Other injury of unspecified body region, initial encounter: Secondary | ICD-10-CM

## 2018-10-23 DIAGNOSIS — Z23 Encounter for immunization: Secondary | ICD-10-CM | POA: Insufficient documentation

## 2018-10-23 DIAGNOSIS — L03115 Cellulitis of right lower limb: Secondary | ICD-10-CM | POA: Insufficient documentation

## 2018-10-23 DIAGNOSIS — Y999 Unspecified external cause status: Secondary | ICD-10-CM | POA: Diagnosis not present

## 2018-10-23 DIAGNOSIS — Y929 Unspecified place or not applicable: Secondary | ICD-10-CM | POA: Insufficient documentation

## 2018-10-23 DIAGNOSIS — Y939 Activity, unspecified: Secondary | ICD-10-CM | POA: Diagnosis not present

## 2018-10-23 DIAGNOSIS — S8991XA Unspecified injury of right lower leg, initial encounter: Secondary | ICD-10-CM | POA: Diagnosis present

## 2018-10-23 MED ORDER — LIDOCAINE-EPINEPHRINE (PF) 2 %-1:200000 IJ SOLN
10.0000 mL | Freq: Once | INTRAMUSCULAR | Status: AC
  Administered 2018-10-23: 10 mL via INTRADERMAL
  Filled 2018-10-23: qty 20

## 2018-10-23 MED ORDER — TETANUS-DIPHTH-ACELL PERTUSSIS 5-2.5-18.5 LF-MCG/0.5 IM SUSP
0.5000 mL | Freq: Once | INTRAMUSCULAR | Status: AC
  Administered 2018-10-23: 12:00:00 0.5 mL via INTRAMUSCULAR
  Filled 2018-10-23: qty 0.5

## 2018-10-23 MED ORDER — CEPHALEXIN 500 MG PO CAPS: 500.0000 mg | ORAL_CAPSULE | Freq: Four times a day (QID) | ORAL | 0 refills | Status: DC

## 2018-10-23 NOTE — ED Notes (Signed)
Suture cart outside room.  

## 2018-10-23 NOTE — ED Provider Notes (Signed)
Craig Lopez Provider Note   CSN: [id number] Arrival date & time: 10/23/18  1016     History   Chief Complaint Chief Complaint  Patient presents with  . Leg Pain    HPI Craig Lopez is a 58 y.o. male.     The history is provided by the patient. No language interpreter was used.  Leg Pain Associated symptoms: no fever      58 year old male presenting complaining of leg pain.  Patient report approximately 4-5 days ago he noticed a bruise area to his right lower extremity.  It has since increased in size, with surrounding redness and tenderness.  Tenderness sharp, throbbing, worsening with palpation.  Pain is moderate in severity.  No associated fever, no recent injury, no calf tenderness, no numbness or weakness, no insect bite.  He is unable to recall last tetanus status.  He mention been seen by his provider 2 days ago for this complaint.  It was felt that this could be a bruise from an injury and patient did not receive any antibiotics.  The redness portion of his leg was marked however since then it has since spread.  No prior history of PE or DVT, no prior history of gout  Past Medical History:  Diagnosis Date  . Back pain   . Environmental allergies   . Knee problem     Patient Active Problem List   Diagnosis Date Noted  . Chronic pain of right knee 07/24/2018    Past Surgical History:  Procedure Laterality Date  . APPENDECTOMY    . KNEE SURGERY Left    Multiple Knee surgeries   . WISDOM TOOTH EXTRACTION          Home Medications    Prior to Admission medications   Medication Sig Start Date End Date Taking? Authorizing Provider  Bepotastine Besilate (BEPREVE) 1.5 % SOLN Can use one drop in each eye twice daily if needed for itchy eyes. 08/03/15   Kozlow, Alvira PhilipsEric J, MD  cyclobenzaprine (FLEXERIL) 10 MG tablet Take 10 mg by mouth 3 (three) times daily as needed for muscle spasms.    [provider]  EPINEPHrine 0.3  mg/0.3 mL IJ SOAJ injection Use as directed for life-threatening allergic reaction. 08/03/15   Kozlow, Alvira PhilipsEric J, MD  fluticasone (FLONASE) 50 MCG/ACT nasal spray Place 1 spray into both nostrils daily.    [provider]  ibuprofen (ADVIL,MOTRIN) 800 MG tablet Take 800 mg by mouth every 8 (eight) hours as needed for pain.    [provider]  indomethacin (INDOCIN) 50 MG capsule Take 1 capsule (50 mg total) by mouth 2-3 times daily as needed for mild to moderate pain. 10/12/18   Ward, Chase PicketJaime Pilcher, PA-C  loratadine (CLARITIN) 10 MG tablet Take 10 mg by mouth daily.    [provider]  montelukast (SINGULAIR) 10 MG tablet Take 10 mg by mouth at bedtime as needed (allergies).     [provider]  ondansetron (ZOFRAN ODT) 8 MG disintegrating tablet Take 1 tablet (8 mg total) by mouth every 8 (eight) hours as needed for nausea or vomiting. 05/30/18   Molpus, John, MD  OxyCODONE HCl, Abuse Deter, (OXAYDO) 5 MG TABA Take 5 mg by mouth every 6 (six) hours as needed (severe pain).     [provider]  oxyCODONE-acetaminophen (PERCOCET/ROXICET) 5-325 MG tablet Take 1 tablet by mouth every 4 (four) hours as needed for severe pain. 08/18/17   Petrucelli, Pleas KochSamantha R,  PA-C  predniSONE (DELTASONE) 20 MG tablet Take 2 tablets (40 mg total) by mouth daily. 10/12/18   Ward, Chase PicketJaime Pilcher, PA-C  QUEtiapine (SEROQUEL XR) 50 MG TB24 24 hr tablet Take 50 mg by mouth.    [provider]  tamsulosin (FLOMAX) 0.4 MG CAPS capsule Take 1 capsule (0.4 mg total) by mouth daily after breakfast. 08/18/17   Petrucelli, Pleas KochSamantha R, PA-C    Family History Family History  Problem Relation Age of Onset  . Congestive Heart Failure Mother   . Lung cancer Father        He was a smoker.  . Congestive Heart Failure Sister   . Colon cancer Brother   . Congestive Heart Failure Sister     Social History Social History   Tobacco Use  . Smoking status: Never Smoker  . Smokeless tobacco:  Never Used  Substance Use Topics  . Alcohol use: Yes    Comment: socially  . Drug use: No     Allergies   Patient has no known allergies.   Review of Systems Review of Systems  Constitutional: Negative for fever.  Musculoskeletal: Positive for myalgias.  Skin: Positive for rash.     Physical Exam Updated Vital Signs BP 135/87 (BP Location: Right Arm)   Pulse 79   Temp 98 F (36.7 C) (Oral)   Ht 6\' 1"  (1.854 m)   Wt 114.3 kg   SpO2 98%   BMI 33.25 kg/m   Physical Exam Vitals signs and nursing note reviewed.  Constitutional:      General: He is not in acute distress.    Appearance: He is well-developed.  HENT:     Head: Atraumatic.  Eyes:     Conjunctiva/sclera: Conjunctivae normal.  Neck:     Musculoskeletal: Neck supple.  Musculoskeletal:        General: Tenderness (Right lower extremity: An area of induration and fluctuant approximately 3 cm in diameter noted to distal medial tib-fib with surrounding erythema and ecchymosis no calf tenderness.  Dorsalis pedis pulse palpable.  No joint involvement.) present.  Skin:    Findings: No rash.  Neurological:     Mental Status: He is alert.      ED Treatments / Results  Labs (all labs ordered are listed, but only abnormal results are displayed) Labs Reviewed - No data to display  EKG None  Radiology No results found.  Procedures .Marland Kitchen.Incision and Drainage  Date/Time: 10/23/2018 12:06 PM Performed by: Fayrene Helperran, Torri Michalski, PA-C Authorized by: Fayrene Helperran, Tynesia Harral, PA-C   Consent:    Consent obtained:  Verbal   Consent given by:  Patient   Risks discussed:  Bleeding, incomplete drainage, pain and damage to other organs   Alternatives discussed:  No treatment Universal protocol:    Procedure explained and questions answered to patient or proxy's satisfaction: yes     Relevant documents present and verified: yes     Test results available and properly labeled: yes     Imaging studies available: yes     Required blood  products, implants, devices, and special equipment available: yes     Site/side marked: yes     Immediately prior to procedure a time out was called: yes     Patient identity confirmed:  Verbally with patient Location:    Type:  Hematoma   Size:  2cm   Location:  Lower extremity   Lower extremity location:  Leg   Leg location:  R lower leg Pre-procedure details:  Skin preparation:  Betadine Anesthesia (see MAR for exact dosages):    Anesthesia method:  Local infiltration   Local anesthetic:  Lidocaine 1% WITH epi Procedure type:    Complexity:  Simple Procedure details:    Incision types:  Single straight   Incision depth:  Subcutaneous   Scalpel blade:  11   Wound management:  Probed and deloculated, irrigated with saline and extensive cleaning   Drainage:  Bloody   Drainage amount:  Scant   Packing materials:  None Post-procedure details:    Patient tolerance of procedure:  Tolerated well, no immediate complications   (including critical care time)  Medications Ordered in ED Medications  lidocaine-EPINEPHrine (XYLOCAINE W/EPI) 2 %-1:200000 (PF) injection 10 mL (has no administration in time range)  Tdap (BOOSTRIX) injection 0.5 mL (0.5 mLs Intramuscular Given 10/23/18 1130)     Initial Impression / Assessment and Plan / ED Course  I have reviewed the triage vital signs and the nursing notes.  Pertinent labs & imaging results that were available during my care of the patient were reviewed by me and considered in my medical decision making (see chart for details).        BP 135/87 (BP Location: Right Arm)   Pulse 79   Temp 98 F (36.7 C) (Oral)   Ht 6\' 1"  (1.854 m)   Wt 114.3 kg   SpO2 98%   BMI 33.25 kg/m    Final Clinical Impressions(s) / ED Diagnoses   Final diagnoses:  Cellulitis of right lower leg  Hematoma    ED Discharge Orders         Ordered    cephALEXin (KEFLEX) 500 MG capsule  4 times daily     10/23/18 1210         10:55 AM Pt here  with pain, swelling and redness noted to RLE.  Will perform I&D, plan to treat with abx.  Doubt DVT, suspect abscess/cellulitis.  Possible hematoma.  Doubt gout or septic joint.   12:08 PM Performed I&D, no purulent discharge.  Suspect potential atraumatic hematoma but given pain and redness, will provide abx to cover for potential cellulitis. Return precaution given.  Pt voice understanding and agrees with plan.    Domenic Moras, PA-C 10/23/18 1211    Carmin Muskrat, MD 10/23/18 [phone]

## 2018-10-23 NOTE — Discharge Instructions (Signed)
Please take antibiotic as prescribed.  Ice the area several times daily.  Follow up with your doctor for further evaluation.  Return if you have any concerns.

## 2018-10-23 NOTE — ED Notes (Signed)
Patient verbalizes understanding of discharge instructions. Opportunity for questioning and answers were provided. Pt discharged from ED. 

## 2018-10-23 NOTE — ED Triage Notes (Signed)
Right lower leg redness, began on Friday was seen at [facility] on Monday, no antibiotics prescribed.  Redness has continued to spread and patient reports a swelling on inside of leg.

## 2018-11-20 ENCOUNTER — Other Ambulatory Visit: Payer: Self-pay

## 2018-11-20 ENCOUNTER — Encounter (HOSPITAL_BASED_OUTPATIENT_CLINIC_OR_DEPARTMENT_OTHER): Payer: Self-pay | Admitting: *Deleted

## 2018-11-21 ENCOUNTER — Ambulatory Visit (INDEPENDENT_AMBULATORY_CARE_PROVIDER_SITE_OTHER): Payer: PRIVATE HEALTH INSURANCE | Admitting: *Deleted

## 2018-11-21 DIAGNOSIS — J309 Allergic rhinitis, unspecified: Secondary | ICD-10-CM

## 2018-11-23 ENCOUNTER — Other Ambulatory Visit (HOSPITAL_COMMUNITY)
Admission: RE | Admit: 2018-11-23 | Discharge: 2018-11-23 | Disposition: A | Discharge status: Home / Self Care | Payer: No Typology Code available for payment source | Source: Ambulatory Visit | Attending: Orthopaedic Surgery | Admitting: Orthopaedic Surgery

## 2018-11-23 DIAGNOSIS — S83241A Other tear of medial meniscus, current injury, right knee, initial encounter: Secondary | ICD-10-CM | POA: Insufficient documentation

## 2018-11-23 DIAGNOSIS — M94261 Chondromalacia, right knee: Secondary | ICD-10-CM | POA: Insufficient documentation

## 2018-11-23 DIAGNOSIS — Z01812 Encounter for preprocedural laboratory examination: Secondary | ICD-10-CM | POA: Diagnosis present

## 2018-11-23 DIAGNOSIS — Z20828 Contact with and (suspected) exposure to other viral communicable diseases: Secondary | ICD-10-CM | POA: Diagnosis not present

## 2018-11-23 DIAGNOSIS — M659 Synovitis and tenosynovitis, unspecified: Secondary | ICD-10-CM | POA: Diagnosis not present

## 2018-11-24 LAB — SARS CORONAVIRUS 2 (TAT 6-24 HRS): SARS Coronavirus 2: NEGATIVE

## 2018-11-27 ENCOUNTER — Encounter (HOSPITAL_BASED_OUTPATIENT_CLINIC_OR_DEPARTMENT_OTHER)
Admission: RE | Disposition: A | Discharge status: Home / Self Care | Payer: Self-pay | Source: Home / Self Care | Attending: Orthopaedic Surgery

## 2018-11-27 ENCOUNTER — Other Ambulatory Visit: Payer: Self-pay

## 2018-11-27 ENCOUNTER — Ambulatory Visit (HOSPITAL_BASED_OUTPATIENT_CLINIC_OR_DEPARTMENT_OTHER): Payer: No Typology Code available for payment source | Admitting: Certified Registered"

## 2018-11-27 ENCOUNTER — Ambulatory Visit (HOSPITAL_BASED_OUTPATIENT_CLINIC_OR_DEPARTMENT_OTHER)
Admission: RE | Admit: 2018-11-27 | Discharge: 2018-11-27 | Disposition: A | Discharge status: Home / Self Care | Payer: No Typology Code available for payment source | Attending: Orthopaedic Surgery | Admitting: Orthopaedic Surgery

## 2018-11-27 ENCOUNTER — Encounter: Payer: Self-pay | Admitting: Orthopaedic Surgery

## 2018-11-27 ENCOUNTER — Encounter (HOSPITAL_BASED_OUTPATIENT_CLINIC_OR_DEPARTMENT_OTHER): Payer: Self-pay | Admitting: Certified Registered"

## 2018-11-27 DIAGNOSIS — S83241A Other tear of medial meniscus, current injury, right knee, initial encounter: Secondary | ICD-10-CM | POA: Diagnosis present

## 2018-11-27 DIAGNOSIS — M659 Synovitis and tenosynovitis, unspecified: Secondary | ICD-10-CM | POA: Diagnosis not present

## 2018-11-27 DIAGNOSIS — G4730 Sleep apnea, unspecified: Secondary | ICD-10-CM | POA: Insufficient documentation

## 2018-11-27 DIAGNOSIS — M94261 Chondromalacia, right knee: Secondary | ICD-10-CM | POA: Diagnosis not present

## 2018-11-27 DIAGNOSIS — Z79899 Other long term (current) drug therapy: Secondary | ICD-10-CM | POA: Insufficient documentation

## 2018-11-27 HISTORY — PX: SYNOVECTOMY: SHX5180

## 2018-11-27 HISTORY — DX: Sleep apnea, unspecified: G47.30

## 2018-11-27 HISTORY — PX: CHONDROPLASTY: SHX5177

## 2018-11-27 HISTORY — PX: KNEE ARTHROSCOPY: SHX127

## 2018-11-27 LAB — BASIC METABOLIC PANEL
Anion gap: 9 (ref 5–15)
BUN: 11 mg/dL (ref 6–20)
CO2: 26 mmol/L (ref 22–32)
Calcium: 9.5 mg/dL (ref 8.9–10.3)
Chloride: 102 mmol/L (ref 98–111)
Creatinine, Ser: 1.15 mg/dL (ref 0.61–1.24)
GFR calc Af Amer: >60 mL/min (ref >60)
GFR calc non Af Amer: >60 mL/min (ref >60)
Glucose, Bld: 94 mg/dL (ref 70–99)
Potassium: 3.9 mmol/L (ref 3.5–5.1)
Sodium: 137 mmol/L (ref 135–145)

## 2018-11-27 SURGERY — ARTHROSCOPY, KNEE
Anesthesia: General | Site: Knee | Laterality: Right

## 2018-11-27 MED ORDER — ONDANSETRON HCL 4 MG/2ML IJ SOLN
INTRAMUSCULAR | Status: AC
  Filled 2018-11-27: qty 12

## 2018-11-27 MED ORDER — LIDOCAINE HCL (CARDIAC) PF 100 MG/5ML IV SOSY
PREFILLED_SYRINGE | INTRAVENOUS | Status: DC | PRN
  Administered 2018-11-27: 60 mg via INTRAVENOUS

## 2018-11-27 MED ORDER — MIDAZOLAM HCL 2 MG/2ML IJ SOLN
1.0000 mg | INTRAMUSCULAR | Status: DC | PRN
  Administered 2018-11-27: 2 mg via INTRAVENOUS

## 2018-11-27 MED ORDER — SODIUM CHLORIDE 0.9 % IR SOLN
Status: DC | PRN
  Administered 2018-11-27: 800 mL

## 2018-11-27 MED ORDER — DEXAMETHASONE SODIUM PHOSPHATE 10 MG/ML IJ SOLN
INTRAMUSCULAR | Status: DC | PRN
  Administered 2018-11-27: 10 mg via INTRAVENOUS

## 2018-11-27 MED ORDER — CHLORHEXIDINE GLUCONATE 4 % EX LIQD: 60.0000 mL | Freq: Once | CUTANEOUS | Status: DC

## 2018-11-27 MED ORDER — PROPOFOL 500 MG/50ML IV EMUL
INTRAVENOUS | Status: AC
  Filled 2018-11-27: qty 100

## 2018-11-27 MED ORDER — LIDOCAINE 2% (20 MG/ML) 5 ML SYRINGE
INTRAMUSCULAR | Status: AC
  Filled 2018-11-27: qty 15

## 2018-11-27 MED ORDER — BUPIVACAINE-EPINEPHRINE (PF) 0.5% -1:200000 IJ SOLN
INTRAMUSCULAR | Status: AC
  Filled 2018-11-27: qty 60

## 2018-11-27 MED ORDER — EPHEDRINE SULFATE 50 MG/ML IJ SOLN
INTRAMUSCULAR | Status: DC | PRN
  Administered 2018-11-27 (×2): 10 mg via INTRAVENOUS

## 2018-11-27 MED ORDER — FENTANYL CITRATE (PF) 100 MCG/2ML IJ SOLN
INTRAMUSCULAR | Status: AC
  Filled 2018-11-27: qty 2

## 2018-11-27 MED ORDER — CEFAZOLIN SODIUM-DEXTROSE 2-4 GM/100ML-% IV SOLN
INTRAVENOUS | Status: AC
  Filled 2018-11-27: qty 100

## 2018-11-27 MED ORDER — LACTATED RINGERS IV SOLN: INTRAVENOUS | Status: DC

## 2018-11-27 MED ORDER — FENTANYL CITRATE (PF) 100 MCG/2ML IJ SOLN
100.0000 ug | INTRAMUSCULAR | Status: DC | PRN
  Administered 2018-11-27: 09:00:00 100 ug via INTRAVENOUS

## 2018-11-27 MED ORDER — CEFAZOLIN SODIUM-DEXTROSE 2-4 GM/100ML-% IV SOLN
2.0000 g | INTRAVENOUS | Status: AC
  Administered 2018-11-27: 09:00:00 2 g via INTRAVENOUS

## 2018-11-27 MED ORDER — DEXAMETHASONE SODIUM PHOSPHATE 10 MG/ML IJ SOLN
INTRAMUSCULAR | Status: AC
  Filled 2018-11-27: qty 1

## 2018-11-27 MED ORDER — MIDAZOLAM HCL 2 MG/2ML IJ SOLN
INTRAMUSCULAR | Status: AC
  Filled 2018-11-27: qty 2

## 2018-11-27 MED ORDER — BUPIVACAINE HCL (PF) 0.25 % IJ SOLN
INTRAMUSCULAR | Status: DC | PRN
  Administered 2018-11-27: 30 mL

## 2018-11-27 MED ORDER — METHYLENE BLUE 0.5 % INJ SOLN
INTRAVENOUS | Status: AC
  Filled 2018-11-27: qty 20

## 2018-11-27 MED ORDER — LACTATED RINGERS IV SOLN
INTRAVENOUS | Status: DC
  Administered 2018-11-27 (×2): via INTRAVENOUS

## 2018-11-27 MED ORDER — BUPIVACAINE HCL (PF) 0.25 % IJ SOLN
INTRAMUSCULAR | Status: AC
  Filled 2018-11-27: qty 30

## 2018-11-27 MED ORDER — EPHEDRINE 5 MG/ML INJ
INTRAVENOUS | Status: AC
  Filled 2018-11-27: qty 10

## 2018-11-27 MED ORDER — PROPOFOL 10 MG/ML IV BOLUS
INTRAVENOUS | Status: DC | PRN
  Administered 2018-11-27: 200 mg via INTRAVENOUS

## 2018-11-27 MED ORDER — SODIUM CHLORIDE (PF) 0.9 % IJ SOLN
INTRAMUSCULAR | Status: AC
  Filled 2018-11-27: qty 20

## 2018-11-27 MED ORDER — ONDANSETRON HCL 4 MG/2ML IJ SOLN
INTRAMUSCULAR | Status: DC | PRN
  Administered 2018-11-27: 4 mg via INTRAVENOUS

## 2018-11-27 SURGICAL SUPPLY — 44 items
BANDAGE ESMARK 6X9 LF (GAUZE/BANDAGES/DRESSINGS) IMPLANT
BLADE CUDA GRT WHITE 3.5 (BLADE) IMPLANT
BLADE CUDA SHAVER 3.5 (BLADE) IMPLANT
BLADE CUTTER GATOR 3.5 (BLADE) IMPLANT
BLADE GREAT WHITE 4.2 (BLADE) IMPLANT
BLADE GREAT WHITE 4.2MM (BLADE)
BLADE LANZA CVD 15 DEG (BLADE) IMPLANT
BNDG CMPR 9X6 STRL LF SNTH (GAUZE/BANDAGES/DRESSINGS)
BNDG ELASTIC 6X5.8 VLCR STR LF (GAUZE/BANDAGES/DRESSINGS) ×8 IMPLANT
BNDG ESMARK 6X9 LF (GAUZE/BANDAGES/DRESSINGS)
COVER WAND RF STERILE (DRAPES) IMPLANT
CUFF TOURN SGL QUICK 34 (TOURNIQUET CUFF) ×4
CUFF TRNQT CYL 34X4.125X (TOURNIQUET CUFF) ×2 IMPLANT
DRAPE ARTHROSCOPY W/POUCH 90 (DRAPES) ×4 IMPLANT
DRAPE IMP U-DRAPE 54X76 (DRAPES) ×4 IMPLANT
DRAPE U-SHAPE 47X51 STRL (DRAPES) ×4 IMPLANT
DURAPREP 26ML APPLICATOR (WOUND CARE) ×4 IMPLANT
GAUZE SPONGE 4X4 12PLY STRL (GAUZE/BANDAGES/DRESSINGS) ×4 IMPLANT
GAUZE XEROFORM 1X8 LF (GAUZE/BANDAGES/DRESSINGS) ×4 IMPLANT
GLOVE BIOGEL PI IND STRL 7.0 (GLOVE) ×2 IMPLANT
GLOVE BIOGEL PI IND STRL 7.5 (GLOVE) ×1 IMPLANT
GLOVE BIOGEL PI INDICATOR 7.0 (GLOVE) ×2
GLOVE BIOGEL PI INDICATOR 7.5 (GLOVE) ×2
GLOVE ECLIPSE 7.0 STRL STRAW (GLOVE) ×4 IMPLANT
GLOVE EXAM NITRILE MD LF STRL (GLOVE) ×3 IMPLANT
GLOVE SKINSENSE NS SZ7.5 (GLOVE) ×4
GLOVE SKINSENSE STRL SZ7.5 (GLOVE) ×3 IMPLANT
GLOVE SURG SYN 7.5  E (GLOVE) ×2
GLOVE SURG SYN 7.5 E (GLOVE) ×2 IMPLANT
GLOVE SURG SYN 7.5 PF PI (GLOVE) ×1 IMPLANT
GOWN STRL REIN XL XLG (GOWN DISPOSABLE) ×4 IMPLANT
GOWN STRL REUS W/ TWL LRG LVL3 (GOWN DISPOSABLE) ×1 IMPLANT
GOWN STRL REUS W/ TWL XL LVL3 (GOWN DISPOSABLE) ×3 IMPLANT
GOWN STRL REUS W/TWL LRG LVL3 (GOWN DISPOSABLE)
GOWN STRL REUS W/TWL XL LVL3 (GOWN DISPOSABLE) ×8
KNEE WRAP E Z 3 GEL PACK (MISCELLANEOUS) ×4 IMPLANT
MANIFOLD NEPTUNE II (INSTRUMENTS) ×4 IMPLANT
PACK ARTHROSCOPY DSU (CUSTOM PROCEDURE TRAY) ×4 IMPLANT
PACK BASIN DAY SURGERY FS (CUSTOM PROCEDURE TRAY) ×4 IMPLANT
RESECTOR FULL RADIUS 4.2MM (BLADE) IMPLANT
SHAVER 4.2 MM LANZA 9391A (BLADE) ×4 IMPLANT
SUT ETHILON 3 0 PS 1 (SUTURE) ×4 IMPLANT
TOWEL GREEN STERILE FF (TOWEL DISPOSABLE) ×4 IMPLANT
TUBING ARTHRO INFLOW-ONLY STRL (TUBING) ×4 IMPLANT

## 2018-11-27 NOTE — Op Note (Addendum)
° °  Surgery Date: 11/27/2018  Surgeon(s): Leandrew Koyanagi, MD  ASSIST: none  ANESTHESIA:  general  FLUIDS: Per anesthesia record.   ESTIMATED BLOOD LOSS: minimal  PREOPERATIVE DIAGNOSES:  1. 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 Right knee synovitis  POSTOPERATIVE DIAGNOSES:  same  PROCEDURES PERFORMED:  1. Right knee arthroscopy with major synovectomy 2. Right knee arthroscopy with arthroscopic chondroplasty medial femoral condyle and trochlea.  DESCRIPTION OF PROCEDURE: Craig Lopez is a 58 y.o.-year-old male with right knee pain. Plans are to proceed with evaluation of medial meniscus and diagnostic arthroscopy with debridement as indicated. Full discussion held regarding risks benefits alternatives and complications related surgical intervention. Conservative care options reviewed. All questions answered.  The patient was identified in the preoperative holding area and the operative extremity was marked. The patient was brought to the operating room and transferred to operating table in a supine position. Satisfactory general anesthesia was induced by anesthesiology.    Standard anterolateral, anteromedial arthroscopy portals were obtained. The anteromedial portal was obtained with a spinal needle for localization under direct visualization with subsequent diagnostic findings.   Incisions were made for arthroscopy portals.  Diagnostic knee arthroscopy was first performed as well as a major synovectomy in all 3 compartments using oscillating shaver.  We then addressed the medial compartment.  With the knee in a valgus alignment I was able to probe the entire medial meniscus which did not appear to be torn in any fashion.  He did have a central lesion approximately 3 cm in diameter in the weightbearing surface of the medial femoral condyle which exhibited grade 3 almost grade IV chondromalacia with an unstable rim of cartilage.  I felt that this lesion was most  likely the cause of his pain and mechanical symptoms.  Chondroplasty was performed for this back to a stable and smooth border.  Given his age and level of chondromalacia I did not feel microfracture would benefit him.  We then evaluate the lateral compartment which was unremarkable.  The knee was then placed into full extension for evaluation of the patellofemoral compartment.  There are no loose bodies in the gutters.  He did have grade III chondromalacia centrally of the femoral trochlea.  The patella was relatively unremarkable.  Chondroplasty of the trochlea was performed back to stable border.  Excess fluid was removed from the knee joint.  30 cc of local was placed in the portal sites and in the joint.  Incisions were closed with interrupted nylon sutures.  Sterile dressings were applied.  Patient tolerated procedure well had no immediate complications.  Suprapatellar pouch and gutters: mild synovitis or debris. Patella chondral surface: Grade 1 Trochlear chondral surface: Grade 3 Patellofemoral tracking: normal Medial meniscus: unremarkable.  Medial femoral condyle weight bearing surface: Grade 3 Medial tibial plateau: Grade 1 Anterior cruciate ligament:stable Posterior cruciate ligament:stable Lateral meniscus: normal.   Lateral femoral condyle weight bearing surface: Grade 0 Lateral tibial plateau: Grade 0  DISPOSITION: The patient was awakened from general anesthetic, extubated, taken to the recovery room in medically stable condition, no apparent complications. The patient may be weightbearing as tolerated to the operative lower extremity.  Range of motion of right knee as tolerated.  Craig Cecil, MD [phone] 9:51 AM

## 2018-11-27 NOTE — Transfer of Care (Signed)
Immediate Anesthesia Transfer of Care Note  Patient: Craig Lopez  Procedure(s) Performed: RIGHT KNEE ARTHROSCOPY WITH PARTIAL MEDIAL MENISCECTOMY, SYNOVECTOMY (Right Knee)  Patient Location: PACU  Anesthesia Type:General  Level of Consciousness: sedated and patient cooperative  Airway & Oxygen Therapy: Patient Spontanous Breathing and Patient connected to face mask oxygen  Post-op Assessment: Report given to RN and Post -op Vital signs reviewed and stable  Post vital signs: Reviewed and stable  Last Vitals:  Vitals Value Taken Time  BP    Temp    Pulse 81 11/27/18 0953  Resp 14 11/27/18 0953  SpO2 100 % 11/27/18 0953  Vitals shown include unvalidated device data.  Last Pain:  Vitals:   11/27/18 0717  TempSrc: Oral  PainSc: 2          Complications: No apparent anesthesia complications

## 2018-11-27 NOTE — Anesthesia Postprocedure Evaluation (Signed)
Anesthesia Post Note  Patient: ELSON ULBRICH  Procedure(s) Performed: RIGHT KNEE ARTHROSCOPY WITH MAJOR SYNOVECTOMY AND CHONDROPLASTY MEDIAL FEMORAL CONDYLE AND TROCHLEA (Right Knee)     Patient location during evaluation: PACU Anesthesia Type: General Level of consciousness: awake and alert Pain management: pain level controlled Vital Signs Assessment: post-procedure vital signs reviewed and stable Respiratory status: spontaneous breathing, nonlabored ventilation and respiratory function stable Cardiovascular status: blood pressure returned to baseline and stable Postop Assessment: no apparent nausea or vomiting Anesthetic complications: no    Last Vitals:  Vitals:   11/27/18 1016 11/27/18 1043  BP:  138/86  Pulse: 90 89  Resp: 16 18  Temp:  36.9 C  SpO2: 99% 98%    Last Pain:  Vitals:   11/27/18 1043  TempSrc:   PainSc: 0-No pain   Pain Goal:                   Lidia Collum

## 2018-11-27 NOTE — H&P (Signed)
PREOPERATIVE H&P  Chief Complaint: right knee medial meniscal tear  HPI: Craig Lopez is a 58 y.o. male who presents for surgical treatment of right knee medial meniscal tear.  He denies any changes in medical history.  Past Medical History:  Diagnosis Date  . Back pain   . Environmental allergies   . Knee problem   . Sleep apnea    rarely uses his CPAP   Past Surgical History:  Procedure Laterality Date  . APPENDECTOMY    . KNEE SURGERY Left    Multiple Knee surgeries   . WISDOM TOOTH EXTRACTION     Social History   Socioeconomic History  . Marital status: Married    Spouse name: Not on file  . Number of children: Not on file  . Years of education: Not on file  . Highest education level: Not on file  Occupational History  . Not on file  Social Needs  . Financial resource strain: Not on file  . Food insecurity    Worry: Not on file    Inability: Not on file  . Transportation needs    Medical: Not on file    Non-medical: Not on file  Tobacco Use  . Smoking status: Never Smoker  . Smokeless tobacco: Never Used  Substance and Sexual Activity  . Alcohol use: Yes    Comment: socially  . Drug use: No  . Sexual activity: Never  Lifestyle  . Physical activity    Days per week: Not on file    Minutes per session: Not on file  . Stress: Not on file  Relationships  . Social Herbalist on phone: Not on file    Gets together: Not on file    Attends religious service: Not on file    Active member of club or organization: Not on file    Attends meetings of clubs or organizations: Not on file    Relationship status: Not on file  Other Topics Concern  . Not on file  Social History Narrative  . Not on file   Family History  Problem Relation Age of Onset  . Congestive Heart Failure Mother   . Lung cancer Father        He was a smoker.  . Congestive Heart Failure Sister   . Colon cancer Brother   . Congestive Heart Failure Sister    No Known  Allergies Prior to Admission medications   Medication Sig Start Date End Date Taking? Authorizing Provider  cyclobenzaprine (FLEXERIL) 10 MG tablet Take 10 mg by mouth 3 (three) times daily as needed for muscle spasms.   Yes [provider]  indomethacin (INDOCIN) 50 MG capsule Take 1 capsule (50 mg total) by mouth 2-3 times daily as needed for mild to moderate pain. 10/12/18  Yes Ward, Ozella Almond, PA-C  oxyCODONE-acetaminophen (PERCOCET/ROXICET) 5-325 MG tablet Take 1 tablet by mouth every 4 (four) hours as needed for severe pain. 08/18/17  Yes Petrucelli, Samantha R, PA-C  QUEtiapine (SEROQUEL XR) 50 MG TB24 24 hr tablet Take 50 mg by mouth.   Yes [provider]  Bepotastine Besilate (BEPREVE) 1.5 % SOLN Can use one drop in each eye twice daily if needed for itchy eyes. 08/03/15   Kozlow, Donnamarie Poag, MD  EPINEPHrine 0.3 mg/0.3 mL IJ SOAJ injection Use as directed for life-threatening allergic reaction. 08/03/15   Kozlow, Donnamarie Poag, MD  ibuprofen (ADVIL,MOTRIN) 800 MG tablet Take 800 mg by mouth every  8 (eight) hours as needed for pain.    [provider]  ondansetron (ZOFRAN ODT) 8 MG disintegrating tablet Take 1 tablet (8 mg total) by mouth every 8 (eight) hours as needed for nausea or vomiting. 05/30/18   Molpus, John, MD  OxyCODONE HCl, Abuse Deter, (OXAYDO) 5 MG TABA Take 5 mg by mouth every 6 (six) hours as needed (severe pain).     [provider]  tamsulosin (FLOMAX) 0.4 MG CAPS capsule Take 1 capsule (0.4 mg total) by mouth daily after breakfast. 08/18/17   Petrucelli, Samantha R, PA-C     Positive ROS: All other systems have been reviewed and were otherwise negative with the exception of those mentioned in the HPI and as above.  Physical Exam: General: Alert, no acute distress Cardiovascular: No pedal edema Respiratory: No cyanosis, no use of accessory musculature GI: abdomen soft Skin: No lesions in the area of chief complaint Neurologic: Sensation intact  distally Psychiatric: Patient is competent for consent with normal mood and affect Lymphatic: no lymphedema  MUSCULOSKELETAL: exam stable  Assessment: right knee medial meniscal tear  Plan: Plan for Procedure(s): RIGHT KNEE ARTHROSCOPY WITH PARTIAL MEDIAL MENISCECTOMY, SYNOVECTOMY  The risks benefits and alternatives were discussed with the patient including but not limited to the risks of nonoperative treatment, versus surgical intervention including infection, bleeding, nerve injury,  blood clots, cardiopulmonary complications, morbidity, mortality, among others, and they were willing to proceed.   Glee ArvinMichael Harlow Carrizales, MD   11/27/2018 8:01 AM

## 2018-11-27 NOTE — Anesthesia Procedure Notes (Signed)
Procedure Name: LMA Insertion Date/Time: 11/27/2018 9:08 AM Performed by: Signe Colt, CRNA Pre-anesthesia Checklist: Patient identified, Emergency Drugs available, Suction available and Patient being monitored Patient Re-evaluated:Patient Re-evaluated prior to induction Oxygen Delivery Method: Circle system utilized Preoxygenation: Pre-oxygenation with 100% oxygen Induction Type: IV induction Ventilation: Mask ventilation without difficulty LMA: LMA inserted LMA Size: 5.0 Number of attempts: 1 Airway Equipment and Method: Bite block Placement Confirmation: positive ETCO2 Tube secured with: Tape Dental Injury: Teeth and Oropharynx as per pre-operative assessment

## 2018-11-27 NOTE — Discharge Instructions (Signed)

## 2018-11-27 NOTE — Anesthesia Preprocedure Evaluation (Signed)
Anesthesia Evaluation  Patient identified by MRN, date of birth, ID band Patient awake    Reviewed: Allergy & Precautions, NPO status , Patient's Chart, lab work & pertinent test results  History of Anesthesia Complications Negative for: history of anesthetic complications  Airway Mallampati: II  TM Distance: >3 FB Neck ROM: Full    Dental  (+) Teeth Intact   Pulmonary sleep apnea ,    Pulmonary exam normal        Cardiovascular negative cardio ROS Normal cardiovascular exam     Neuro/Psych negative neurological ROS  negative psych ROS   GI/Hepatic negative GI ROS, Neg liver ROS,   Endo/Other  negative endocrine ROS  Renal/GU negative Renal ROS  negative genitourinary   Musculoskeletal negative musculoskeletal ROS (+)   Abdominal   Peds  Hematology negative hematology ROS (+)   Anesthesia Other Findings   Reproductive/Obstetrics                            Anesthesia Physical Anesthesia Plan  ASA: II  Anesthesia Plan: General   Post-op Pain Management:    Induction: Intravenous  PONV Risk Score and Plan: 2 and Ondansetron, Dexamethasone and Treatment may vary due to age or medical condition  Airway Management Planned: LMA  Additional Equipment: None  Intra-op Plan:   Post-operative Plan: Extubation in OR  Informed Consent: I have reviewed the patients History and Physical, chart, labs and discussed the procedure including the risks, benefits and alternatives for the proposed anesthesia with the patient or authorized representative who has indicated his/her understanding and acceptance.     Dental advisory given  Plan Discussed with:   Anesthesia Plan Comments:         Anesthesia Quick Evaluation

## 2018-11-29 ENCOUNTER — Encounter (HOSPITAL_BASED_OUTPATIENT_CLINIC_OR_DEPARTMENT_OTHER): Payer: Self-pay | Admitting: Orthopaedic Surgery

## 2018-12-02 ENCOUNTER — Encounter (HOSPITAL_BASED_OUTPATIENT_CLINIC_OR_DEPARTMENT_OTHER): Payer: Self-pay | Admitting: Orthopaedic Surgery

## 2018-12-05 ENCOUNTER — Telehealth: Payer: Self-pay

## 2018-12-05 NOTE — Telephone Encounter (Signed)
Patients authorization exp on 12/05/18. I have faxed a new auth to [facility]. I also called and left a voicemail to inform the patient he is due for a yearly follow up visit.   Patient is due back in 4 weeks for an allergy injection.

## 2018-12-10 ENCOUNTER — Encounter: Payer: Self-pay | Admitting: Orthopaedic Surgery

## 2018-12-10 ENCOUNTER — Ambulatory Visit (INDEPENDENT_AMBULATORY_CARE_PROVIDER_SITE_OTHER): Payer: No Typology Code available for payment source | Admitting: Orthopaedic Surgery

## 2018-12-10 DIAGNOSIS — Z9889 Other specified postprocedural states: Secondary | ICD-10-CM

## 2018-12-10 NOTE — Progress Notes (Signed)
   Post-Op Visit Note   Patient: Craig Lopez           Date of Birth: 1960-06-28           MRN: [id number] Visit Date: 12/10/2018 PCP: Merry Proud, PA-C   Assessment & Plan:  Chief Complaint:  Chief Complaint  Patient presents with  . Right Knee - Routine Post Op   Visit Diagnoses:  1. Status post arthroscopy of right knee     Plan: Mr. Bernhart is 2 weeks status post right knee arthroscopy chondroplasty.  A comes in for his first visit today.  He is doing well overall.  He is not taking any pain medicines.  Surgical incisions are healed.  Range of motion is 0 to 100 degrees.  No calf tenderness or bruising.  He has a small joint effusion.  No signs of infection.  Sutures were removed today.  Home exercises were provided for strengthening of his knee.  I would like to recheck him in another 4 weeks.  He did mention that he has gotten approval for [place] to evaluate his left knee through the [place].  Follow-Up Instructions: Return in about 4 weeks (around 01/07/2019).   Orders:  No orders of the defined types were placed in this encounter.  No orders of the defined types were placed in this encounter.   Imaging: No results found.  PMFS History: Patient Active Problem List   Diagnosis Date Noted  . Status post arthroscopy of right knee 12/10/2018  . Chondromalacia, right knee   . Synovitis of knee   . Acute medial meniscus tear, right, initial encounter 07/24/2018   Past Medical History:  Diagnosis Date  . Back pain   . Environmental allergies   . Knee problem   . Sleep apnea    rarely uses his CPAP    Family History  Problem Relation Age of Onset  . Congestive Heart Failure Mother   . Lung cancer Father        He was a smoker.  . Congestive Heart Failure Sister   . Colon cancer Brother   . Congestive Heart Failure Sister     Past Surgical History:  Procedure Laterality Date  . APPENDECTOMY    . CHONDROPLASTY Right 11/27/2018   Procedure: CHONDROPLASTY;  Surgeon:  Leandrew Koyanagi, MD;  Location: [place];  Service: Orthopedics;  Laterality: Right;  . KNEE ARTHROSCOPY Right 11/27/2018   Procedure: RIGHT KNEE ARTHROSCOPY WITH MAJOR SYNOVECTOMY AND CHONDROPLASTY MEDIAL FEMORAL CONDYLE AND TROCHLEA;  Surgeon: Leandrew Koyanagi, MD;  Location: [place];  Service: Orthopedics;  Laterality: Right;  . KNEE SURGERY Left    Multiple Knee surgeries   . SYNOVECTOMY Right 11/27/2018   Procedure: SYNOVECTOMY;  Surgeon: Leandrew Koyanagi, MD;  Location: [place];  Service: Orthopedics;  Laterality: Right;  . WISDOM TOOTH EXTRACTION     Social History   Occupational History  . Not on file  Tobacco Use  . Smoking status: Never Smoker  . Smokeless tobacco: Never Used  Substance and Sexual Activity  . Alcohol use: Yes    Comment: socially  . Drug use: No  . Sexual activity: Never

## 2018-12-19 ENCOUNTER — Ambulatory Visit: Payer: Self-pay | Admitting: *Deleted

## 2018-12-19 DIAGNOSIS — J309 Allergic rhinitis, unspecified: Secondary | ICD-10-CM | POA: Diagnosis not present

## 2018-12-24 ENCOUNTER — Ambulatory Visit: Payer: Self-pay | Admitting: *Deleted

## 2018-12-24 ENCOUNTER — Encounter: Payer: Self-pay | Admitting: Allergy and Immunology

## 2018-12-24 ENCOUNTER — Other Ambulatory Visit: Payer: Self-pay

## 2018-12-24 ENCOUNTER — Ambulatory Visit: Payer: No Typology Code available for payment source | Admitting: Allergy and Immunology

## 2018-12-24 VITALS — BP 142/100 | HR 81 | Temp 98.1°F | Resp 16 | Ht 73.0 in | Wt 264.0 lb

## 2018-12-24 DIAGNOSIS — J3089 Other allergic rhinitis: Secondary | ICD-10-CM | POA: Diagnosis not present

## 2018-12-24 DIAGNOSIS — I10 Essential (primary) hypertension: Secondary | ICD-10-CM

## 2018-12-24 DIAGNOSIS — J309 Allergic rhinitis, unspecified: Secondary | ICD-10-CM

## 2018-12-24 NOTE — Patient Instructions (Addendum)
  1.  Continue to perform allergen avoidance measures  2.  If needed:   A. loratadine or cetirizine 10 mg one tablet one time per day  B. Bepreve 1 drop each eye twice a day.    3. Continue immunotherapy and EpiPen  4. Return to clinic in 12 months or earlier if problem  5. Obtain fall flu vaccine (and COVID vaccine)  6. BP = 140/100

## 2018-12-24 NOTE — Progress Notes (Signed)
Craig Lopez   Follow-up Note  Referring Provider: Merry Proud, PA-C Primary Provider: Chancy Milroy Date of Office Visit: 12/24/2018  Subjective:   Tama High (DOB: 04-Aug-1960) is a 58 y.o. male who returns to the [facility] on 12/24/2018 in re-evaluation of the following:  HPI: Abubakr returns to this clinic in evaluation of allergic rhinoconjunctivitis treated with immunotherapy.  His last visit to this clinic was 03 July 2017.  Immunotherapy has worked great for him.  He no longer requires any medications for allergic disease.  He has no respiratory tract symptoms.  His immunotherapy is currently at every 4 weeks without any adverse effect.  Allergies as of 12/24/2018   No Known Allergies     Medication List    Bepotastine Besilate 1.5 % Soln Commonly known as: Bepreve Can use one drop in each eye twice daily if needed for itchy eyes.   cyclobenzaprine 10 MG tablet Commonly known as: FLEXERIL Take 10 mg by mouth 3 (three) times daily as needed for muscle spasms.   EPINEPHrine 0.3 mg/0.3 mL Soaj injection Commonly known as: EPI-PEN Use as directed for life-threatening allergic reaction.   ibuprofen 800 MG tablet Commonly known as: ADVIL Take 800 mg by mouth every 8 (eight) hours as needed for pain.   indomethacin 50 MG capsule Commonly known as: INDOCIN Take 1 capsule (50 mg total) by mouth 2-3 times daily as needed for mild to moderate pain.   ondansetron 8 MG disintegrating tablet Commonly known as: Zofran ODT Take 1 tablet (8 mg total) by mouth every 8 (eight) hours as needed for nausea or vomiting.   oxyCODONE-acetaminophen 5-325 MG tablet Commonly known as: PERCOCET/ROXICET Take 1 tablet by mouth every 4 (four) hours as needed for severe pain.   QUEtiapine 50 MG Tb24 24 hr tablet Commonly known as: SEROQUEL XR Take 50 mg by mouth.       Past Medical History:  Diagnosis  Date  . Back pain   . Environmental allergies   . Knee problem   . Sleep apnea    rarely uses his CPAP    Past Surgical History:  Procedure Laterality Date  . APPENDECTOMY    . CHONDROPLASTY Right 11/27/2018   Procedure: CHONDROPLASTY;  Surgeon: Leandrew Koyanagi, MD;  Location: [place];  Service: Orthopedics;  Laterality: Right;  . KNEE ARTHROSCOPY Right 11/27/2018   Procedure: RIGHT KNEE ARTHROSCOPY WITH MAJOR SYNOVECTOMY AND CHONDROPLASTY MEDIAL FEMORAL CONDYLE AND TROCHLEA;  Surgeon: Leandrew Koyanagi, MD;  Location: [place];  Service: Orthopedics;  Laterality: Right;  . KNEE SURGERY Left    Multiple Knee surgeries   . SYNOVECTOMY Right 11/27/2018   Procedure: SYNOVECTOMY;  Surgeon: Leandrew Koyanagi, MD;  Location: [place];  Service: Orthopedics;  Laterality: Right;  . WISDOM TOOTH EXTRACTION      Review of systems negative except as noted in HPI / PMHx or noted below:  Review of Systems  Constitutional: Negative.   HENT: Negative.   Eyes: Negative.   Respiratory: Negative.   Cardiovascular: Negative.   Gastrointestinal: Negative.   Genitourinary: Negative.   Musculoskeletal: Negative.   Skin: Negative.   Neurological: Negative.   Endo/Heme/Allergies: Negative.   Psychiatric/Behavioral: Negative.      Objective:   Vitals:   12/24/18 1359  BP: (!) 142/100  Pulse: 81  Resp: 16  Temp: 98.1 F (36.7 C)  SpO2:  97%   Height: 6\' 1"  (185.4 cm)  Weight: 264 lb (119.7 kg)   Physical Exam Constitutional:      Appearance: He is not diaphoretic.  HENT:     Head: Normocephalic.     Right Ear: Tympanic membrane, ear canal and external ear normal.     Left Ear: Tympanic membrane, ear canal and external ear normal.     Nose: Nose normal. No mucosal edema or rhinorrhea.     Mouth/Throat:     Pharynx: Uvula midline. No oropharyngeal exudate.  Eyes:     Conjunctiva/sclera: Conjunctivae normal.  Neck:     Thyroid: No  thyromegaly.     Trachea: Trachea normal. No tracheal tenderness or tracheal deviation.  Cardiovascular:     Rate and Rhythm: Normal rate and regular rhythm.     Heart sounds: Normal heart sounds, S1 normal and S2 normal. No murmur.  Pulmonary:     Effort: No respiratory distress.     Breath sounds: Normal breath sounds. No stridor. No wheezing or rales.  Lymphadenopathy:     Head:     Right side of head: No tonsillar adenopathy.     Left side of head: No tonsillar adenopathy.     Cervical: No cervical adenopathy.  Skin:    Findings: No erythema or rash.     Nails: There is no clubbing.   Neurological:     Mental Status: He is alert.     Diagnostics: none   Assessment and Plan:   1. Other allergic rhinitis   2. Essential hypertension     1.  Continue to perform allergen avoidance measures  2.  If needed:   A. loratadine or cetirizine 10 mg one tablet one time per day  B. Bepreve 1 drop each eye twice a day.    3. Continue immunotherapy and EpiPen  4. Return to clinic in 12 months or earlier if problem  5. Obtain fall flu vaccine (and COVID vaccine)  6. BP = 140/100  Dorma Russelldwin is really doing quite well on immunotherapy and he will continue this form of therapy and I will see him back in this clinic in 1 year or earlier if there is a problem.  We did inform him about his blood pressure of 140/100 and asked him to follow-up with his primary care doctor concerning further management of this issue to determine if this elevated reading is a trend.  Laurette SchimkeEric Kozlow, MD Allergy / Immunology [facility]

## 2018-12-25 ENCOUNTER — Encounter: Payer: Self-pay | Admitting: Allergy and Immunology

## 2018-12-30 ENCOUNTER — Other Ambulatory Visit: Payer: Self-pay | Admitting: *Deleted

## 2018-12-30 DIAGNOSIS — Z20822 Contact with and (suspected) exposure to covid-19: Secondary | ICD-10-CM

## 2018-12-31 LAB — NOVEL CORONAVIRUS, NAA: SARS-CoV-2, NAA: NOT DETECTED

## 2019-01-01 ENCOUNTER — Ambulatory Visit (INDEPENDENT_AMBULATORY_CARE_PROVIDER_SITE_OTHER): Payer: PRIVATE HEALTH INSURANCE | Admitting: *Deleted

## 2019-01-01 DIAGNOSIS — J309 Allergic rhinitis, unspecified: Secondary | ICD-10-CM

## 2019-01-07 ENCOUNTER — Other Ambulatory Visit: Payer: Self-pay

## 2019-01-07 ENCOUNTER — Encounter: Payer: Self-pay | Admitting: Orthopaedic Surgery

## 2019-01-07 ENCOUNTER — Ambulatory Visit (INDEPENDENT_AMBULATORY_CARE_PROVIDER_SITE_OTHER): Payer: PRIVATE HEALTH INSURANCE | Admitting: *Deleted

## 2019-01-07 ENCOUNTER — Ambulatory Visit (INDEPENDENT_AMBULATORY_CARE_PROVIDER_SITE_OTHER): Payer: No Typology Code available for payment source | Admitting: Physician Assistant

## 2019-01-07 DIAGNOSIS — Z9889 Other specified postprocedural states: Secondary | ICD-10-CM

## 2019-01-07 DIAGNOSIS — J309 Allergic rhinitis, unspecified: Secondary | ICD-10-CM

## 2019-01-07 NOTE — Progress Notes (Signed)
Post-Op Visit Note   Patient: Craig Lopez           Date of Birth: 1961/01/23           MRN: [id number] Visit Date: 01/07/2019 PCP: Merry Proud, PA-C   Assessment & Plan:  Chief Complaint:  Chief Complaint  Patient presents with  . Follow-up   Visit Diagnoses:  1. S/P right knee arthroscopy     Plan: Patient is a pleasant 58 year old gentleman who presents our clinic today 6 weeks status post right knee arthroscopic synovectomy and chondroplasty, date of surgery 11/27/2018.  It was noted during operative intervention that he had grade 3 changes to the medial and felt femoral compartments.  He notes that he is still having sharp shooting pains, but these are becoming more infrequent.  No fevers or chills.  He has not been doing any physical therapy or home exercise programs.  Examination of his right knee shows fully healed surgical scars.  No evidence of infection or cellulitis.  No effusion.  At this point, and I provided him a new home exercise program which he agrees to start.  I have also discussed the pain he is having is likely from the underlying arthritis.  He will continue to advance with activity as tolerated.  He will follow-up with [place] in 6 weeks time should his pain not continue to improve.  Call with concerns or questions in the meantime.  Follow-Up Instructions: Return if symptoms worsen or fail to improve.   Orders:  No orders of the defined types were placed in this encounter.  No orders of the defined types were placed in this encounter.   Imaging:   PMFS History: Patient Active Problem List   Diagnosis Date Noted  . Status post arthroscopy of right knee 12/10/2018  . Chondromalacia, right knee   . Synovitis of knee   . Acute medial meniscus tear, right, initial encounter 07/24/2018   Past Medical History:  Diagnosis Date  . Back pain   . Environmental allergies   . Knee problem   . Sleep apnea    rarely uses his CPAP    Family History   Problem Relation Age of Onset  . Congestive Heart Failure Mother   . Lung cancer Father        He was a smoker.  . Congestive Heart Failure Sister   . Colon cancer Brother   . Congestive Heart Failure Sister     Past Surgical History:  Procedure Laterality Date  . APPENDECTOMY    . CHONDROPLASTY Right 11/27/2018   Procedure: CHONDROPLASTY;  Surgeon: Leandrew Koyanagi, MD;  Location: [facility];  Service: Orthopedics;  Laterality: Right;  . KNEE ARTHROSCOPY Right 11/27/2018   Procedure: RIGHT KNEE ARTHROSCOPY WITH MAJOR SYNOVECTOMY AND CHONDROPLASTY MEDIAL FEMORAL CONDYLE AND TROCHLEA;  Surgeon: Leandrew Koyanagi, MD;  Location: [facility];  Service: Orthopedics;  Laterality: Right;  . KNEE SURGERY Left    Multiple Knee surgeries   . SYNOVECTOMY Right 11/27/2018   Procedure: SYNOVECTOMY;  Surgeon: Leandrew Koyanagi, MD;  Location: [facility];  Service: Orthopedics;  Laterality: Right;  . WISDOM TOOTH EXTRACTION     Social History   Occupational History  . Not on file  Tobacco Use  . Smoking status: Never Smoker  . Smokeless tobacco: Never Used  Substance and Sexual Activity  . Alcohol use: Yes    Comment: socially  . Drug use: No  .  Sexual activity: Never

## 2019-01-14 ENCOUNTER — Ambulatory Visit (INDEPENDENT_AMBULATORY_CARE_PROVIDER_SITE_OTHER): Payer: PRIVATE HEALTH INSURANCE | Admitting: *Deleted

## 2019-01-14 DIAGNOSIS — J309 Allergic rhinitis, unspecified: Secondary | ICD-10-CM

## 2019-02-18 ENCOUNTER — Ambulatory Visit (INDEPENDENT_AMBULATORY_CARE_PROVIDER_SITE_OTHER): Payer: PRIVATE HEALTH INSURANCE | Admitting: *Deleted

## 2019-02-18 DIAGNOSIS — J309 Allergic rhinitis, unspecified: Secondary | ICD-10-CM

## 2019-02-28 ENCOUNTER — Encounter: Payer: Self-pay | Admitting: Orthopaedic Surgery

## 2019-02-28 ENCOUNTER — Other Ambulatory Visit: Payer: Self-pay

## 2019-02-28 ENCOUNTER — Ambulatory Visit (INDEPENDENT_AMBULATORY_CARE_PROVIDER_SITE_OTHER): Payer: No Typology Code available for payment source | Admitting: Orthopaedic Surgery

## 2019-02-28 ENCOUNTER — Ambulatory Visit (INDEPENDENT_AMBULATORY_CARE_PROVIDER_SITE_OTHER): Payer: No Typology Code available for payment source

## 2019-02-28 VITALS — Ht 73.0 in | Wt 257.0 lb

## 2019-02-28 DIAGNOSIS — M25562 Pain in left knee: Secondary | ICD-10-CM | POA: Diagnosis not present

## 2019-02-28 DIAGNOSIS — G8929 Other chronic pain: Secondary | ICD-10-CM

## 2019-02-28 NOTE — Progress Notes (Signed)
Office Visit Note   Patient: Craig Lopez           Date of Birth: 14-May-1960           MRN: [id number] Visit Date: 02/28/2019              Requested by: Merry Proud, PA-C [place],  [place] [address] PCP: Merry Proud, PA-C   Assessment & Plan: Visit Diagnoses:  1. Chronic pain of left knee     Plan: Patient is doing well overall from his right knee scope.  At this point I believe that his pain is mainly a manifestation of his underlying DJD.  He has had multiple cortisone injections and viscosupplementation injections which have not given him much relief.  He is getting give this more time.  In terms of the left knee we will try to obtain the report and recent MRI so that we can better treat this.  I will call the patient with the results of the MRI once I receive them.  Follow-Up Instructions: Return if symptoms worsen or fail to improve.   Orders:  Orders Placed This Encounter  Procedures  . XR KNEE 3 VIEW LEFT   No orders of the defined types were placed in this encounter.     Procedures: No procedures performed   Clinical Data: No additional findings.   Subjective: Chief Complaint  Patient presents with  . Left Knee - Pain    Craig Lopez is 3 months status post right knee arthroscopy.  He states that the sharp pain has resolved but he still has aching pain in the right knee he is ambulating with a cane.  His left knee is also bothering him significantly.  He states that the pain is worse than the right knee.  The pain is causing him significant difficulty with ADLs.  He recently had an MRI of the left knee at the [place] but the images and the report are not available for review today.   Review of Systems  Constitutional: Negative.   All other systems reviewed and are negative.    Objective: Vital Signs: Ht 6\' 1"  (1.854 m)   Wt 257 lb (116.6 kg)   BMI 33.91 kg/m   Physical Exam Vitals signs and nursing note reviewed.  Constitutional:       Appearance: He is well-developed.  Pulmonary:     Effort: Pulmonary effort is normal.  Abdominal:     Palpations: Abdomen is soft.  Skin:    General: Skin is warm.  Neurological:     Mental Status: He is alert and oriented to person, place, and time.  Psychiatric:        Behavior: Behavior normal.        Thought Content: Thought content normal.        Judgment: Judgment normal.     Ortho Exam Left knee exam shows trace joint effusion.  Painful range of motion.  2+ patellofemoral crepitus.  No joint line tenderness. Specialty Comments:  No specialty comments available.  Imaging: Xr Knee 3 View Left  Result Date: 02/28/2019 Moderate joint space narrowing and DJD.    PMFS History: Patient Active Problem List   Diagnosis Date Noted  . Status post arthroscopy of right knee 12/10/2018  . Chondromalacia, right knee   . Synovitis of knee   . Acute medial meniscus tear, right, initial encounter 07/24/2018   Past Medical History:  Diagnosis Date  . Back pain   .  Environmental allergies   . Knee problem   . Sleep apnea    rarely uses his CPAP    Family History  Problem Relation Age of Onset  . Congestive Heart Failure Mother   . Lung cancer Father        He was a smoker.  . Congestive Heart Failure Sister   . Colon cancer Brother   . Congestive Heart Failure Sister     Past Surgical History:  Procedure Laterality Date  . APPENDECTOMY    . CHONDROPLASTY Right 11/27/2018   Procedure: CHONDROPLASTY;  Surgeon: Tarry Kos, MD;  Location: [facility];  Service: Orthopedics;  Laterality: Right;  . KNEE ARTHROSCOPY Right 11/27/2018   Procedure: RIGHT KNEE ARTHROSCOPY WITH MAJOR SYNOVECTOMY AND CHONDROPLASTY MEDIAL FEMORAL CONDYLE AND TROCHLEA;  Surgeon: Tarry Kos, MD;  Location: [facility];  Service: Orthopedics;  Laterality: Right;  . KNEE SURGERY Left    Multiple Knee surgeries   . SYNOVECTOMY Right 11/27/2018   Procedure:  SYNOVECTOMY;  Surgeon: Tarry Kos, MD;  Location: [facility];  Service: Orthopedics;  Laterality: Right;  . WISDOM TOOTH EXTRACTION     Social History   Occupational History  . Not on file  Tobacco Use  . Smoking status: Never Smoker  . Smokeless tobacco: Never Used  Substance and Sexual Activity  . Alcohol use: Yes    Comment: socially  . Drug use: No  . Sexual activity: Never

## 2019-03-11 ENCOUNTER — Other Ambulatory Visit: Payer: Self-pay

## 2019-03-11 ENCOUNTER — Ambulatory Visit (INDEPENDENT_AMBULATORY_CARE_PROVIDER_SITE_OTHER): Payer: No Typology Code available for payment source | Admitting: Orthopaedic Surgery

## 2019-03-11 ENCOUNTER — Encounter: Payer: Self-pay | Admitting: Orthopaedic Surgery

## 2019-03-11 DIAGNOSIS — M1712 Unilateral primary osteoarthritis, left knee: Secondary | ICD-10-CM | POA: Diagnosis not present

## 2019-03-11 NOTE — Progress Notes (Signed)
Office Visit Note   Patient: Craig Lopez           Date of Birth: Jan 18, 1961           MRN: [id number] Visit Date: 03/11/2019              Requested by: Craig Proud, PA-C [place],  [place] [address] PCP: Craig Proud, PA-C   Assessment & Plan: Visit Diagnoses:  1. Primary osteoarthritis of left knee     Plan: I reviewed the MRI in detail with the patient today which shows tricompartmental DJD.  Based on our discussion today patient has elected to move forward with scheduling for a left total knee replacement.  Risk benefits alternatives were discussed and reviewed.  Rehab and recovery were also reviewed.  Patient will need a CPM and a cold therapy unit postoperatively to help with PT and rehab.  Patient denies history of DVT, nickel allergy. Total face to face encounter time was greater than 25 minutes and over half of this time was spent in counseling and/or coordination of care.  Follow-Up Instructions: Return for 2 week postop visit.   Orders:  No orders of the defined types were placed in this encounter.  No orders of the defined types were placed in this encounter.     Procedures: No procedures performed   Clinical Data: No additional findings.   Subjective: Chief Complaint  Patient presents with  . Left Knee - Pain, Follow-up    Craig Lopez is a 58 year old gentleman who follows up today for his left knee pain.  We have his recent left knee MRI to review.  So far he has had no significant relief from cortisone injections, viscosupplementation injections, oral NSAIDs, cane use and activity modification.   Review of Systems   Objective: Vital Signs: There were no vitals taken for this visit.  Physical Exam  Ortho Exam Left knee exam shows a trace joint effusion.  Good range of motion.  Close patients are stable. Specialty Comments:  No specialty comments available.  Imaging: No results found.   PMFS History: Patient Active  Problem List   Diagnosis Date Noted  . Status post arthroscopy of right knee 12/10/2018  . Chondromalacia, right knee   . Synovitis of knee   . Acute medial meniscus tear, right, initial encounter 07/24/2018   Past Medical History:  Diagnosis Date  . Back pain   . Environmental allergies   . Knee problem   . Sleep apnea    rarely uses his CPAP    Family History  Problem Relation Age of Onset  . Congestive Heart Failure Mother   . Lung cancer Father        He was a smoker.  . Congestive Heart Failure Sister   . Colon cancer Brother   . Congestive Heart Failure Sister     Past Surgical History:  Procedure Laterality Date  . APPENDECTOMY    . CHONDROPLASTY Right 11/27/2018   Procedure: CHONDROPLASTY;  Surgeon: Leandrew Koyanagi, MD;  Location: [facility];  Service: Orthopedics;  Laterality: Right;  . KNEE ARTHROSCOPY Right 11/27/2018   Procedure: RIGHT KNEE ARTHROSCOPY WITH MAJOR SYNOVECTOMY AND CHONDROPLASTY MEDIAL FEMORAL CONDYLE AND TROCHLEA;  Surgeon: Leandrew Koyanagi, MD;  Location: [facility];  Service: Orthopedics;  Laterality: Right;  . KNEE SURGERY Left    Multiple Knee surgeries   . SYNOVECTOMY Right 11/27/2018   Procedure: SYNOVECTOMY;  Surgeon: Leandrew Koyanagi,  MD;  Location: [facility];  Service: Orthopedics;  Laterality: Right;  . WISDOM TOOTH EXTRACTION     Social History   Occupational History  . Not on file  Tobacco Use  . Smoking status: Never Smoker  . Smokeless tobacco: Never Used  Substance and Sexual Activity  . Alcohol use: Yes    Comment: socially  . Drug use: No  . Sexual activity: Never

## 2019-03-18 ENCOUNTER — Ambulatory Visit (INDEPENDENT_AMBULATORY_CARE_PROVIDER_SITE_OTHER): Payer: PRIVATE HEALTH INSURANCE | Admitting: *Deleted

## 2019-03-18 DIAGNOSIS — J309 Allergic rhinitis, unspecified: Secondary | ICD-10-CM | POA: Diagnosis not present

## 2019-03-18 NOTE — Progress Notes (Signed)
[place], [place] [address] Phone: [phone] Fax: [phone]      Your procedure is scheduled on Monday, December 7th, 2020.   Report to [facility] Main Entrance "A" at 5:30 A.M., and check in at the Admitting office.   Call this number if you have problems the morning of surgery:  [phone]  Call [phone] if you have any questions prior to your surgery date Monday-Friday 8am-4pm    Remember:  Do not eat after midnight the night before your surgery  You may drink clear liquids until 4:15AM the morning of your surgery.    Clear liquids allowed are: Water, Non-Citrus Juices (without pulp), Carbonated Beverages, Clear Tea, Black Coffee Only, and Gatorade    Take these medicines the morning of surgery with A SIP OF WATER :  Cyclobenzaprine (Flexeril) - if needed Epinephrine injection - if needed Indomethacin (Indocin) - if needed Lubricating Plus Eye Drops - if needed Ondansetron (Zofran) - if needed Oxycodone IR/Roxicodone - if needed Venlafaxine (Effexor)  7 days prior to surgery STOP taking any Aspirin (unless otherwise instructed by your surgeon), Aleve, Naproxen, Ibuprofen, Motrin, Advil, Goody's, BC's, all herbal medications, fish oil, and all vitamins.    The Morning of Surgery  Do not wear jewelry, make-up or nail polish.  Do not wear lotions, powders, or perfumes/colognes, or deodorant  Do not shave 48 hours prior to surgery.  Men may shave face and neck.  Do not bring valuables to the hospital.  [facility] is not responsible for any belongings or valuables.  If you are a smoker, DO NOT Smoke 24 hours prior to surgery  If you wear a CPAP at night please bring your mask, tubing, and machine the morning of surgery   Remember that you must have someone to transport you home after your surgery, and remain with you for 24 hours if you are discharged the same day.   Please bring cases for  contacts, glasses, hearing aids, dentures or bridgework because it cannot be worn into surgery.    Leave your suitcase in the car.  After surgery it may be brought to your room.  For patients admitted to the hospital, discharge time will be determined by your treatment team.  Patients discharged the day of surgery will not be allowed to drive home.    Special instructions:   [facility]- Preparing For Surgery  Before surgery, you can play an important role. Because skin is not sterile, your skin needs to be as free of germs as possible. You can reduce the number of germs on your skin by washing with CHG (chlorahexidine gluconate) Soap before surgery.  CHG is an antiseptic cleaner which kills germs and bonds with the skin to continue killing germs even after washing.    Oral Hygiene is also important to reduce your risk of infection.  Remember - BRUSH YOUR TEETH THE MORNING OF SURGERY WITH YOUR REGULAR TOOTHPASTE  Please do not use if you have an allergy to CHG or antibacterial soaps. If your skin becomes reddened/irritated stop using the CHG.  Do not shave (including legs and underarms) for at least 48 hours prior to first CHG shower. It is OK to shave your face.  Please follow these instructions carefully.   1. Shower the NIGHT BEFORE SURGERY and the MORNING OF SURGERY with CHG Soap.   2. If you chose to wash your hair, wash your hair first as usual  with your normal shampoo.  3. After you shampoo, rinse your hair and body thoroughly to remove the shampoo.  4. Use CHG as you would any other liquid soap. You can apply CHG directly to the skin and wash gently with a scrungie or a clean washcloth.   5. Apply the CHG Soap to your body ONLY FROM THE NECK DOWN.  Do not use on open wounds or open sores. Avoid contact with your eyes, ears, mouth and genitals (private parts). Wash Face and genitals (private parts)  with your normal soap.   6. Wash thoroughly, paying special attention to the  area where your surgery will be performed.  7. Thoroughly rinse your body with warm water from the neck down.  8. DO NOT shower/wash with your normal soap after using and rinsing off the CHG Soap.  9. Pat yourself dry with a CLEAN TOWEL.  10. Wear CLEAN PAJAMAS to bed the night before surgery, wear comfortable clothes the morning of surgery  11. Place CLEAN SHEETS on your bed the night of your first shower and DO NOT SLEEP WITH PETS.    Day of Surgery:  Please shower the morning of surgery with the CHG soap Do not apply any deodorants/lotions. Please wear clean clothes to the hospital/surgery center.   Remember to brush your teeth WITH YOUR REGULAR TOOTHPASTE.   Please read over the following fact sheets that you were given.

## 2019-03-19 ENCOUNTER — Encounter (HOSPITAL_COMMUNITY)
Admission: RE | Admit: 2019-03-19 | Discharge: 2019-03-19 | Disposition: A | Discharge status: Home / Self Care | Payer: No Typology Code available for payment source | Source: Ambulatory Visit | Attending: Orthopaedic Surgery | Admitting: Orthopaedic Surgery

## 2019-03-19 ENCOUNTER — Ambulatory Visit (HOSPITAL_COMMUNITY)
Admission: RE | Admit: 2019-03-19 | Discharge: 2019-03-19 | Disposition: A | Discharge status: Home / Self Care | Payer: No Typology Code available for payment source | Source: Ambulatory Visit | Attending: Physician Assistant | Admitting: Physician Assistant

## 2019-03-19 ENCOUNTER — Encounter (HOSPITAL_COMMUNITY): Payer: Self-pay

## 2019-03-19 ENCOUNTER — Other Ambulatory Visit: Payer: Self-pay

## 2019-03-19 DIAGNOSIS — M1712 Unilateral primary osteoarthritis, left knee: Secondary | ICD-10-CM

## 2019-03-19 LAB — ABO/RH: ABO/RH(D): B POS

## 2019-03-19 LAB — CBC WITH DIFFERENTIAL/PLATELET
Abs Immature Granulocytes: 0.01 10*3/uL (ref 0.00–0.07)
Basophils Absolute: 0 10*3/uL (ref 0.0–0.1)
Basophils Relative: 1 %
Eosinophils Absolute: 0.3 10*3/uL (ref 0.0–0.5)
Eosinophils Relative: 9 %
HCT: 45.1 % (ref 39.0–52.0)
Hemoglobin: 14.1 g/dL (ref 13.0–17.0)
Immature Granulocytes: 0 %
Lymphocytes Relative: 35 %
Lymphs Abs: 1.2 10*3/uL (ref 0.7–4.0)
MCH: 25.2 pg — ABNORMAL LOW (ref 26.0–34.0)
MCHC: 31.3 g/dL (ref 30.0–36.0)
MCV: 80.7 fL (ref 80.0–100.0)
Monocytes Absolute: 0.3 10*3/uL (ref 0.1–1.0)
Monocytes Relative: 8 %
Neutro Abs: 1.6 10*3/uL — ABNORMAL LOW (ref 1.7–7.7)
Neutrophils Relative %: 47 %
Platelets: 240 10*3/uL (ref 150–400)
RBC: 5.59 MIL/uL (ref 4.22–5.81)
RDW: 14.4 % (ref 11.5–15.5)
WBC: 3.4 10*3/uL — ABNORMAL LOW (ref 4.0–10.5)
nRBC: 0 % (ref 0.0–0.2)

## 2019-03-19 LAB — COMPREHENSIVE METABOLIC PANEL
ALT: 17 U/L (ref 0–44)
AST: 19 U/L (ref 15–41)
Albumin: 4.4 g/dL (ref 3.5–5.0)
Alkaline Phosphatase: 67 U/L (ref 38–126)
Anion gap: 11 (ref 5–15)
BUN: 11 mg/dL (ref 6–20)
CO2: 28 mmol/L (ref 22–32)
Calcium: 9.6 mg/dL (ref 8.9–10.3)
Chloride: 100 mmol/L (ref 98–111)
Creatinine, Ser: 1.26 mg/dL — ABNORMAL HIGH (ref 0.61–1.24)
GFR calc Af Amer: >60 mL/min (ref >60)
GFR calc non Af Amer: >60 mL/min (ref >60)
Glucose, Bld: 88 mg/dL (ref 70–99)
Potassium: 4.1 mmol/L (ref 3.5–5.1)
Sodium: 139 mmol/L (ref 135–145)
Total Bilirubin: 1 mg/dL (ref 0.3–1.2)
Total Protein: 8.6 g/dL — ABNORMAL HIGH (ref 6.5–8.1)

## 2019-03-19 LAB — APTT: aPTT: 26 seconds (ref 24–36)

## 2019-03-19 LAB — SURGICAL PCR SCREEN
MRSA, PCR: NEGATIVE
Staphylococcus aureus: NEGATIVE

## 2019-03-19 LAB — PROTIME-INR
INR: 1.1 (ref 0.8–1.2)
Prothrombin Time: 14 seconds (ref 11.4–15.2)

## 2019-03-19 NOTE — Progress Notes (Signed)
PCP - Geronimo Running, PA-C Cardiologist - denies  Chest x-ray - 03/19/19 EKG - 03/19/19 Stress Test - denies ECHO - denies Cardiac Cath - denies  Sleep Study - h/o OSA CPAP - uses "most nights"- pressure setting 10.5   Aspirin Instructions: Patient instructed to hold all Aspirin, NSAID's, herbal medications, fish oil and vitamins 7 days prior to surgery.   ERAS Protcol - clear liquids until 0415 PRE-SURGERY Ensure or G2- Ensure by 0415  COVID TEST- 03/21/19 @ 0800 at [facility]. Pt instructed to inform security they are having surgery and need to be in the right hand lane. Aware if they arrive at large "white tent", to redirect to the surgical line. Patient educated to quarantine at home following covid test until DOS.    Anesthesia review:   Patient denies shortness of breath, fever, cough and chest pain at PAT appointment   All instructions explained to the patient, with a verbal understanding of the material. Patient agrees to go over the instructions while at home for a better understanding. Patient also instructed to self quarantine after being tested for COVID-19. The opportunity to ask questions was provided.

## 2019-03-19 NOTE — Progress Notes (Signed)
[facility] - [place], [place] - [address] [place] [address] Phone: [phone] Fax: [phone]      Your procedure is scheduled on Monday, December 7th, 2020.   Report to [facility] Main Entrance "A" at 5:30 A.M., and check in at the Admitting office.   Call this number if you have problems the morning of surgery:  [phone]  Call [phone] if you have any questions prior to your surgery date Monday-Friday 8am-4pm    Remember:  Do not eat after midnight the night before your surgery  You may drink clear liquids until 4:15AM the morning of your surgery.    Clear liquids allowed are: Water, Non-Citrus Juices (without pulp), Carbonated Beverages, Clear Tea, Black Coffee Only, and Gatorade. Your surgeon has ordered for you to have an Ensure pre-surgery carbohydrate drink. Please drink all in one sitting the morning of surgery by 4:15 AM. Patient Instructions  . The night before surgery:  o No food after midnight. ONLY clear liquids after midnight  . The day of surgery (if you do NOT have diabetes):  o Drink ONE (1) Pre-Surgery Clear Ensure as directed.   o This drink was given to you during your hospital  pre-op appointment visit. o The pre-op nurse will instruct you on the time to drink the  Pre-Surgery Ensure depending on your surgery time. o Finish the drink at the designated time by the pre-op nurse.  o Nothing else to drink after completing the  Pre-Surgery Clear Ensure.          If you have questions, please contact your surgeon's office.     Take these medicines the morning of surgery with A SIP OF WATER :  Cyclobenzaprine (Flexeril) - if needed Epinephrine injection - if needed Indomethacin (Indocin) - if needed Lubricating Plus Eye Drops - if needed Ondansetron (Zofran) - if needed Oxycodone IR/Roxicodone - if needed Venlafaxine (Effexor)  7 days prior to surgery STOP taking any Aspirin (unless otherwise instructed by  your surgeon), Aleve, Naproxen, Ibuprofen, Motrin, Advil, Goody's, BC's, all herbal medications, fish oil, and all vitamins.    The Morning of Surgery  Do not wear jewelry, make-up or nail polish.  Do not wear lotions, powders, or perfumes/colognes, or deodorant  Do not shave 48 hours prior to surgery.  Men may shave face and neck.  Do not bring valuables to the hospital.  [facility] is not responsible for any belongings or valuables.  If you are a smoker, DO NOT Smoke 24 hours prior to surgery  If you wear a CPAP at night please bring your mask, tubing, and machine the morning of surgery   Remember that you must have someone to transport you home after your surgery, and remain with you for 24 hours if you are discharged the same day.   Please bring cases for contacts, glasses, hearing aids, dentures or bridgework because it cannot be worn into surgery.    Leave your suitcase in the car.  After surgery it may be brought to your room.  For patients admitted to the hospital, discharge time will be determined by your treatment team.  Patients discharged the day of surgery will not be allowed to drive home.    Special instructions:   [facility]- Preparing For Surgery  Before surgery, you can play an important role. Because skin is not sterile, your skin needs to be as free of germs as possible. You can reduce the number of germs  on your skin by washing with CHG (chlorahexidine gluconate) Soap before surgery.  CHG is an antiseptic cleaner which kills germs and bonds with the skin to continue killing germs even after washing.    Oral Hygiene is also important to reduce your risk of infection.  Remember - BRUSH YOUR TEETH THE MORNING OF SURGERY WITH YOUR REGULAR TOOTHPASTE  Please do not use if you have an allergy to CHG or antibacterial soaps. If your skin becomes reddened/irritated stop using the CHG.  Do not shave (including legs and underarms) for at least 48 hours prior to first  CHG shower. It is OK to shave your face.  Please follow these instructions carefully.   1. Shower the NIGHT BEFORE SURGERY and the MORNING OF SURGERY with CHG Soap.   2. If you chose to wash your hair, wash your hair first as usual with your normal shampoo.  3. After you shampoo, rinse your hair and body thoroughly to remove the shampoo.  4. Use CHG as you would any other liquid soap. You can apply CHG directly to the skin and wash gently with a scrungie or a clean washcloth.   5. Apply the CHG Soap to your body ONLY FROM THE NECK DOWN.  Do not use on open wounds or open sores. Avoid contact with your eyes, ears, mouth and genitals (private parts). Wash Face and genitals (private parts)  with your normal soap.   6. Wash thoroughly, paying special attention to the area where your surgery will be performed.  7. Thoroughly rinse your body with warm water from the neck down.  8. DO NOT shower/wash with your normal soap after using and rinsing off the CHG Soap.  9. Pat yourself dry with a CLEAN TOWEL.  10. Wear CLEAN PAJAMAS to bed the night before surgery, wear comfortable clothes the morning of surgery  11. Place CLEAN SHEETS on your bed the night of your first shower and DO NOT SLEEP WITH PETS.    Day of Surgery:  Please shower the morning of surgery with the CHG soap Do not apply any deodorants/lotions. Please wear clean clothes to the hospital/surgery center.   Remember to brush your teeth WITH YOUR REGULAR TOOTHPASTE.   Please read over the following fact sheets that you were given.

## 2019-03-20 ENCOUNTER — Telehealth: Payer: Self-pay

## 2019-03-20 NOTE — Telephone Encounter (Signed)
Faxed form to [facility] to get approval for HHPT and OV Post op visits. Pending response from [place]. Patient is having SU 03/24/2019.

## 2019-03-20 NOTE — Progress Notes (Signed)
Anesthesia Chart Review:  Case: [id number] Date/Time: 03/24/19 0700   Procedure: LEFT TOTAL KNEE ARTHROPLASTY (Left Knee)   Anesthesia type: Spinal   Pre-op diagnosis: left knee degenerative joint disease   Location: [facility] OR ROOM 04 / [place] OR   Surgeon: Leandrew Koyanagi, MD      DISCUSSION: Patient is a 58 year old male scheduled for the above procedure.  History includes never smoker, OSA (inconsistent use). S/p right knee arthroscopy with major synovectomy, chondroplasty medical femoral condyle and trochlea  11/27/18.    He has had at least intermittently elevated blood pressure readings in the past, but no reported formal diagnosis of hypertension.  EKG showed normal sinus rhythm with minimal voltage criteria for LVH and no significant ST-T wave abnormalities.  He tolerated orthopedic procedure several months ago.  He denied shortness of breath, cough, fever or chest pain at PAT RN visit.  Presurgical COVID-19 test is scheduled for 03/21/19.  If negative and otherwise no acute changes and I would anticipate that he could proceed as planned.   VS: There were no vitals taken for this visit. Reportedly patient advised to met with NT prior to leaving PAT to get vitals, but this did not happen. No vitals listed at 02/28/19 and 03/11/19 visit with Dr. Erlinda Hong. The last three BP/HR reading currently available:  BP Readings from Last 3 Encounters:  12/24/18 (!) 142/100  11/27/18 138/86  10/23/18 (!) 145/98   Pulse Readings from Last 3 Encounters:  12/24/18 81  11/27/18 89  10/23/18 67    PROVIDERS: Merry Proud, PA-C is PCP  Allena Katz, MD is allergist   LABS: Labs reviewed: Acceptable for surgery. (all labs ordered are listed, but only abnormal results are displayed)  Labs Reviewed  CBC WITH DIFFERENTIAL/PLATELET - Abnormal; Notable for the following components:      Result Value   WBC 3.4 (*)    MCH 25.2 (*)    Neutro Abs 1.6 (*)    All other components within normal limits   COMPREHENSIVE METABOLIC PANEL - Abnormal; Notable for the following components:   Creatinine, Ser 1.26 (*)    Total Protein 8.6 (*)    All other components within normal limits  SURGICAL PCR SCREEN  APTT  PROTIME-INR  TYPE AND SCREEN  ABO/RH     IMAGES: CXR 03/19/19: FINDINGS: Lungs are adequately inflated and otherwise clear. Cardiomediastinal silhouette is within normal. Bones and soft tissues are unremarkable. IMPRESSION: No active cardiopulmonary disease.   EKG: 03/19/19: Normal sinus rhythm Minimal voltage criteria for LVH, may be normal variant ( R in aVL ) Borderline ECG No previous tracing Confirmed by Kirk Ruths [id number]) on 03/19/2019 9:30:04 AM   CV: N/A  Past Medical History:  Diagnosis Date  . Back pain   . Environmental allergies   . Knee problem   . Sleep apnea    rarely uses his CPAP    Past Surgical History:  Procedure Laterality Date  . APPENDECTOMY    . CHONDROPLASTY Right 11/27/2018   Procedure: CHONDROPLASTY;  Surgeon: Leandrew Koyanagi, MD;  Location: [place];  Service: Orthopedics;  Laterality: Right;  . KNEE ARTHROSCOPY Right 11/27/2018   Procedure: RIGHT KNEE ARTHROSCOPY WITH MAJOR SYNOVECTOMY AND CHONDROPLASTY MEDIAL FEMORAL CONDYLE AND TROCHLEA;  Surgeon: Leandrew Koyanagi, MD;  Location: [place];  Service: Orthopedics;  Laterality: Right;  . KNEE SURGERY Left    Multiple Knee surgeries   . SYNOVECTOMY Right 11/27/2018   Procedure: SYNOVECTOMY;  Surgeon:  Xu, Naiping M, MD;  Location: [facility];  Service: Orthopedics;  Laterality: Right;  . WISDOM TOOTH EXTRACTION      MEDICATIONS: . Bepotastine Besilate (BEPREVE) 1.5 % SOLN  . cyclobenzaprine (FLEXERIL) 10 MG tablet  . EPINEPHrine 0.3 mg/0.3 mL IJ SOAJ injection  . indomethacin (INDOCIN) 50 MG capsule  . lidocaine (LIDODERM) 5 %  . LUBRICATING PLUS EYE DROPS 0.5 % SOLN  . ondansetron (ZOFRAN ODT) 8 MG disintegrating tablet  .  oxyCODONE (OXY IR/ROXICODONE) 5 MG immediate release tablet  . QUEtiapine (SEROQUEL) 100 MG tablet  . sildenafil (VIAGRA) 100 MG tablet  . venlafaxine XR (EFFEXOR-XR) 75 MG 24 hr capsule   No current facility-administered medications for this encounter.      , PA-C Surgical Short Stay/Anesthesiology [facility] Phone [phone] [facility] Phone [phone] 03/20/2019 2:04 PM       

## 2019-03-20 NOTE — Anesthesia Preprocedure Evaluation (Addendum)
Anesthesia Evaluation  Patient identified by MRN, date of birth, ID band Patient awake    Reviewed: Allergy & Precautions, NPO status , Patient's Chart, lab work & pertinent test results  Airway Mallampati: II  TM Distance: >3 FB Neck ROM: Full    Dental no notable dental hx. (+) Dental Advisory Given, Teeth Intact   Pulmonary sleep apnea ,  Non-compliant with CPAP   Pulmonary exam normal breath sounds clear to auscultation       Cardiovascular negative cardio ROS Normal cardiovascular exam Rhythm:Regular Rate:Normal  Mildly elevated BP, no formal diagnosis of HTN   Neuro/Psych negative neurological ROS  negative psych ROS   GI/Hepatic negative GI ROS, Neg liver ROS,   Endo/Other  Obesity BMI 32  Renal/GU negative Renal ROS  negative genitourinary   Musculoskeletal  (+) Arthritis , Osteoarthritis,  DJD   Abdominal Normal abdominal exam  (+)   Peds negative pediatric ROS (+)  Hematology negative hematology ROS (+)   Anesthesia Other Findings   Reproductive/Obstetrics negative OB ROS                                                            Anesthesia Evaluation  Patient identified by MRN, date of birth, ID band Patient awake    Reviewed: Allergy & Precautions, NPO status , Patient's Chart, lab work & pertinent test results  History of Anesthesia Complications Negative for: history of anesthetic complications  Airway Mallampati: II  TM Distance: >3 FB Neck ROM: Full    Dental  (+) Teeth Intact   Pulmonary sleep apnea ,    Pulmonary exam normal        Cardiovascular negative cardio ROS Normal cardiovascular exam     Neuro/Psych negative neurological ROS  negative psych ROS   GI/Hepatic negative GI ROS, Neg liver ROS,   Endo/Other  negative endocrine ROS  Renal/GU negative Renal ROS  negative genitourinary   Musculoskeletal negative musculoskeletal  ROS (+)   Abdominal   Peds  Hematology negative hematology ROS (+)   Anesthesia Other Findings   Reproductive/Obstetrics                            Anesthesia Physical Anesthesia Plan  ASA: II  Anesthesia Plan: General   Post-op Pain Management:    Induction: Intravenous  PONV Risk Score and Plan: 2 and Ondansetron, Dexamethasone and Treatment may vary due to age or medical condition  Airway Management Planned: LMA  Additional Equipment: None  Intra-op Plan:   Post-operative Plan: Extubation in OR  Informed Consent: I have reviewed the patients History and Physical, chart, labs and discussed the procedure including the risks, benefits and alternatives for the proposed anesthesia with the patient or authorized representative who has indicated his/her understanding and acceptance.     Dental advisory given  Plan Discussed with:   Anesthesia Plan Comments:         Anesthesia Quick Evaluation                                   Anesthesia Evaluation  Patient identified by MRN, date of birth, ID band Patient awake    Reviewed: Allergy & Precautions, NPO  status , Patient's Chart, lab work & pertinent test results  History of Anesthesia Complications Negative for: history of anesthetic complications  Airway Mallampati: II  TM Distance: >3 FB Neck ROM: Full    Dental  (+) Teeth Intact   Pulmonary sleep apnea ,    Pulmonary exam normal        Cardiovascular negative cardio ROS Normal cardiovascular exam     Neuro/Psych negative neurological ROS  negative psych ROS   GI/Hepatic negative GI ROS, Neg liver ROS,   Endo/Other  negative endocrine ROS  Renal/GU negative Renal ROS  negative genitourinary   Musculoskeletal negative musculoskeletal ROS (+)   Abdominal   Peds  Hematology negative hematology ROS (+)   Anesthesia Other Findings   Reproductive/Obstetrics                             Anesthesia Physical Anesthesia Plan  ASA: II  Anesthesia Plan: General   Post-op Pain Management:    Induction: Intravenous  PONV Risk Score and Plan: 2 and Ondansetron, Dexamethasone and Treatment may vary due to age or medical condition  Airway Management Planned: LMA  Additional Equipment: None  Intra-op Plan:   Post-operative Plan: Extubation in OR  Informed Consent: I have reviewed the patients History and Physical, chart, labs and discussed the procedure including the risks, benefits and alternatives for the proposed anesthesia with the patient or authorized representative who has indicated his/her understanding and acceptance.     Dental advisory given  Plan Discussed with:   Anesthesia Plan Comments:         Anesthesia Quick Evaluation  Anesthesia Physical Anesthesia Plan  ASA: II  Anesthesia Plan: Regional and Spinal   Post-op Pain Management:  Regional for Post-op pain   Induction:   PONV Risk Score and Plan: 1 and Propofol infusion, TIVA, Treatment may vary due to age or medical condition and Midazolam  Airway Management Planned: Simple Face Mask and Natural Airway  Additional Equipment: None  Intra-op Plan:   Post-operative Plan:   Informed Consent: I have reviewed the patients History and Physical, chart, labs and discussed the procedure including the risks, benefits and alternatives for the proposed anesthesia with the patient or authorized representative who has indicated his/her understanding and acceptance.       Plan Discussed with: CRNA  Anesthesia Plan Comments:        Anesthesia Quick Evaluation

## 2019-03-21 ENCOUNTER — Other Ambulatory Visit (HOSPITAL_COMMUNITY)
Admission: RE | Admit: 2019-03-21 | Discharge: 2019-03-21 | Disposition: A | Discharge status: Home / Self Care | Payer: No Typology Code available for payment source | Source: Ambulatory Visit | Attending: Orthopaedic Surgery | Admitting: Orthopaedic Surgery

## 2019-03-21 DIAGNOSIS — Z20828 Contact with and (suspected) exposure to other viral communicable diseases: Secondary | ICD-10-CM | POA: Insufficient documentation

## 2019-03-21 DIAGNOSIS — Z01812 Encounter for preprocedural laboratory examination: Secondary | ICD-10-CM | POA: Insufficient documentation

## 2019-03-21 LAB — SARS CORONAVIRUS 2 (TAT 6-24 HRS): SARS Coronavirus 2: NEGATIVE

## 2019-03-21 MED ORDER — TRANEXAMIC ACID 1000 MG/10ML IV SOLN
2000.0000 mg | INTRAVENOUS | Status: AC
  Administered 2019-03-24: 2000 mg via TOPICAL
  Filled 2019-03-21: qty 20

## 2019-03-21 MED ORDER — DEXTROSE 5 % IV SOLN
3.0000 g | INTRAVENOUS | Status: AC
  Administered 2019-03-24: 08:00:00 3 g via INTRAVENOUS
  Filled 2019-03-21: qty 3

## 2019-03-21 MED ORDER — BUPIVACAINE LIPOSOME 1.3 % IJ SUSP
20.0000 mL | INTRAMUSCULAR | Status: AC
  Administered 2019-03-24: 20 mL
  Filled 2019-03-21: qty 20

## 2019-03-24 ENCOUNTER — Encounter (HOSPITAL_COMMUNITY): Payer: Self-pay

## 2019-03-24 ENCOUNTER — Encounter (HOSPITAL_COMMUNITY)
Admission: RE | Disposition: A | Discharge status: Home / Self Care | Payer: Self-pay | Source: Home / Self Care | Attending: Orthopaedic Surgery

## 2019-03-24 ENCOUNTER — Inpatient Hospital Stay (HOSPITAL_COMMUNITY)
Admission: RE | Admit: 2019-03-24 | Discharge: 2019-03-26 | DRG: 470 | Disposition: A | Discharge status: Home Health | Payer: No Typology Code available for payment source | Attending: Orthopaedic Surgery | Admitting: Orthopaedic Surgery

## 2019-03-24 ENCOUNTER — Observation Stay (HOSPITAL_COMMUNITY): Payer: No Typology Code available for payment source

## 2019-03-24 ENCOUNTER — Other Ambulatory Visit: Payer: Self-pay

## 2019-03-24 ENCOUNTER — Inpatient Hospital Stay (HOSPITAL_COMMUNITY): Payer: No Typology Code available for payment source | Admitting: Vascular Surgery

## 2019-03-24 ENCOUNTER — Inpatient Hospital Stay (HOSPITAL_COMMUNITY): Payer: No Typology Code available for payment source | Admitting: Anesthesiology

## 2019-03-24 DIAGNOSIS — M1712 Unilateral primary osteoarthritis, left knee: Secondary | ICD-10-CM | POA: Diagnosis not present

## 2019-03-24 DIAGNOSIS — J302 Other seasonal allergic rhinitis: Secondary | ICD-10-CM | POA: Diagnosis present

## 2019-03-24 DIAGNOSIS — Z801 Family history of malignant neoplasm of trachea, bronchus and lung: Secondary | ICD-10-CM

## 2019-03-24 DIAGNOSIS — G4730 Sleep apnea, unspecified: Secondary | ICD-10-CM | POA: Diagnosis not present

## 2019-03-24 DIAGNOSIS — D62 Acute posthemorrhagic anemia: Secondary | ICD-10-CM | POA: Diagnosis not present

## 2019-03-24 DIAGNOSIS — Z20828 Contact with and (suspected) exposure to other viral communicable diseases: Secondary | ICD-10-CM | POA: Diagnosis present

## 2019-03-24 DIAGNOSIS — M25762 Osteophyte, left knee: Secondary | ICD-10-CM | POA: Diagnosis not present

## 2019-03-24 DIAGNOSIS — Z6832 Body mass index (BMI) 32.0-32.9, adult: Secondary | ICD-10-CM | POA: Diagnosis not present

## 2019-03-24 DIAGNOSIS — E669 Obesity, unspecified: Secondary | ICD-10-CM | POA: Diagnosis present

## 2019-03-24 DIAGNOSIS — Z8249 Family history of ischemic heart disease and other diseases of the circulatory system: Secondary | ICD-10-CM

## 2019-03-24 DIAGNOSIS — Z79899 Other long term (current) drug therapy: Secondary | ICD-10-CM

## 2019-03-24 DIAGNOSIS — Z9119 Patient's noncompliance with other medical treatment and regimen: Secondary | ICD-10-CM | POA: Diagnosis not present

## 2019-03-24 DIAGNOSIS — Z96652 Presence of left artificial knee joint: Secondary | ICD-10-CM

## 2019-03-24 HISTORY — DX: Unspecified osteoarthritis, unspecified site: M19.90

## 2019-03-24 HISTORY — PX: TOTAL KNEE ARTHROPLASTY: SHX125

## 2019-03-24 SURGERY — ARTHROPLASTY, KNEE, TOTAL
Anesthesia: Regional | Site: Knee | Laterality: Left

## 2019-03-24 MED ORDER — TRANEXAMIC ACID-NACL 1000-0.7 MG/100ML-% IV SOLN
1000.0000 mg | Freq: Once | INTRAVENOUS | Status: AC
  Administered 2019-03-24: 13:00:00 1000 mg via INTRAVENOUS
  Filled 2019-03-24: qty 100

## 2019-03-24 MED ORDER — SODIUM CHLORIDE 0.9 % IR SOLN
Status: DC | PRN
  Administered 2019-03-24: 3000 mL

## 2019-03-24 MED ORDER — FENTANYL CITRATE (PF) 250 MCG/5ML IJ SOLN
INTRAMUSCULAR | Status: AC
  Filled 2019-03-24: qty 5

## 2019-03-24 MED ORDER — METOCLOPRAMIDE HCL 5 MG PO TABS: 5.0000 mg | ORAL_TABLET | Freq: Three times a day (TID) | ORAL | Status: DC | PRN

## 2019-03-24 MED ORDER — ONDANSETRON HCL 4 MG PO TABS: 4.0000 mg | ORAL_TABLET | Freq: Three times a day (TID) | ORAL | 0 refills | Status: AC | PRN

## 2019-03-24 MED ORDER — PHENOL 1.4 % MT LIQD: 1.0000 | OROMUCOSAL | Status: DC | PRN

## 2019-03-24 MED ORDER — METHOCARBAMOL 750 MG PO TABS: 750.0000 mg | ORAL_TABLET | Freq: Two times a day (BID) | ORAL | 0 refills | Status: DC | PRN

## 2019-03-24 MED ORDER — OXYCODONE HCL 5 MG PO TABS: 5.0000 mg | ORAL_TABLET | Freq: Once | ORAL | Status: DC | PRN

## 2019-03-24 MED ORDER — 0.9 % SODIUM CHLORIDE (POUR BTL) OPTIME
TOPICAL | Status: DC | PRN
  Administered 2019-03-24: 1000 mL

## 2019-03-24 MED ORDER — POLYETHYLENE GLYCOL 3350 17 G PO PACK: 17.0000 g | PACK | Freq: Every day | ORAL | Status: DC | PRN

## 2019-03-24 MED ORDER — BUPIVACAINE-EPINEPHRINE (PF) 0.5% -1:200000 IJ SOLN
INTRAMUSCULAR | Status: DC | PRN
  Administered 2019-03-24: 30 mL via PERINEURAL

## 2019-03-24 MED ORDER — DEXAMETHASONE SODIUM PHOSPHATE 10 MG/ML IJ SOLN
INTRAMUSCULAR | Status: DC | PRN
  Administered 2019-03-24: 10 mg

## 2019-03-24 MED ORDER — CHLORHEXIDINE GLUCONATE 4 % EX LIQD: 60.0000 mL | Freq: Once | CUTANEOUS | Status: DC

## 2019-03-24 MED ORDER — SODIUM CHLORIDE 0.9 % IV SOLN
INTRAVENOUS | Status: DC
  Administered 2019-03-24 (×2): via INTRAVENOUS

## 2019-03-24 MED ORDER — ASPIRIN EC 81 MG PO TBEC: 81.0000 mg | DELAYED_RELEASE_TABLET | Freq: Two times a day (BID) | ORAL | 0 refills | Status: AC

## 2019-03-24 MED ORDER — POVIDONE-IODINE 10 % EX SWAB: 2.0000 "application " | Freq: Once | CUTANEOUS | Status: DC

## 2019-03-24 MED ORDER — ACETAMINOPHEN 325 MG PO TABS: 325.0000 mg | ORAL_TABLET | Freq: Four times a day (QID) | ORAL | Status: DC | PRN

## 2019-03-24 MED ORDER — CEFAZOLIN SODIUM-DEXTROSE 2-4 GM/100ML-% IV SOLN
2.0000 g | Freq: Four times a day (QID) | INTRAVENOUS | Status: AC
  Administered 2019-03-24 – 2019-03-25 (×3): 2 g via INTRAVENOUS
  Filled 2019-03-24 (×3): qty 100

## 2019-03-24 MED ORDER — ONDANSETRON HCL 4 MG/2ML IJ SOLN
INTRAMUSCULAR | Status: DC | PRN
  Administered 2019-03-24: 4 mg via INTRAVENOUS

## 2019-03-24 MED ORDER — QUETIAPINE FUMARATE 300 MG PO TABS
300.0000 mg | ORAL_TABLET | Freq: Every day | ORAL | Status: DC
  Administered 2019-03-24 – 2019-03-25 (×2): 300 mg via ORAL
  Filled 2019-03-24 (×2): qty 1
  Filled 2019-03-24 (×2): qty 3
  Filled 2019-03-24: qty 1

## 2019-03-24 MED ORDER — MEPERIDINE HCL 25 MG/ML IJ SOLN: 6.2500 mg | INTRAMUSCULAR | Status: DC | PRN

## 2019-03-24 MED ORDER — ONDANSETRON HCL 4 MG PO TABS: 4.0000 mg | ORAL_TABLET | Freq: Four times a day (QID) | ORAL | Status: DC | PRN

## 2019-03-24 MED ORDER — CELECOXIB 200 MG PO CAPS: 200.0000 mg | ORAL_CAPSULE | Freq: Two times a day (BID) | ORAL | Status: DC

## 2019-03-24 MED ORDER — HYDROMORPHONE HCL 1 MG/ML IJ SOLN: 0.5000 mg | INTRAMUSCULAR | Status: DC | PRN

## 2019-03-24 MED ORDER — KETOROLAC TROMETHAMINE 10 MG PO TABS: 10.0000 mg | ORAL_TABLET | Freq: Two times a day (BID) | ORAL | 0 refills | Status: AC | PRN

## 2019-03-24 MED ORDER — DIPHENHYDRAMINE HCL 12.5 MG/5ML PO ELIX: 25.0000 mg | ORAL_SOLUTION | ORAL | Status: DC | PRN

## 2019-03-24 MED ORDER — TRANEXAMIC ACID-NACL 1000-0.7 MG/100ML-% IV SOLN
1000.0000 mg | INTRAVENOUS | Status: AC
  Administered 2019-03-24: 08:00:00 1000 mg via INTRAVENOUS

## 2019-03-24 MED ORDER — ROPIVACAINE HCL 5 MG/ML IJ SOLN
INTRAMUSCULAR | Status: DC | PRN
  Administered 2019-03-24: 30 mL via PERINEURAL

## 2019-03-24 MED ORDER — SODIUM CHLORIDE 0.9% FLUSH
INTRAVENOUS | Status: DC | PRN
  Administered 2019-03-24: 20 mL via INTRAVENOUS

## 2019-03-24 MED ORDER — VANCOMYCIN HCL 1000 MG IV SOLR
INTRAVENOUS | Status: DC | PRN
  Administered 2019-03-24: 1000 mg via TOPICAL

## 2019-03-24 MED ORDER — POLYVINYL ALCOHOL 1.4 % OP SOLN
1.0000 [drp] | OPHTHALMIC | Status: DC | PRN
  Filled 2019-03-24: qty 15

## 2019-03-24 MED ORDER — OXYCODONE HCL ER 10 MG PO T12A: 10.0000 mg | EXTENDED_RELEASE_TABLET | Freq: Two times a day (BID) | ORAL | 0 refills | Status: DC

## 2019-03-24 MED ORDER — MIDAZOLAM HCL 5 MG/5ML IJ SOLN
INTRAMUSCULAR | Status: DC | PRN
  Administered 2019-03-24: 2 mg via INTRAVENOUS

## 2019-03-24 MED ORDER — METOCLOPRAMIDE HCL 5 MG/ML IJ SOLN: 5.0000 mg | Freq: Three times a day (TID) | INTRAMUSCULAR | Status: DC | PRN

## 2019-03-24 MED ORDER — SORBITOL 70 % SOLN: 30.0000 mL | Freq: Every day | Status: DC | PRN

## 2019-03-24 MED ORDER — ACETAMINOPHEN 500 MG PO TABS
ORAL_TABLET | ORAL | Status: AC
  Administered 2019-03-24: 1000 mg via ORAL
  Filled 2019-03-24: qty 2

## 2019-03-24 MED ORDER — MENTHOL 3 MG MT LOZG: 1.0000 | LOZENGE | OROMUCOSAL | Status: DC | PRN

## 2019-03-24 MED ORDER — OXYCODONE HCL 5 MG PO TABS: 5.0000 mg | ORAL_TABLET | Freq: Three times a day (TID) | ORAL | 0 refills | Status: AC | PRN

## 2019-03-24 MED ORDER — MIDAZOLAM HCL 2 MG/2ML IJ SOLN
INTRAMUSCULAR | Status: AC
  Filled 2019-03-24: qty 2

## 2019-03-24 MED ORDER — ALUM & MAG HYDROXIDE-SIMETH 200-200-20 MG/5ML PO SUSP: 30.0000 mL | ORAL | Status: DC | PRN

## 2019-03-24 MED ORDER — PROPOFOL 10 MG/ML IV BOLUS
INTRAVENOUS | Status: DC | PRN
  Administered 2019-03-24: 20 mg via INTRAVENOUS

## 2019-03-24 MED ORDER — METHOCARBAMOL 500 MG PO TABS: 500.0000 mg | ORAL_TABLET | Freq: Four times a day (QID) | ORAL | Status: DC | PRN

## 2019-03-24 MED ORDER — CELECOXIB 200 MG PO CAPS
200.0000 mg | ORAL_CAPSULE | Freq: Two times a day (BID) | ORAL | Status: DC
  Administered 2019-03-25 – 2019-03-26 (×2): 200 mg via ORAL
  Filled 2019-03-24 (×2): qty 1

## 2019-03-24 MED ORDER — KETOROLAC TROMETHAMINE 15 MG/ML IJ SOLN
30.0000 mg | Freq: Four times a day (QID) | INTRAMUSCULAR | Status: AC
  Administered 2019-03-24 – 2019-03-25 (×4): 30 mg via INTRAVENOUS
  Filled 2019-03-24 (×4): qty 2

## 2019-03-24 MED ORDER — METHOCARBAMOL 1000 MG/10ML IJ SOLN
500.0000 mg | Freq: Four times a day (QID) | INTRAVENOUS | Status: DC | PRN
  Filled 2019-03-24: qty 5

## 2019-03-24 MED ORDER — GABAPENTIN 300 MG PO CAPS
300.0000 mg | ORAL_CAPSULE | Freq: Three times a day (TID) | ORAL | Status: DC
  Administered 2019-03-24 – 2019-03-26 (×8): 300 mg via ORAL
  Filled 2019-03-24 (×8): qty 1

## 2019-03-24 MED ORDER — BUPIVACAINE-EPINEPHRINE (PF) 0.5% -1:200000 IJ SOLN
INTRAMUSCULAR | Status: AC
  Filled 2019-03-24: qty 30

## 2019-03-24 MED ORDER — PROMETHAZINE HCL 25 MG/ML IJ SOLN: 6.2500 mg | INTRAMUSCULAR | Status: DC | PRN

## 2019-03-24 MED ORDER — PHENYLEPHRINE 40 MCG/ML (10ML) SYRINGE FOR IV PUSH (FOR BLOOD PRESSURE SUPPORT)
PREFILLED_SYRINGE | INTRAVENOUS | Status: AC
  Filled 2019-03-24: qty 10

## 2019-03-24 MED ORDER — OXYCODONE HCL ER 10 MG PO T12A
10.0000 mg | EXTENDED_RELEASE_TABLET | Freq: Two times a day (BID) | ORAL | Status: DC
  Administered 2019-03-24 – 2019-03-26 (×5): 10 mg via ORAL
  Filled 2019-03-24 (×5): qty 1

## 2019-03-24 MED ORDER — ASPIRIN 81 MG PO CHEW
81.0000 mg | CHEWABLE_TABLET | Freq: Two times a day (BID) | ORAL | Status: DC
  Administered 2019-03-24 – 2019-03-26 (×4): 81 mg via ORAL
  Filled 2019-03-24 (×4): qty 1

## 2019-03-24 MED ORDER — ACETAMINOPHEN 500 MG PO TABS
1000.0000 mg | ORAL_TABLET | Freq: Once | ORAL | Status: AC
  Administered 2019-03-24: 06:00:00 1000 mg via ORAL

## 2019-03-24 MED ORDER — PROPOFOL 10 MG/ML IV BOLUS
INTRAVENOUS | Status: AC
  Filled 2019-03-24: qty 20

## 2019-03-24 MED ORDER — ACETAMINOPHEN 500 MG PO TABS
1000.0000 mg | ORAL_TABLET | Freq: Four times a day (QID) | ORAL | Status: AC
  Administered 2019-03-24 – 2019-03-25 (×4): 1000 mg via ORAL
  Filled 2019-03-24 (×4): qty 2

## 2019-03-24 MED ORDER — VANCOMYCIN HCL 1000 MG IV SOLR
INTRAVENOUS | Status: AC
  Filled 2019-03-24: qty 1000

## 2019-03-24 MED ORDER — FENTANYL CITRATE (PF) 100 MCG/2ML IJ SOLN
INTRAMUSCULAR | Status: DC | PRN
  Administered 2019-03-24: 50 ug via INTRAVENOUS
  Administered 2019-03-24: 25 ug via INTRAVENOUS

## 2019-03-24 MED ORDER — OXYCODONE HCL 5 MG PO TABS
5.0000 mg | ORAL_TABLET | ORAL | Status: DC | PRN
  Filled 2019-03-24: qty 2
  Filled 2019-03-24: qty 1

## 2019-03-24 MED ORDER — OXYCODONE HCL 5 MG/5ML PO SOLN: 5.0000 mg | Freq: Once | ORAL | Status: DC | PRN

## 2019-03-24 MED ORDER — DEXAMETHASONE SODIUM PHOSPHATE 10 MG/ML IJ SOLN
10.0000 mg | Freq: Once | INTRAMUSCULAR | Status: AC
  Administered 2019-03-25: 10 mg via INTRAVENOUS
  Filled 2019-03-24: qty 1

## 2019-03-24 MED ORDER — DOCUSATE SODIUM 100 MG PO CAPS
100.0000 mg | ORAL_CAPSULE | Freq: Two times a day (BID) | ORAL | Status: DC
  Administered 2019-03-24 – 2019-03-26 (×5): 100 mg via ORAL
  Filled 2019-03-24 (×5): qty 1

## 2019-03-24 MED ORDER — PROPOFOL 500 MG/50ML IV EMUL
INTRAVENOUS | Status: DC | PRN
  Administered 2019-03-24: 25 ug/kg/min via INTRAVENOUS

## 2019-03-24 MED ORDER — KETOROLAC TROMETHAMINE 30 MG/ML IJ SOLN: 30.0000 mg | Freq: Once | INTRAMUSCULAR | Status: DC | PRN

## 2019-03-24 MED ORDER — SULFAMETHOXAZOLE-TRIMETHOPRIM 800-160 MG PO TABS: 1.0000 | ORAL_TABLET | Freq: Two times a day (BID) | ORAL | 0 refills | Status: AC

## 2019-03-24 MED ORDER — MAGNESIUM CITRATE PO SOLN: 1.0000 | Freq: Once | ORAL | Status: DC | PRN

## 2019-03-24 MED ORDER — VENLAFAXINE HCL ER 75 MG PO CP24
225.0000 mg | ORAL_CAPSULE | Freq: Every day | ORAL | Status: DC
  Administered 2019-03-25 – 2019-03-26 (×2): 225 mg via ORAL
  Filled 2019-03-24 (×3): qty 1

## 2019-03-24 MED ORDER — HYDROMORPHONE HCL 1 MG/ML IJ SOLN: 0.2500 mg | INTRAMUSCULAR | Status: DC | PRN

## 2019-03-24 MED ORDER — OXYCODONE HCL 5 MG PO TABS: 5.0000 mg | ORAL_TABLET | ORAL | Status: DC | PRN

## 2019-03-24 MED ORDER — BUPIVACAINE IN DEXTROSE 0.75-8.25 % IT SOLN
INTRATHECAL | Status: DC | PRN
  Administered 2019-03-24: 2 mL via INTRATHECAL

## 2019-03-24 MED ORDER — LACTATED RINGERS IV SOLN
INTRAVENOUS | Status: DC
  Administered 2019-03-24: 07:00:00 via INTRAVENOUS

## 2019-03-24 MED ORDER — OXYCODONE HCL 5 MG PO TABS
10.0000 mg | ORAL_TABLET | ORAL | Status: DC | PRN
  Administered 2019-03-26: 15 mg via ORAL
  Administered 2019-03-26: 10 mg via ORAL
  Filled 2019-03-24: qty 2

## 2019-03-24 MED ORDER — TRANEXAMIC ACID-NACL 1000-0.7 MG/100ML-% IV SOLN
INTRAVENOUS | Status: AC
  Filled 2019-03-24: qty 100

## 2019-03-24 MED ORDER — ONDANSETRON HCL 4 MG/2ML IJ SOLN: 4.0000 mg | Freq: Four times a day (QID) | INTRAMUSCULAR | Status: DC | PRN

## 2019-03-24 MED ORDER — CARBOXYMETHYLCELLULOSE SOD PF 0.5 % OP SOLN: 1.0000 [drp] | Freq: Three times a day (TID) | OPHTHALMIC | Status: DC | PRN

## 2019-03-24 SURGICAL SUPPLY — 90 items
ADH SKN CLS APL DERMABOND .7 (GAUZE/BANDAGES/DRESSINGS) ×1
ALCOHOL 70% 16 OZ (MISCELLANEOUS) ×3 IMPLANT
BAG DECANTER FOR FLEXI CONT (MISCELLANEOUS) ×3 IMPLANT
BANDAGE ESMARK 6X9 LF (GAUZE/BANDAGES/DRESSINGS) ×1 IMPLANT
BLADE SAG 18X100X1.27 (BLADE) ×3 IMPLANT
BNDG CMPR 9X6 STRL LF SNTH (GAUZE/BANDAGES/DRESSINGS) ×1
BNDG CMPR MED 10X6 ELC LF (GAUZE/BANDAGES/DRESSINGS) ×1
BNDG CMPR MED 15X6 ELC VLCR LF (GAUZE/BANDAGES/DRESSINGS) ×1
BNDG ELASTIC 6X10 VLCR STRL LF (GAUZE/BANDAGES/DRESSINGS) ×3 IMPLANT
BNDG ELASTIC 6X15 VLCR STRL LF (GAUZE/BANDAGES/DRESSINGS) ×2 IMPLANT
BNDG ESMARK 6X9 LF (GAUZE/BANDAGES/DRESSINGS) ×3
BOWL SMART MIX CTS (DISPOSABLE) ×3 IMPLANT
BSPLAT TIB 7 KN TRITANIUM (Knees) ×1 IMPLANT
CLOSURE STERI-STRIP 1/2X4 (GAUZE/BANDAGES/DRESSINGS) ×1
CLSR STERI-STRIP ANTIMIC 1/2X4 (GAUZE/BANDAGES/DRESSINGS) ×3 IMPLANT
COMPONENT TRI CR RETAIN SZ8 LT (Miscellaneous) IMPLANT
COVER SURGICAL LIGHT HANDLE (MISCELLANEOUS) ×3 IMPLANT
COVER WAND RF STERILE (DRAPES) ×1 IMPLANT
CUFF TOURN SGL QUICK 34 (TOURNIQUET CUFF) ×3
CUFF TOURN SGL QUICK 42 (TOURNIQUET CUFF) IMPLANT
CUFF TRNQT CYL 34X4.125X (TOURNIQUET CUFF) ×1 IMPLANT
DERMABOND ADVANCED (GAUZE/BANDAGES/DRESSINGS) ×2
DERMABOND ADVANCED .7 DNX12 (GAUZE/BANDAGES/DRESSINGS) ×1 IMPLANT
DRAPE EXTREMITY T 121X128X90 (DISPOSABLE) ×3 IMPLANT
DRAPE HALF SHEET 40X57 (DRAPES) ×3 IMPLANT
DRAPE INCISE IOBAN 66X45 STRL (DRAPES) ×4 IMPLANT
DRAPE ORTHO SPLIT 77X108 STRL (DRAPES) ×6
DRAPE POUCH INSTRU U-SHP 10X18 (DRAPES) ×3 IMPLANT
DRAPE SURG ORHT 6 SPLT 77X108 (DRAPES) ×2 IMPLANT
DRAPE U-SHAPE 47X51 STRL (DRAPES) ×6 IMPLANT
DRSG PAD ABDOMINAL 8X10 ST (GAUZE/BANDAGES/DRESSINGS) ×6 IMPLANT
DURAPREP 26ML APPLICATOR (WOUND CARE) ×9 IMPLANT
ELECT CAUTERY BLADE 6.4 (BLADE) ×3 IMPLANT
ELECT REM PT RETURN 9FT ADLT (ELECTROSURGICAL) ×3
ELECTRODE REM PT RTRN 9FT ADLT (ELECTROSURGICAL) ×1 IMPLANT
GAUZE SPONGE 4X4 12PLY STRL (GAUZE/BANDAGES/DRESSINGS) ×2 IMPLANT
GAUZE SPONGE 4X4 12PLY STRL LF (GAUZE/BANDAGES/DRESSINGS) ×3 IMPLANT
GLOVE BIO SURGEON STRL SZ 6.5 (GLOVE) ×1 IMPLANT
GLOVE BIO SURGEON STRL SZ7.5 (GLOVE) ×2 IMPLANT
GLOVE BIO SURGEONS STRL SZ 6.5 (GLOVE) ×1
GLOVE BIOGEL PI IND STRL 7.0 (GLOVE) ×1 IMPLANT
GLOVE BIOGEL PI INDICATOR 7.0 (GLOVE) ×4
GLOVE ECLIPSE 7.0 STRL STRAW (GLOVE) ×9 IMPLANT
GLOVE SKINSENSE NS SZ7.5 (GLOVE) ×6
GLOVE SKINSENSE STRL SZ7.5 (GLOVE) ×3 IMPLANT
GLOVE SURG SYN 7.5  E (GLOVE) ×8
GLOVE SURG SYN 7.5 E (GLOVE) ×4 IMPLANT
GLOVE SURG SYN 7.5 PF PI (GLOVE) ×4 IMPLANT
GOWN STRL REIN XL XLG (GOWN DISPOSABLE) ×3 IMPLANT
GOWN STRL REUS W/ TWL LRG LVL3 (GOWN DISPOSABLE) ×1 IMPLANT
GOWN STRL REUS W/TWL LRG LVL3 (GOWN DISPOSABLE) ×3
HANDPIECE INTERPULSE COAX TIP (DISPOSABLE) ×3
HOOD PEEL AWAY FLYTE STAYCOOL (MISCELLANEOUS) ×6 IMPLANT
INSERT TIB BEARING TRI X3 9 (Insert) ×2 IMPLANT
KIT BASIN OR (CUSTOM PROCEDURE TRAY) ×3 IMPLANT
KIT TURNOVER KIT B (KITS) ×3 IMPLANT
KNEE TIBIAL COMPONENT SZ7 (Knees) ×2 IMPLANT
MANIFOLD NEPTUNE II (INSTRUMENTS) ×3 IMPLANT
MARKER SKIN DUAL TIP RULER LAB (MISCELLANEOUS) ×3 IMPLANT
NDL SPNL 18GX3.5 QUINCKE PK (NEEDLE) ×2 IMPLANT
NEEDLE SPNL 18GX3.5 QUINCKE PK (NEEDLE) ×6 IMPLANT
NS IRRIG 1000ML POUR BTL (IV SOLUTION) ×3 IMPLANT
PACK TOTAL JOINT (CUSTOM PROCEDURE TRAY) ×3 IMPLANT
PAD ABD 8X10 STRL (GAUZE/BANDAGES/DRESSINGS) ×2 IMPLANT
PAD ARMBOARD 7.5X6 YLW CONV (MISCELLANEOUS) ×6 IMPLANT
PAD CAST 3X4 CTTN HI CHSV (CAST SUPPLIES) IMPLANT
PAD CAST 4YDX4 CTTN HI CHSV (CAST SUPPLIES) ×1 IMPLANT
PADDING CAST COTTON 3X4 STRL (CAST SUPPLIES) ×3
PADDING CAST COTTON 4X4 STRL (CAST SUPPLIES) ×3
PATELLA ASYMMETRIC 38X11 KNEE (Orthopedic Implant) ×2 IMPLANT
PIN FLUTED HEDLESS FIX 3.5X1/8 (PIN) ×2 IMPLANT
SAW OSC TIP CART 19.5X105X1.3 (SAW) ×3 IMPLANT
SET HNDPC FAN SPRY TIP SCT (DISPOSABLE) ×1 IMPLANT
STAPLER VISISTAT 35W (STAPLE) IMPLANT
SUCTION FRAZIER HANDLE 10FR (MISCELLANEOUS) ×2
SUCTION TUBE FRAZIER 10FR DISP (MISCELLANEOUS) ×1 IMPLANT
SUT ETHILON 2 0 FS 18 (SUTURE) IMPLANT
SUT MNCRL AB 4-0 PS2 18 (SUTURE) IMPLANT
SUT VIC AB 0 CT1 27 (SUTURE) ×6
SUT VIC AB 0 CT1 27XBRD ANBCTR (SUTURE) ×2 IMPLANT
SUT VIC AB 1 CTX 27 (SUTURE) ×11 IMPLANT
SUT VIC AB 2-0 CT1 27 (SUTURE) ×12
SUT VIC AB 2-0 CT1 TAPERPNT 27 (SUTURE) ×4 IMPLANT
SYRINGE REG LEUR 60CC (SYRINGE) ×6 IMPLANT
TOWEL GREEN STERILE (TOWEL DISPOSABLE) ×3 IMPLANT
TOWEL GREEN STERILE FF (TOWEL DISPOSABLE) ×3 IMPLANT
TRAY CATH 16FR W/PLASTIC CATH (SET/KITS/TRAYS/PACK) IMPLANT
TRIATH CRUCIAT RETAIN SZ8 KNEE (Miscellaneous) ×3 IMPLANT
UNDERPAD 30X30 (UNDERPADS AND DIAPERS) ×3 IMPLANT
WRAP KNEE MAXI GEL POST OP (GAUZE/BANDAGES/DRESSINGS) ×3 IMPLANT

## 2019-03-24 NOTE — Transfer of Care (Signed)
Immediate Anesthesia Transfer of Care Note  Patient: Craig Lopez  Procedure(s) Performed: LEFT TOTAL KNEE ARTHROPLASTY (Left Knee)  Patient Location: PACU  Anesthesia Type:MAC  Level of Consciousness: awake, alert  and sedated  Airway & Oxygen Therapy: Patient connected to nasal cannula oxygen  Post-op Assessment: Post -op Vital signs reviewed and stable  Post vital signs: stable  Last Vitals:  Vitals Value Taken Time  BP    Temp    Pulse 79 03/24/19 0949  Resp 9 03/24/19 0949  SpO2 98 % 03/24/19 0949  Vitals shown include unvalidated device data.  Last Pain:  Vitals:   03/24/19 0606  PainSc: 4          Complications: No apparent anesthesia complications

## 2019-03-24 NOTE — Evaluation (Signed)
Physical Therapy Evaluation Patient Details Name: Craig Lopez MRN: [id number] DOB: 1961-04-04 Today's Date: 03/24/2019   History of Present Illness  Craig Lopez is a 58 y.o. M s/p L TKA on 03/24/19 secondary to primary osteoarthritis of L knee. PMHx including but not limited to back pain and sleep apnea.  Clinical Impression  Pt admitted for above. Pt able to complete bed mobility, transfers, and gait min guard for safety. He was able to ambulate 75 ft without unsteadiness, LOB, or increase in pain. Gave pt Total Knee exercise sheet, and pt was able to complete quad sets, ankle pumps, and straight leg raises without increase in pain, though he does report some numbness on L anterior leg. Pt has deficits in strength, ROM, gait, balance, and overall functional mobility. Recommend outpatient PT at this time in order to address impairments upon discharge. Pt has rolling walker and single-point cane at home, and therefore just recommend 3 in 1 at this time. Pt would benefit from continued skilled acute PT in order to address deficits and improve independence with movement.     Follow Up Recommendations Outpatient PT    Equipment Recommendations  3 in 1    Recommendations for Other Services       Precautions / Restrictions Precautions Precautions: Fall Restrictions Weight Bearing Restrictions: Yes LLE Weight Bearing: Weight bearing as tolerated      Mobility  Bed Mobility Overal bed mobility: Needs Assistance Bed Mobility: Supine to Sit     Supine to sit: Min guard     General bed mobility comments: min guard for safety; did not require physical assistance  Transfers Overall transfer level: Needs assistance Equipment used: Rolling walker (2 wheeled) Transfers: Sit to/from Stand Sit to Stand: Min guard         General transfer comment: min guard for safety; did not require physical assistance; cues for hand placement on RW  Ambulation/Gait Ambulation/Gait assistance: Min  guard Gait Distance (Feet): 75 Feet Assistive device: Rolling walker (2 wheeled) Gait Pattern/deviations: Decreased stride length;Step-through pattern;Antalgic Gait velocity: decreased   General Gait Details: pt required min guard for safety; he had no significant LOB or unsteadiness throughout ambulation  Stairs            Wheelchair Mobility    Modified Rankin (Stroke Patients Only)       Balance Overall balance assessment: Needs assistance Sitting-balance support: No upper extremity supported;Feet supported Sitting balance-Leahy Scale: Good Sitting balance - Comments: maintained EOB sitting for donning/doffing of gait belt   Standing balance support: Bilateral upper extremity supported;During functional activity;Single extremity supported Standing balance-Leahy Scale: Fair Standing balance comment: took one hand off RW and did not have any LOB; likely would not tolerate challenge to standing balance                             Pertinent Vitals/Pain Pain Assessment: No/denies pain    Home Living Family/patient expects to be discharged to:: Private residence Living Arrangements: Spouse/significant other;Children Available Help at Discharge: Family Type of Home: House Home Access: Level entry     Home Layout: Two level Home Equipment: Cane - single point;Walker - 2 wheels;Shower seat - built in      Prior Function Level of Independence: Independent with assistive device(s)         Comments: SPC     Hand Dominance   Dominant Hand: Right    Extremity/Trunk Assessment  Lower Extremity Assessment Lower Extremity Assessment: LLE deficits/detail LLE Deficits / Details: s/p L TKA LLE Sensation: decreased light touch       Communication   Communication: No difficulties  Cognition Arousal/Alertness: Awake/alert Behavior During Therapy: WFL for tasks assessed/performed Overall Cognitive Status: Within Functional Limits for tasks  assessed                                 General Comments: very pleasant and motivated for PT      General Comments      Exercises Total Joint Exercises Ankle Circles/Pumps: AROM;Both;10 reps;Supine Quad Sets: AROM;Both;10 reps;Supine Straight Leg Raises: Strengthening;Both;10 reps;Supine Goniometric ROM: 7-51   Assessment/Plan    PT Assessment Patient needs continued PT services  PT Problem List Decreased strength;Decreased range of motion;Decreased activity tolerance;Decreased balance;Decreased mobility;Decreased coordination;Decreased knowledge of use of DME;Decreased safety awareness;Pain       PT Treatment Interventions DME instruction;Gait training;Stair training;Functional mobility training;Therapeutic activities;Therapeutic exercise;Balance training;Patient/family education;Modalities    PT Goals (Current goals can be found in the Care Plan section)  Acute Rehab PT Goals Patient Stated Goal: to go home PT Goal Formulation: With patient Time For Goal Achievement: 04/07/19 Potential to Achieve Goals: Good    Frequency 7X/week   Barriers to discharge        Co-evaluation               AM-PAC PT "6 Clicks" Mobility  Outcome Measure Help needed turning from your back to your side while in a flat bed without using bedrails?: None Help needed moving from lying on your back to sitting on the side of a flat bed without using bedrails?: None Help needed moving to and from a bed to a chair (including a wheelchair)?: A Little Help needed standing up from a chair using your arms (e.g., wheelchair or bedside chair)?: A Little Help needed to walk in hospital room?: A Little Help needed climbing 3-5 steps with a railing? : A Lot 6 Click Score: 19    End of Session Equipment Utilized During Treatment: Gait belt Activity Tolerance: Patient tolerated treatment well Patient left: in chair;with call bell/phone within reach Nurse Communication: Mobility  status PT Visit Diagnosis: Unsteadiness on feet (R26.81);Other abnormalities of gait and mobility (R26.89);Pain Pain - Right/Left: Left Pain - part of body: Knee    Time: 9381-8299 PT Time Calculation (min) (ACUTE ONLY): 15 min   Charges:   PT Evaluation $PT Eval Moderate Complexity: 1 Mod          Wyvonna Plum, SPT  Verdene Lennert 03/24/2019, 3:47 PM

## 2019-03-24 NOTE — Care Management (Signed)
CM consult acknowledged to assist with any HH/DME needs. Awaiting PT/OT eval for DCP recommendations and will continue to follow.  Beyounce Dickens MSN, RN, NCM-BC, ACM-RN [phone] 

## 2019-03-24 NOTE — Anesthesia Procedure Notes (Signed)
Spinal  Patient location during procedure: OR Start time: 03/24/2019 7:20 AM End time: 03/24/2019 7:27 AM Staffing Anesthesiologist: Pervis Hocking, DO Performed: anesthesiologist  Preanesthetic Checklist Completed: patient identified, surgical consent, pre-op evaluation, timeout performed, IV checked, risks and benefits discussed and monitors and equipment checked Spinal Block Patient position: sitting Prep: site prepped and draped and DuraPrep Patient monitoring: cardiac monitor, continuous pulse ox and blood pressure Approach: midline Location: L3-4 Injection technique: single-shot Needle Needle type: Pencan  Needle gauge: 24 G Needle length: 9 cm Assessment Sensory level: T6 Additional Notes Functioning IV was confirmed and monitors were applied. Sterile prep and drape, including hand hygiene and sterile gloves were used. The patient was positioned and the spine was prepped. The skin was anesthetized with lidocaine.  Free flow of clear CSF was obtained prior to injecting local anesthetic into the CSF.  The spinal needle aspirated freely following injection.  The needle was carefully withdrawn.  The patient tolerated the procedure well.

## 2019-03-24 NOTE — Progress Notes (Signed)
Orthopedic Tech Progress Note Patient Details:  Craig Lopez 03-09-1961 [id number]  CPM Left Knee CPM Left Knee: On Left Knee Flexion (Degrees): 90 Left Knee Extension (Degrees): 0 Additional Comments: Trapeze bar and foot roll     [name] 03/24/2019, 10:17 AM

## 2019-03-24 NOTE — Plan of Care (Signed)

## 2019-03-24 NOTE — Anesthesia Postprocedure Evaluation (Signed)
Anesthesia Post Note  Patient: Craig Lopez  Procedure(s) Performed: LEFT TOTAL KNEE ARTHROPLASTY (Left Knee)     Patient location during evaluation: PACU Anesthesia Type: Regional and Spinal Level of consciousness: oriented and awake and alert Pain management: pain level controlled Vital Signs Assessment: post-procedure vital signs reviewed and stable Respiratory status: spontaneous breathing and respiratory function stable Cardiovascular status: blood pressure returned to baseline and stable Postop Assessment: no headache, no backache, no apparent nausea or vomiting and patient able to bend at knees Anesthetic complications: no    Last Vitals:  Vitals:   03/24/19 1050 03/24/19 1105  BP: (!) 134/96 128/90  Pulse: 71 80  Resp: 13 13  Temp:  36.6 C  SpO2: 99% 99%    Last Pain:  Vitals:   03/24/19 1020  PainSc: 0-No pain                 Jarome Matin Lashon Beringer

## 2019-03-24 NOTE — H&P (Signed)
PREOPERATIVE H&P  Chief Complaint: left knee degenerative joint disease  HPI: Craig Lopez is a 58 y.o. male who presents for surgical treatment of left knee degenerative joint disease.  He denies any changes in medical history.  Past Medical History:  Diagnosis Date  . Back pain   . Environmental allergies   . Knee problem   . Sleep apnea    rarely uses his CPAP   Past Surgical History:  Procedure Laterality Date  . APPENDECTOMY    . CHONDROPLASTY Right 11/27/2018   Procedure: CHONDROPLASTY;  Surgeon: Leandrew Koyanagi, MD;  Location: [facility];  Service: Orthopedics;  Laterality: Right;  . KNEE ARTHROSCOPY Right 11/27/2018   Procedure: RIGHT KNEE ARTHROSCOPY WITH MAJOR SYNOVECTOMY AND CHONDROPLASTY MEDIAL FEMORAL CONDYLE AND TROCHLEA;  Surgeon: Leandrew Koyanagi, MD;  Location: [facility];  Service: Orthopedics;  Laterality: Right;  . KNEE SURGERY Left    Multiple Knee surgeries   . SYNOVECTOMY Right 11/27/2018   Procedure: SYNOVECTOMY;  Surgeon: Leandrew Koyanagi, MD;  Location: [facility];  Service: Orthopedics;  Laterality: Right;  . WISDOM TOOTH EXTRACTION     Social History   Socioeconomic History  . Marital status: Married    Spouse name: Not on file  . Number of children: Not on file  . Years of education: Not on file  . Highest education level: Not on file  Occupational History  . Not on file  Social Needs  . Financial resource strain: Not on file  . Food insecurity    Worry: Not on file    Inability: Not on file  . Transportation needs    Medical: Not on file    Non-medical: Not on file  Tobacco Use  . Smoking status: Never Smoker  . Smokeless tobacco: Never Used  Substance and Sexual Activity  . Alcohol use: Yes    Comment: socially  . Drug use: No  . Sexual activity: Never  Lifestyle  . Physical activity    Days per week: Not on file    Minutes per session: Not on file  . Stress: Not on file  Relationships   . Social Herbalist on phone: Not on file    Gets together: Not on file    Attends religious service: Not on file    Active member of club or organization: Not on file    Attends meetings of clubs or organizations: Not on file    Relationship status: Not on file  Other Topics Concern  . Not on file  Social History Narrative  . Not on file   Family History  Problem Relation Age of Onset  . Congestive Heart Failure Mother   . Lung cancer Father        He was a smoker.  . Congestive Heart Failure Sister   . Colon cancer Brother   . Congestive Heart Failure Sister    No Known Allergies Prior to Admission medications   Medication Sig Start Date End Date Taking? Authorizing Provider  Bepotastine Besilate (BEPREVE) 1.5 % SOLN Can use one drop in each eye twice daily if needed for itchy eyes. Patient taking differently: Place 1 drop into both eyes 2 (two) times daily as needed (itchy eyes. (Spring/Fall ONLY)). Can use one drop in each eye twice daily if needed for itchy eyes. 08/03/15  Yes Kozlow, Donnamarie Poag, MD  cyclobenzaprine (FLEXERIL) 10 MG tablet Take 10 mg by mouth 3 (  three) times daily as needed for muscle spasms.   Yes [provider]  EPINEPHrine 0.3 mg/0.3 mL IJ SOAJ injection Use as directed for life-threatening allergic reaction. Patient taking differently: Inject 0.3 mg into the muscle as needed for anaphylaxis. Use as directed for life-threatening allergic reaction. 08/03/15  Yes Kozlow, Alvira Philips, MD  lidocaine (LIDODERM) 5 % Place 1 patch onto the skin daily as needed (back pain.). Remove & Discard patch within 12 hours or as directed by MD   Yes [provider]  LUBRICATING PLUS EYE DROPS 0.5 % SOLN Place 1-2 drops into both eyes 3 (three) times daily as needed for dry eyes. 10/28/18  Yes [provider]  ondansetron (ZOFRAN ODT) 8 MG disintegrating tablet Take 1 tablet (8 mg total) by mouth every 8 (eight) hours as needed for nausea or vomiting.  05/30/18  Yes Molpus, John, MD  oxyCODONE (OXY IR/ROXICODONE) 5 MG immediate release tablet Take 5 mg by mouth every 4 (four) hours as needed (pain.).   Yes [provider]  QUEtiapine (SEROQUEL) 100 MG tablet Take 300 mg by mouth at bedtime.   Yes [provider]  sildenafil (VIAGRA) 100 MG tablet Take 100 mg by mouth daily as needed for erectile dysfunction. 12/30/18  Yes [provider]  indomethacin (INDOCIN) 50 MG capsule Take 1 capsule (50 mg total) by mouth 2-3 times daily as needed for mild to moderate pain. Patient not taking: Reported on 03/14/2019 10/12/18   Ward, Chase Picket, PA-C  venlafaxine XR (EFFEXOR-XR) 75 MG 24 hr capsule Take 225 mg by mouth daily. 12/26/18   [provider]     Positive ROS: All other systems have been reviewed and were otherwise negative with the exception of those mentioned in the HPI and as above.  Physical Exam: General: Alert, no acute distress Cardiovascular: No pedal edema Respiratory: No cyanosis, no use of accessory musculature GI: abdomen soft Skin: No lesions in the area of chief complaint Neurologic: Sensation intact distally Psychiatric: Patient is competent for consent with normal mood and affect Lymphatic: no lymphedema  MUSCULOSKELETAL: exam stable  Assessment: left knee degenerative joint disease  Plan: Plan for Procedure(s): LEFT TOTAL KNEE ARTHROPLASTY  The risks benefits and alternatives were discussed with the patient including but not limited to the risks of nonoperative treatment, versus surgical intervention including infection, bleeding, nerve injury,  blood clots, cardiopulmonary complications, morbidity, mortality, among others, and they were willing to proceed.   Preoperative templating of the joint replacement has been completed, documented, and submitted to the Operating Room personnel in order to optimize intra-operative equipment management.  Glee Arvin, MD   03/24/2019 7:01  AM

## 2019-03-24 NOTE — Discharge Instructions (Signed)

## 2019-03-24 NOTE — Op Note (Signed)
Total Knee Arthroplasty Procedure Note  Preoperative diagnosis: Left knee osteoarthritis  Postoperative diagnosis:same  Operative procedure: Left total knee arthroplasty. CPT [phone]  Surgeon: N. Glee Arvin, MD  Assist: Oneal Grout, PA-C; necessary for the timely completion of procedure and due to complexity of procedure.  Anesthesia: Spinal, regional  Tourniquet time: 60 mins  Implants used: [employer] Triatholon Uncemented Femur: CR 8 Tibia: 7 Patella: 38 mm Polyethylene: 9 mm, UC  Indication: Craig Lopez is a 58 y.o. year old male with a history of knee pain. Having failed conservative management, the patient elected to proceed with a total knee arthroplasty.  We have reviewed the risk and benefits of the surgery and they elected to proceed after voicing understanding.  Procedure:  After informed consent was obtained and understanding of the risk were voiced including but not limited to bleeding, infection, damage to surrounding structures including nerves and vessels, blood clots, leg length inequality and the failure to achieve desired results, the operative extremity was marked with verbal confirmation of the patient in the holding area.   The patient was then brought to the operating room and transported to the operating room table in the supine position.  A tourniquet was applied to the operative extremity around the upper thigh. The operative limb was then prepped and draped in the usual sterile fashion and preoperative antibiotics were administered.  A time out was performed prior to the start of surgery confirming the correct extremity, preoperative antibiotic administration, as well as team members, implants and instruments available for the case. Correct surgical site was also confirmed with preoperative radiographs. The limb was then elevated for exsanguination and the tourniquet was inflated. A midline incision was made and a standard medial parapatellar  approach was performed.  The patella was prepared and sized to a 38 mm.  A cover was placed on the patella for protection from retractors.  We then turned our attention to the femur. Posterior cruciate ligament was sacrificed. Start site was drilled in the femur and the intramedullary distal femoral cutting guide was placed, set at 5 degrees valgus, taking 9 mm of distal resection. The distal cut was made. Osteophytes were then removed.  Extension gap was then checked. Next, the proximal tibial cutting guide was placed with appropriate slope, varus/valgus alignment and depth of resection. The proximal tibial cut was made. Gap blocks were then used to assess the extension gap and alignment, and appropriate soft tissue releases were performed. Attention was turned back to the femur, which was sized using the sizing guide to a size 8.   Appropriate rotation of the femoral component was determined using epicondylar axis, Whiteside's line, and assessing the flexion gap under ligament tension. The block had to be shifted anteriorly 1.5 mm in order to avoid notching.  The appropriate size 4-in-1 cutting block was placed and cuts were made.  Posterior femoral osteophytes and uncapped bone were then removed with the curved osteotome.  Trial components were placed, and stability was checked in full extension, mid-flexion, and deep flexion. Proper tibial rotation was determined and marked.  The patella tracked well without a lateral release. Trial components were then removed and tibial preparation performed.  The tibia was sized for a size 7 component.  A posterior capsular injection comprising of 20 cc of 1.3% exparel, 20 cc of 0.5% bupivicaine with epi and 20 cc of normal saline was performed for postoperative pain control. The bony surfaces were irrigated with a pulse lavage and then dried.  Bone cement was vacuum mixed on the back table, and the final components sized above were cemented into place. After cement had  finished curing, excess cement was removed. The stability of the construct was re-evaluated throughout a range of motion and found to be acceptable. The trial liner was removed, the knee was copiously irrigated, and the knee was re-evaluated for any excess bone debris. The real polyethylene liner, 9 mm thick, was inserted and checked to ensure the locking mechanism had engaged appropriately. The tourniquet was deflated and hemostasis was achieved. The wound was irrigated with normal saline.  One gram of vancomycin powder was placed in the surgical bed.  Capsular closure was performed with a #1 vicryl, subcutaneous fat closed with a 2.0 vicryl suture, then subcutaneous tissue closed with interrupted 2.0 vicryl suture. The skin was then closed with a 2.0 nylon and dermabond. A sterile dressing was applied.  The patient was awakened in the operating room and taken to recovery in stable condition. All sponge, needle, and instrument counts were correct at the end of the case.  Position: supine  Complications: none.  Time Out: performed   Drains/Packing: none  Estimated blood loss: minimal  Returned to Recovery Room: in good condition.   Antibiotics: yes   Mechanical VTE (DVT) Prophylaxis: sequential compression devices, TED thigh-high  Chemical VTE (DVT) Prophylaxis: aspirin  Fluid Replacement  Crystalloid: see anesthesia record Blood: none  FFP: none   Specimens Removed: 1 to pathology   Sponge and Instrument Count Correct? yes   PACU: portable radiograph - knee AP and Lateral   Plan/RTC: Return in 2 weeks for wound check.   Weight Bearing/Load Lower Extremity: full   N. Eduard Roux, MD [facility] 9:14 AM

## 2019-03-24 NOTE — Anesthesia Procedure Notes (Signed)
Anesthesia Regional Block: Adductor canal block   Pre-Anesthetic Checklist: ,, timeout performed, Correct Patient, Correct Site, Correct Laterality, Correct Procedure, Correct Position, site marked, Risks and benefits discussed,  Surgical consent,  Pre-op evaluation,  At surgeon's request and post-op pain management  Laterality: Left  Prep: Maximum Sterile Barrier Precautions used, chloraprep       Needles:  Injection technique: Single-shot  Needle Type: Echogenic Stimulator Needle     Needle Length: 9cm  Needle Gauge: 22     Additional Needles:   Procedures:,,,, ultrasound used (permanent image in chart),,,,  Narrative:  Start time: 03/24/2019 6:50 AM End time: 03/24/2019 6:55 AM Injection made incrementally with aspirations every 5 mL.  Performed by: Personally  Anesthesiologist: Pervis Hocking, DO  Additional Notes: Monitors applied. No increased pain on injection. No increased resistance to injection. Injection made in 5cc increments. Good needle visualization. Patient tolerated procedure well.

## 2019-03-24 NOTE — Anesthesia Procedure Notes (Signed)
Procedure Name: MAC Date/Time: 03/24/2019 7:15 AM Performed by: Lavell Luster, CRNA Pre-anesthesia Checklist: Patient identified, Emergency Drugs available, Suction available, Patient being monitored and Timeout performed Patient Re-evaluated:Patient Re-evaluated prior to induction Oxygen Delivery Method: Nasal cannula Induction Type: IV induction Placement Confirmation: positive ETCO2 Dental Injury: Teeth and Oropharynx as per pre-operative assessment

## 2019-03-25 ENCOUNTER — Encounter (HOSPITAL_COMMUNITY): Payer: Self-pay | Admitting: Orthopaedic Surgery

## 2019-03-25 LAB — BASIC METABOLIC PANEL
Anion gap: 9 (ref 5–15)
BUN: 15 mg/dL (ref 6–20)
CO2: 25 mmol/L (ref 22–32)
Calcium: 9 mg/dL (ref 8.9–10.3)
Chloride: 101 mmol/L (ref 98–111)
Creatinine, Ser: 1.34 mg/dL — ABNORMAL HIGH (ref 0.61–1.24)
GFR calc Af Amer: >60 mL/min (ref >60)
GFR calc non Af Amer: 59 mL/min — ABNORMAL LOW (ref >60)
Glucose, Bld: 139 mg/dL — ABNORMAL HIGH (ref 70–99)
Potassium: 4.5 mmol/L (ref 3.5–5.1)
Sodium: 135 mmol/L (ref 135–145)

## 2019-03-25 LAB — CBC
HCT: 35.3 % — ABNORMAL LOW (ref 39.0–52.0)
Hemoglobin: 11.2 g/dL — ABNORMAL LOW (ref 13.0–17.0)
MCH: 25.2 pg — ABNORMAL LOW (ref 26.0–34.0)
MCHC: 31.7 g/dL (ref 30.0–36.0)
MCV: 79.3 fL — ABNORMAL LOW (ref 80.0–100.0)
Platelets: 221 10*3/uL (ref 150–400)
RBC: 4.45 MIL/uL (ref 4.22–5.81)
RDW: 14.5 % (ref 11.5–15.5)
WBC: 9 10*3/uL (ref 4.0–10.5)
nRBC: 0 % (ref 0.0–0.2)

## 2019-03-25 NOTE — Discharge Summary (Addendum)
Patient ID: SHRIYAN ARAKAWA MRN: [id number] DOB/AGE: 1961-01-06 58 y.o.  Admit date: 03/24/2019 Discharge date: 03/26/2019  Admission Diagnoses:  Principal Problem:   Primary osteoarthritis of left knee Active Problems:   Status post total left knee replacement   Discharge Diagnoses:  Same  Past Medical History:  Diagnosis Date  . Arthritis   . Back pain   . Environmental allergies   . Knee problem   . Sleep apnea    rarely uses his CPAP    Surgeries: Procedure(s): LEFT TOTAL KNEE ARTHROPLASTY on 03/24/2019   Consultants:   Discharged Condition: Improved  Hospital Course: COLAN LAYMON is an 58 y.o. male who was admitted 03/24/2019 for operative treatment ofPrimary osteoarthritis of left knee. Patient has severe unremitting pain that affects sleep, daily activities, and work/hobbies. After pre-op clearance the patient was taken to the operating room on 03/24/2019 and underwent  Procedure(s): LEFT TOTAL KNEE ARTHROPLASTY.    Patient was given perioperative antibiotics:  Anti-infectives (From admission, onward)   Start     Dose/Rate Route Frequency Ordered Stop   03/24/19 1400  ceFAZolin (ANCEF) IVPB 2g/100 mL premix     2 g 200 mL/hr over 30 Minutes Intravenous Every 6 hours 03/24/19 1131 03/25/19 0302   03/24/19 0851  vancomycin (VANCOCIN) powder  Status:  Discontinued       As needed 03/24/19 0852 03/24/19 0946   03/24/19 0630  ceFAZolin (ANCEF) 3 g in dextrose 5 % 50 mL IVPB     3 g 100 mL/hr over 30 Minutes Intravenous To [facility] 03/21/19 1418 03/24/19 0736   03/24/19 0000  sulfamethoxazole-trimethoprim (BACTRIM DS) 800-160 MG tablet     1 tablet Oral 2 times daily 03/24/19 0920         Patient was given sequential compression devices, early ambulation, and chemoprophylaxis to prevent DVT.  Patient benefited maximally from hospital stay and there were no complications.    Recent vital signs:  Patient Vitals for the past 24 hrs:  BP Temp Temp src Pulse  Resp SpO2  03/26/19 0742 133/89 (!) 97.5 F (36.4 C) Oral 85 16 100 %  03/26/19 0443 123/90 97.7 F (36.5 C) Oral 82 16 100 %  03/25/19 1925 (!) 148/97 99.1 F (37.3 C) Oral 94 16 100 %  03/25/19 1450 (!) 143/91 97.7 F (36.5 C) Oral 80 18 98 %     Recent laboratory studies:  Recent Labs    03/25/19 0124  WBC 9.0  HGB 11.2*  HCT 35.3*  PLT 221  NA 135  K 4.5  CL 101  CO2 25  BUN 15  CREATININE 1.34*  GLUCOSE 139*  CALCIUM 9.0     Discharge Medications:   Allergies as of 03/26/2019   No Known Allergies     Medication List    STOP taking these medications   cyclobenzaprine 10 MG tablet Commonly known as: FLEXERIL   indomethacin 50 MG capsule Commonly known as: INDOCIN     TAKE these medications   aspirin EC 81 MG tablet Take 1 tablet (81 mg total) by mouth 2 (two) times daily.   Bepotastine Besilate 1.5 % Soln Commonly known as: Bepreve Can use one drop in each eye twice daily if needed for itchy eyes. What changed:   how much to take  how to take this  when to take this  reasons to take this   EPINEPHrine 0.3 mg/0.3 mL Soaj injection Commonly known as: EPI-PEN Use as directed for life-threatening allergic  reaction. What changed:   how much to take  how to take this  when to take this  reasons to take this   ketorolac 10 MG tablet Commonly known as: TORADOL Take 1 tablet (10 mg total) by mouth 2 (two) times daily as needed.   lidocaine 5 % Commonly known as: LIDODERM Place 1 patch onto the skin daily as needed (back pain.). Remove & Discard patch within 12 hours or as directed by MD   Lubricating Plus Eye Drops 0.5 % Soln Generic drug: Carboxymethylcellulose Sod PF Place 1-2 drops into both eyes 3 (three) times daily as needed for dry eyes.   methocarbamol 750 MG tablet Commonly known as: ROBAXIN Take 1 tablet (750 mg total) by mouth 2 (two) times daily as needed for muscle spasms.   ondansetron 4 MG tablet Commonly known as:  ZOFRAN Take 1-2 tablets (4-8 mg total) by mouth every 8 (eight) hours as needed for nausea or vomiting.   ondansetron 8 MG disintegrating tablet Commonly known as: Zofran ODT Take 1 tablet (8 mg total) by mouth every 8 (eight) hours as needed for nausea or vomiting.   oxyCODONE 5 MG immediate release tablet Commonly known as: Oxy IR/ROXICODONE Take 5 mg by mouth every 4 (four) hours as needed (pain.). What changed: Another medication with the same name was added. Make sure you understand how and when to take each.   oxyCODONE 5 MG immediate release tablet Commonly known as: Oxy IR/ROXICODONE Take 1-2 tablets (5-10 mg total) by mouth every 8 (eight) hours as needed for severe pain. What changed: You were already taking a medication with the same name, and this prescription was added. Make sure you understand how and when to take each.   oxyCODONE 10 mg 12 hr tablet Commonly known as: OXYCONTIN Take 1 tablet (10 mg total) by mouth every 12 (twelve) hours for 3 days. What changed: You were already taking a medication with the same name, and this prescription was added. Make sure you understand how and when to take each.   QUEtiapine 100 MG tablet Commonly known as: SEROQUEL Take 300 mg by mouth at bedtime.   sildenafil 100 MG tablet Commonly known as: VIAGRA Take 100 mg by mouth daily as needed for erectile dysfunction.   sulfamethoxazole-trimethoprim 800-160 MG tablet Commonly known as: BACTRIM DS Take 1 tablet by mouth 2 (two) times daily.   venlafaxine XR 75 MG 24 hr capsule Commonly known as: EFFEXOR-XR Take 225 mg by mouth daily.            Durable Medical Equipment  (From admission, onward)         Start     Ordered   03/24/19 1124  DME Walker rolling  Once    Question:  Patient needs a walker to treat with the following condition  Answer:  Total knee replacement status   03/24/19 1131   03/24/19 1124  DME 3 n 1  Once     03/24/19 1131   03/24/19 1124  DME  Bedside commode  Once    Question:  Patient needs a bedside commode to treat with the following condition  Answer:  Total knee replacement status   03/24/19 1131          Diagnostic Studies: Dg Chest 2 View  Result Date: 03/19/2019 CLINICAL DATA:  Preop for left total knee arthroplasty. EXAM: CHEST - 2 VIEW COMPARISON:  09/02/2012 FINDINGS: Lungs are adequately inflated and otherwise clear. Cardiomediastinal silhouette is within normal.  Bones and soft tissues are unremarkable. IMPRESSION: No active cardiopulmonary disease. Electronically Signed   By: Elberta Fortisaniel  Boyle M.D.   On: 03/19/2019 12:30   Dg Knee Left Port  Result Date: 03/24/2019 CLINICAL DATA:  Total knee replacement EXAM: PORTABLE LEFT KNEE - 1-2 VIEW COMPARISON:  02/28/2019 FINDINGS: Left total knee replacement in satisfactory position alignment. Gas and fluid in the joint and soft tissues. No fracture or immediate complication IMPRESSION: Satisfactory left knee replacement. Electronically Signed   By: Marlan Palauharles  Clark M.D.   On: 03/24/2019 10:23   Xr Knee 3 View Left  Result Date: 02/28/2019 Moderate joint space narrowing and DJD.   Disposition: Discharge disposition: 01-Home or Self Care         Follow-up Information    Tarry KosXu, Naiping M, MD In 2 weeks.   Specialty: Orthopedic Surgery Why: For suture removal, For wound re-check Contact information: [address] [place] [address] [phone]        Home, [facility] Follow up.   Specialty: Home Health Services Why: Home Health Physical Therapy Contact information: [address] [place] [address] [phone]            Signed: Cristie HemMary L  03/26/2019, 8:17 AM

## 2019-03-25 NOTE — Plan of Care (Signed)

## 2019-03-25 NOTE — Progress Notes (Signed)
Subjective: 1 Day Post-Op Procedure(s) (LRB): LEFT TOTAL KNEE ARTHROPLASTY (Left) Patient reports pain as mild.    Objective: Vital signs in last 24 hours: Temp:  [97.5 F (36.4 C)-98.6 F (37 C)] 98.6 F (37 C) (12/08 0316) Pulse Rate:  [71-89] 78 (12/08 0316) Resp:  [12-17] 17 (12/08 0316) BP: (117-143)/(79-98) 122/84 (12/08 0316) SpO2:  [95 %-100 %] 95 % (12/08 0316)  Intake/Output from previous day: 12/07 0701 - 12/08 0700 In: 9735 [P.O.:240; I.V.:1177; IV Piggyback:200] Out: 3300 [Urine:3100; Blood:200] Intake/Output this shift: No intake/output data recorded.  Recent Labs    03/25/19 0124  HGB 11.2*   Recent Labs    03/25/19 0124  WBC 9.0  RBC 4.45  HCT 35.3*  PLT 221   Recent Labs    03/25/19 0124  NA 135  K 4.5  CL 101  CO2 25  BUN 15  CREATININE 1.34*  GLUCOSE 139*  CALCIUM 9.0   No results for input(s): LABPT, INR in the last 72 hours.  Neurologically intact Neurovascular intact Sensation intact distally Intact pulses distally Dorsiflexion/Plantar flexion intact Incision: dressing C/D/I No cellulitis present Compartment soft   Assessment/Plan: 1 Day Post-Op Procedure(s) (LRB): LEFT TOTAL KNEE ARTHROPLASTY (Left) Advance diet Up with therapy D/C IV fluids Discharge home with home health after second PT session as long as patient continues to progress WBAT LLE ABLA-mild and stable Dressing changed by me today Please apply thigh high ted hose to LLE D/c foley   Anticipated LOS equal to or greater than 2 midnights due to - Age 56 and older with one or more of the following:  - Obesity  - Expected need for hospital services (PT, OT, Nursing) required for safe  discharge  - Anticipated need for postoperative skilled nursing care or inpatient rehab  - Active co-morbidities: None OR   - Unanticipated findings during/Post Surgery: None  - Patient is a high risk of re-admission due to: None    Aundra Dubin 03/25/2019, 7:47 AM

## 2019-03-25 NOTE — Progress Notes (Signed)
Physical Therapy Treatment Patient Details Name: Craig Lopez MRN: [id number] DOB: Jul 20, 1960 Today's Date: 03/25/2019    History of Present Illness Craig Lopez is a 57 y.o. M s/p L TKA on 03/24/19 secondary to primary osteoarthritis of L knee. PMHx including but not limited to back pain and sleep apnea.    PT Comments    Pt admitted for above diagnosis. He demonstrated improvement in his functional mobility today. He was able to complete bed mobility with supervision, and was min guard for transfers, gait, and stairs for safety. Demonstrated/explained remaining Total Joint exercises. He notes some pain with heel slides, but tolerated exercises well, otherwise. He was able to ambulate 250 ft with minimal pain increase, but noted some fatigue towards the end of walk. He navigated 2 steps backwards with walker. He required some cueing for sequencing and RW placement with steps, but demonstrated good understanding and states that he feels comfortable navigating stairs with this method if he needs to do so. His L knee ROM was 3-70 today. Pt reports he prefers outpatient PT over [facility] PT, but see that [facility] PT was prearranged with his Ortho office per CM's note. HH PT is appropriate. Pt would benefit from continued skilled PT in order to address deficits in ROM, strength, balance, and gait.  Follow Up Recommendations  Outpatient PT     Equipment Recommendations  3in1    Recommendations for Other Services       Precautions / Restrictions Precautions Precautions: Fall Restrictions Weight Bearing Restrictions: Yes LLE Weight Bearing: Weight bearing as tolerated    Mobility  Bed Mobility Overal bed mobility: Needs Assistance Bed Mobility: Supine to Sit     Supine to sit: Supervision     General bed mobility comments: supervision for safety; no physical assistance required  Transfers Overall transfer level: Needs assistance Equipment used: Rolling walker (2 wheeled) Transfers: Sit to/from  Stand Sit to Stand: Min guard         General transfer comment: min guard for safety; did not require physical assistance; minimal increased time and effort  Ambulation/Gait Ambulation/Gait assistance: Min guard Gait Distance (Feet): 250 Feet Assistive device: Rolling walker (2 wheeled) Gait Pattern/deviations: Decreased stride length;Step-through pattern;Antalgic Gait velocity: decreased   General Gait Details: pt required min guard for safety; he had no significant LOB or unsteadiness throughout ambulation   Stairs Stairs: Yes Stairs assistance: Min guard Stair Management: No rails;Backwards;With walker Number of Stairs: 2 General stair comments: pt used good technique and required some cueing for walker placement following demonstration. pt states he feels comfortable navigating stairs if necessary using this method   Wheelchair Mobility    Modified Rankin (Stroke Patients Only)       Balance Overall balance assessment: Needs assistance Sitting-balance support: No upper extremity supported;Feet supported Sitting balance-Leahy Scale: Good Sitting balance - Comments: maintained EOB sitting for donning/doffing of gait belt   Standing balance support: No upper extremity supported;During functional activity;Bilateral upper extremity supported Standing balance-Leahy Scale: Good Standing balance comment: pt took both hands off the RW to put on his mask and had no unsteadiness or LOB                            Cognition Arousal/Alertness: Awake/alert Behavior During Therapy: WFL for tasks assessed/performed Overall Cognitive Status: Within Functional Limits for tasks assessed  General Comments: very pleasant and motivated for PT      Exercises Total Joint Exercises Ankle Circles/Pumps: AROM;Both;10 reps;Supine Heel Slides: AAROM;10 reps;Supine;Left Long Arc Quad: Left;10 reps;Supine;AROM Goniometric ROM:  3-70    General Comments        Pertinent Vitals/Pain Pain Assessment: Faces Faces Pain Scale: Hurts a little bit Pain Location: L knee Pain Descriptors / Indicators: Aching;Constant;Dull;Grimacing;Sore Pain Intervention(s): Limited activity within patient's tolerance;Monitored during session;Repositioned    Home Living                      Prior Function            PT Goals (current goals can now be found in the care plan section) Acute Rehab PT Goals Patient Stated Goal: to go home PT Goal Formulation: With patient Time For Goal Achievement: 04/07/19 Potential to Achieve Goals: Good Progress towards PT goals: Progressing toward goals    Frequency    7X/week      PT Plan Current plan remains appropriate    Co-evaluation              AM-PAC PT "6 Clicks" Mobility   Outcome Measure  Help needed turning from your back to your side while in a flat bed without using bedrails?: None Help needed moving from lying on your back to sitting on the side of a flat bed without using bedrails?: None Help needed moving to and from a bed to a chair (including a wheelchair)?: None Help needed standing up from a chair using your arms (e.g., wheelchair or bedside chair)?: A Little Help needed to walk in hospital room?: A Little Help needed climbing 3-5 steps with a railing? : A Little 6 Click Score: 21    End of Session Equipment Utilized During Treatment: Gait belt Activity Tolerance: Patient tolerated treatment well Patient left: in chair;with call bell/phone within reach Nurse Communication: Mobility status PT Visit Diagnosis: Unsteadiness on feet (R26.81);Other abnormalities of gait and mobility (R26.89);Pain Pain - Right/Left: Left Pain - part of body: Knee     Time: 7654-6503 PT Time Calculation (min) (ACUTE ONLY): 32 min  Charges:  $Gait Training: 8-22 mins $Therapeutic Exercise: 8-22 mins                     Wyvonna Plum, SPT   [place]  Sharon Stapel 03/25/2019, 10:24 AM

## 2019-03-25 NOTE — TOC Transition Note (Addendum)
Transition of Care [facility]) - CM/SW Discharge Note   Patient Details  Name: Craig Lopez MRN: [id number] Date of Birth: 15-Dec-1960  Transition of Care [facility]) CM/SW Contact:  Midge Minium MSN, RN, NCM-BC, ACM-RN [phone] Phone Number: 03/25/2019, 9:26 AM   Clinical Narrative:    CM following for dispositional needs. CM spoke to the patient to discuss the POC. Patient is a 58 y.o. M s/p L TKA on 03/24/19 secondary to primary osteoarthritis of L knee. Patient was independent with assistive devices PTA. PCP/Demo verified. PT eval completed; HHPT was prearranged with [facility] by the patients Ortho office PTA. CMS HH compare provided with patient requesting to continue with [facility]. Patient would like his 3n1 arranged with the [place]; verbal permission provided by the patient, for the CM to fax the DME referral and discharge information to the [facility]; information faxed to [phone] as requested. No further needs from CM.   Final next level of care: [place] Barriers to Discharge: No Barriers Identified   Patient Goals and CMS Choice Patient states their goals for this hospitalization and ongoing recovery are:: "to go home" CMS Medicare.gov Compare Post Acute Care list provided to:: Patient Choice offered to / list presented to : Patient   Discharge Plan and Services              DME Arranged: N/A DME Agency: NA       HH Arranged: PT [place] Agency: [facility] [employer] Date [place]: 03/25/19 Time [place]: [phone] Representative spoke with at [place]: Shannon (liaison)  Social Determinants of Health ([place]) Interventions     Readmission Risk Interventions No flowsheet data found.

## 2019-03-25 NOTE — Plan of Care (Signed)

## 2019-03-26 ENCOUNTER — Telehealth: Payer: Self-pay | Admitting: Orthopaedic Surgery

## 2019-03-26 NOTE — Telephone Encounter (Signed)
Craig Lopez states she already faxed this.

## 2019-03-26 NOTE — Telephone Encounter (Signed)
Patient called stating that he is suppose to be discharged from the hospital today, but they will not let him leave without the machine to bend his knee.  He stated that the [place] needs an authorization faxed to them at [phone].  CB#[phone].  Thank you.

## 2019-03-26 NOTE — Progress Notes (Signed)
Subjective: 2 Days Post-Op Procedure(s) (LRB): LEFT TOTAL KNEE ARTHROPLASTY (Left) Patient reports pain as mild.    Objective: Vital signs in last 24 hours: Temp:  [97.5 F (36.4 C)-99.1 F (37.3 C)] 97.5 F (36.4 C) (12/09 0742) Pulse Rate:  [80-94] 85 (12/09 0742) Resp:  [16-18] 16 (12/09 0742) BP: (123-148)/(89-97) 133/89 (12/09 0742) SpO2:  [98 %-100 %] 100 % (12/09 0742)  Intake/Output from previous day: 12/08 0701 - 12/09 0700 In: 720 [P.O.:720] Out: 900 [Urine:900] Intake/Output this shift: No intake/output data recorded.  Recent Labs    03/25/19 0124  HGB 11.2*   Recent Labs    03/25/19 0124  WBC 9.0  RBC 4.45  HCT 35.3*  PLT 221   Recent Labs    03/25/19 0124  NA 135  K 4.5  CL 101  CO2 25  BUN 15  CREATININE 1.34*  GLUCOSE 139*  CALCIUM 9.0   No results for input(s): LABPT, INR in the last 72 hours.  Neurologically intact Neurovascular intact Sensation intact distally Intact pulses distally Dorsiflexion/Plantar flexion intact Incision: dressing C/D/I No cellulitis present Compartment soft   Assessment/Plan: 2 Days Post-Op Procedure(s) (LRB): LEFT TOTAL KNEE ARTHROPLASTY (Left) Up with therapy Discharge home with home health after first or second PT session (up to patient) WBAT LLE   Anticipated LOS equal to or greater than 2 midnights due to - Age 29 and older with one or more of the following:  - Obesity  - Expected need for hospital services (PT, OT, Nursing) required for safe  discharge  - Anticipated need for postoperative skilled nursing care or inpatient rehab  - Active co-morbidities: None OR   - Unanticipated findings during/Post Surgery: None  - Patient is a high risk of re-admission due to: None    Aundra Dubin 03/26/2019, 8:16 AM

## 2019-03-26 NOTE — Plan of Care (Signed)

## 2019-03-26 NOTE — Progress Notes (Signed)
Physical Therapy Treatment Patient Details Name: EYTAN CARRIGAN MRN: [id number] DOB: Jul 21, 1960 Today's Date: 03/26/2019    History of Present Illness Icarus Partch is a 58 y.o. M s/p L TKA on 03/24/19 secondary to primary osteoarthritis of L knee. PMHx including but not limited to back pain and sleep apnea.    PT Comments    Pt admitted for above diagnosis. Pt reports increased pain today, but states that he has not had his pain meds since last night. RN notified for pt request for meds, but pt declined waiting for meds to begin PT session. Assessed L knee ROM in supine (3-75). He is able to complete bed mobility and transfers with supervision. He states that he was using his cane to get to and from the bathroom, but SPT recommended he continue to use the walker due to antalgic gait pattern. His gait speed continues to be decreased, but pt was able to ambulate 400 ft with min guard for safety. He had one episode of knee buckling, but was able to right himself very quickly without physical assistance. Upon sitting in the chair, pt was encouraged to continue to work on knee flexion exercises and completed 5 AAROM seated heel slides using his R LE to assist. Pt reported that his pain increased to a 7/10, so discontinued exercise and placed L LE in bone foam. D/C plan updated to reflect that pt had already set up [facility] PT prior to admission (rather than outpatient). Equipment recommendation unchanged as pt has walker at home. Do not feel pt needs to be seen BID due to current mobility status and pt reporting that he feels comfortable with navigating his home. Pt would benefit from continued skilled PT intervention to address deficits.   Follow Up Recommendations  Home health PT     Equipment Recommendations  None recommended by PT    Recommendations for Other Services       Precautions / Restrictions Precautions Precautions: Fall Restrictions Weight Bearing Restrictions: Yes LLE Weight Bearing:  Weight bearing as tolerated    Mobility  Bed Mobility Overal bed mobility: Needs Assistance Bed Mobility: Supine to Sit     Supine to sit: Supervision     General bed mobility comments: supervision for safety; no physical assistance required  Transfers Overall transfer level: Needs assistance Equipment used: Rolling walker (2 wheeled) Transfers: Sit to/from Stand Sit to Stand: Supervision         General transfer comment: supervision for safety; pt did not require physical assistance and had good hand placement during transfer  Ambulation/Gait Ambulation/Gait assistance: Min guard Gait Distance (Feet): 400 Feet Assistive device: Rolling walker (2 wheeled) Gait Pattern/deviations: Decreased stride length;Step-through pattern;Antalgic Gait velocity: decreased   General Gait Details: pt required min guard for safety; antalgic gait more pronounced today and pt had one episode of knee buckling but did not require assist to right himself; he states that his knee pain is a 6/10 throughout ambulation   Stairs             Wheelchair Mobility    Modified Rankin (Stroke Patients Only)       Balance Overall balance assessment: Needs assistance Sitting-balance support: No upper extremity supported;Feet supported Sitting balance-Leahy Scale: Good Sitting balance - Comments: maintained EOB sitting for donning/doffing of gait belt   Standing balance support: No upper extremity supported;During functional activity;Bilateral upper extremity supported Standing balance-Leahy Scale: Good Standing balance comment: pt took both hands off the RW to put on his mask  and had no unsteadiness or LOB                            Cognition Arousal/Alertness: Awake/alert Behavior During Therapy: WFL for tasks assessed/performed Overall Cognitive Status: Within Functional Limits for tasks assessed                                 General Comments: very pleasant  and motivated for PT      Exercises Total Joint Exercises Heel Slides: AAROM;Left;5 reps;Seated Goniometric ROM: 3-75    General Comments        Pertinent Vitals/Pain Pain Assessment: 0-10 Pain Score: 6  Pain Location: L knee, L quads Pain Descriptors / Indicators: Aching;Constant;Dull;Grimacing;Sore Pain Intervention(s): Limited activity within patient's tolerance;Monitored during session;Patient requesting pain meds-RN notified;Repositioned    Home Living                      Prior Function            PT Goals (current goals can now be found in the care plan section) Acute Rehab PT Goals Patient Stated Goal: to go home PT Goal Formulation: With patient Time For Goal Achievement: 04/07/19 Potential to Achieve Goals: Good Progress towards PT goals: Progressing toward goals    Frequency    7X/week      PT Plan Discharge plan needs to be updated    Co-evaluation              AM-PAC PT "6 Clicks" Mobility   Outcome Measure  Help needed turning from your back to your side while in a flat bed without using bedrails?: None Help needed moving from lying on your back to sitting on the side of a flat bed without using bedrails?: None Help needed moving to and from a bed to a chair (including a wheelchair)?: None Help needed standing up from a chair using your arms (e.g., wheelchair or bedside chair)?: A Little Help needed to walk in hospital room?: A Little Help needed climbing 3-5 steps with a railing? : A Little 6 Click Score: 21    End of Session Equipment Utilized During Treatment: Gait belt Activity Tolerance: Patient tolerated treatment well;Patient limited by pain Patient left: in chair;with call bell/phone within reach Nurse Communication: Mobility status PT Visit Diagnosis: Unsteadiness on feet (R26.81);Other abnormalities of gait and mobility (R26.89);Pain Pain - Right/Left: Left Pain - part of body: Knee     Time: 6789-3810 PT Time  Calculation (min) (ACUTE ONLY): 20 min  Charges:  $Gait Training: 8-22 mins                     Wyvonna Plum, SPT    Rebecka Oelkers 03/26/2019, 9:27 AM

## 2019-03-26 NOTE — Plan of Care (Signed)

## 2019-03-27 ENCOUNTER — Other Ambulatory Visit: Payer: Self-pay

## 2019-03-27 ENCOUNTER — Encounter (HOSPITAL_COMMUNITY): Payer: Self-pay | Admitting: Emergency Medicine

## 2019-03-27 ENCOUNTER — Emergency Department (HOSPITAL_COMMUNITY): Payer: No Typology Code available for payment source

## 2019-03-27 ENCOUNTER — Observation Stay (HOSPITAL_COMMUNITY)
Admission: EM | Admit: 2019-03-27 | Discharge: 2019-03-29 | Disposition: A | Discharge status: Home / Self Care | Payer: No Typology Code available for payment source | Attending: Student in an Organized Health Care Education/Training Program | Admitting: Student in an Organized Health Care Education/Training Program

## 2019-03-27 DIAGNOSIS — M25562 Pain in left knee: Secondary | ICD-10-CM | POA: Diagnosis not present

## 2019-03-27 DIAGNOSIS — W1839XA Other fall on same level, initial encounter: Secondary | ICD-10-CM | POA: Insufficient documentation

## 2019-03-27 DIAGNOSIS — Z792 Long term (current) use of antibiotics: Secondary | ICD-10-CM | POA: Diagnosis not present

## 2019-03-27 DIAGNOSIS — I959 Hypotension, unspecified: Secondary | ICD-10-CM | POA: Insufficient documentation

## 2019-03-27 DIAGNOSIS — Z79899 Other long term (current) drug therapy: Secondary | ICD-10-CM | POA: Diagnosis not present

## 2019-03-27 DIAGNOSIS — Z96652 Presence of left artificial knee joint: Secondary | ICD-10-CM | POA: Insufficient documentation

## 2019-03-27 DIAGNOSIS — I951 Orthostatic hypotension: Secondary | ICD-10-CM

## 2019-03-27 DIAGNOSIS — Z20828 Contact with and (suspected) exposure to other viral communicable diseases: Secondary | ICD-10-CM | POA: Insufficient documentation

## 2019-03-27 DIAGNOSIS — F4310 Post-traumatic stress disorder, unspecified: Secondary | ICD-10-CM | POA: Diagnosis not present

## 2019-03-27 DIAGNOSIS — G4733 Obstructive sleep apnea (adult) (pediatric): Secondary | ICD-10-CM | POA: Diagnosis not present

## 2019-03-27 DIAGNOSIS — M1990 Unspecified osteoarthritis, unspecified site: Secondary | ICD-10-CM

## 2019-03-27 DIAGNOSIS — Z79891 Long term (current) use of opiate analgesic: Secondary | ICD-10-CM | POA: Insufficient documentation

## 2019-03-27 DIAGNOSIS — D649 Anemia, unspecified: Secondary | ICD-10-CM | POA: Insufficient documentation

## 2019-03-27 DIAGNOSIS — M1712 Unilateral primary osteoarthritis, left knee: Secondary | ICD-10-CM | POA: Insufficient documentation

## 2019-03-27 DIAGNOSIS — R195 Other fecal abnormalities: Secondary | ICD-10-CM | POA: Insufficient documentation

## 2019-03-27 DIAGNOSIS — R55 Syncope and collapse: Principal | ICD-10-CM | POA: Insufficient documentation

## 2019-03-27 DIAGNOSIS — Z96651 Presence of right artificial knee joint: Secondary | ICD-10-CM

## 2019-03-27 DIAGNOSIS — Z7982 Long term (current) use of aspirin: Secondary | ICD-10-CM | POA: Diagnosis not present

## 2019-03-27 DIAGNOSIS — W19XXXA Unspecified fall, initial encounter: Secondary | ICD-10-CM

## 2019-03-27 LAB — BASIC METABOLIC PANEL
Anion gap: 7 (ref 5–15)
BUN: 24 mg/dL — ABNORMAL HIGH (ref 6–20)
CO2: 29 mmol/L (ref 22–32)
Calcium: 8.6 mg/dL — ABNORMAL LOW (ref 8.9–10.3)
Chloride: 103 mmol/L (ref 98–111)
Creatinine, Ser: 1.13 mg/dL (ref 0.61–1.24)
GFR calc Af Amer: >60 mL/min (ref >60)
GFR calc non Af Amer: >60 mL/min (ref >60)
Glucose, Bld: 112 mg/dL — ABNORMAL HIGH (ref 70–99)
Potassium: 3.6 mmol/L (ref 3.5–5.1)
Sodium: 139 mmol/L (ref 135–145)

## 2019-03-27 LAB — HEPATIC FUNCTION PANEL
ALT: 23 U/L (ref 0–44)
AST: 19 U/L (ref 15–41)
Albumin: 3.1 g/dL — ABNORMAL LOW (ref 3.5–5.0)
Alkaline Phosphatase: 47 U/L (ref 38–126)
Bilirubin, Direct: 0.1 mg/dL (ref 0.0–0.2)
Indirect Bilirubin: 0.8 mg/dL (ref 0.3–0.9)
Total Bilirubin: 0.9 mg/dL (ref 0.3–1.2)
Total Protein: 6.1 g/dL — ABNORMAL LOW (ref 6.5–8.1)

## 2019-03-27 LAB — CBC
HCT: 23.4 % — ABNORMAL LOW (ref 39.0–52.0)
Hemoglobin: 7.3 g/dL — ABNORMAL LOW (ref 13.0–17.0)
MCH: 25.3 pg — ABNORMAL LOW (ref 26.0–34.0)
MCHC: 31.2 g/dL (ref 30.0–36.0)
MCV: 81.3 fL (ref 80.0–100.0)
Platelets: 148 10*3/uL — ABNORMAL LOW (ref 150–400)
RBC: 2.88 MIL/uL — ABNORMAL LOW (ref 4.22–5.81)
RDW: 14.6 % (ref 11.5–15.5)
WBC: 5.9 10*3/uL (ref 4.0–10.5)
nRBC: 0 % (ref 0.0–0.2)

## 2019-03-27 LAB — CBC WITH DIFFERENTIAL/PLATELET
Abs Immature Granulocytes: 0.02 10*3/uL (ref 0.00–0.07)
Basophils Absolute: 0 10*3/uL (ref 0.0–0.1)
Basophils Relative: 0 %
Eosinophils Absolute: 0.2 10*3/uL (ref 0.0–0.5)
Eosinophils Relative: 3 %
HCT: 26.2 % — ABNORMAL LOW (ref 39.0–52.0)
Hemoglobin: 8.2 g/dL — ABNORMAL LOW (ref 13.0–17.0)
Immature Granulocytes: 0 %
Lymphocytes Relative: 30 %
Lymphs Abs: 2.1 10*3/uL (ref 0.7–4.0)
MCH: 25.1 pg — ABNORMAL LOW (ref 26.0–34.0)
MCHC: 31.3 g/dL (ref 30.0–36.0)
MCV: 80.1 fL (ref 80.0–100.0)
Monocytes Absolute: 0.7 10*3/uL (ref 0.1–1.0)
Monocytes Relative: 10 %
Neutro Abs: 4 10*3/uL (ref 1.7–7.7)
Neutrophils Relative %: 57 %
Platelets: 195 10*3/uL (ref 150–400)
RBC: 3.27 MIL/uL — ABNORMAL LOW (ref 4.22–5.81)
RDW: 14.5 % (ref 11.5–15.5)
WBC: 7 10*3/uL (ref 4.0–10.5)
nRBC: 0 % (ref 0.0–0.2)

## 2019-03-27 LAB — URINALYSIS, ROUTINE W REFLEX MICROSCOPIC
Bilirubin Urine: NEGATIVE
Glucose, UA: NEGATIVE mg/dL
Hgb urine dipstick: NEGATIVE
Ketones, ur: NEGATIVE mg/dL
Leukocytes,Ua: NEGATIVE
Nitrite: NEGATIVE
Protein, ur: NEGATIVE mg/dL
Specific Gravity, Urine: 1.015 (ref 1.005–1.030)
pH: 6 (ref 5.0–8.0)

## 2019-03-27 LAB — IRON AND TIBC
Iron: 21 ug/dL — ABNORMAL LOW (ref 45–182)
Saturation Ratios: 8 % — ABNORMAL LOW (ref 17.9–39.5)
TIBC: 255 ug/dL (ref 250–450)
UIBC: 234 ug/dL

## 2019-03-27 LAB — TYPE AND SCREEN
ABO/RH(D): B POS
Antibody Screen: NEGATIVE

## 2019-03-27 LAB — RETICULOCYTES
Immature Retic Fract: 20.2 % — ABNORMAL HIGH (ref 2.3–15.9)
RBC.: 2.86 MIL/uL — ABNORMAL LOW (ref 4.22–5.81)
Retic Count, Absolute: 36 10*3/uL (ref 19.0–186.0)
Retic Ct Pct: 1.3 % (ref 0.4–3.1)

## 2019-03-27 LAB — HIV ANTIBODY (ROUTINE TESTING W REFLEX): HIV Screen 4th Generation wRfx: NONREACTIVE

## 2019-03-27 LAB — APTT: aPTT: 29 seconds (ref 24–36)

## 2019-03-27 LAB — SARS CORONAVIRUS 2 (TAT 6-24 HRS): SARS Coronavirus 2: NEGATIVE

## 2019-03-27 LAB — HEMOGLOBIN AND HEMATOCRIT, BLOOD
HCT: 26 % — ABNORMAL LOW (ref 39.0–52.0)
HCT: 23.5 % — ABNORMAL LOW (ref 39.0–52.0)
Hemoglobin: 8.4 g/dL — ABNORMAL LOW (ref 13.0–17.0)
Hemoglobin: 7.8 g/dL — ABNORMAL LOW (ref 13.0–17.0)

## 2019-03-27 LAB — PROTIME-INR
INR: 1.1 (ref 0.8–1.2)
Prothrombin Time: 14 seconds (ref 11.4–15.2)

## 2019-03-27 LAB — FERRITIN: Ferritin: 75 ng/mL (ref 24–336)

## 2019-03-27 LAB — POC OCCULT BLOOD, ED: Fecal Occult Bld: NEGATIVE

## 2019-03-27 LAB — PREPARE RBC (CROSSMATCH)

## 2019-03-27 MED ORDER — SODIUM CHLORIDE 0.9 % IV SOLN
10.0000 mL/h | Freq: Once | INTRAVENOUS | Status: AC
  Administered 2019-03-27: 10 mL/h via INTRAVENOUS

## 2019-03-27 MED ORDER — ACETAMINOPHEN 650 MG RE SUPP: 650.0000 mg | Freq: Four times a day (QID) | RECTAL | Status: DC | PRN

## 2019-03-27 MED ORDER — SULFAMETHOXAZOLE-TRIMETHOPRIM 800-160 MG PO TABS
1.0000 | ORAL_TABLET | Freq: Two times a day (BID) | ORAL | Status: DC
  Administered 2019-03-27 – 2019-03-29 (×4): 1 via ORAL
  Filled 2019-03-27 (×4): qty 1

## 2019-03-27 MED ORDER — SODIUM CHLORIDE 0.9 % IV SOLN
510.0000 mg | Freq: Once | INTRAVENOUS | Status: AC
  Administered 2019-03-27: 510 mg via INTRAVENOUS
  Filled 2019-03-27: qty 17

## 2019-03-27 MED ORDER — QUETIAPINE FUMARATE 50 MG PO TABS
300.0000 mg | ORAL_TABLET | Freq: Every day | ORAL | Status: DC
  Administered 2019-03-27 – 2019-03-28 (×2): 300 mg via ORAL
  Filled 2019-03-27 (×2): qty 6
  Filled 2019-03-27: qty 1

## 2019-03-27 MED ORDER — ONDANSETRON HCL 4 MG/2ML IJ SOLN
4.0000 mg | Freq: Once | INTRAMUSCULAR | Status: AC
  Administered 2019-03-27: 4 mg via INTRAVENOUS
  Filled 2019-03-27: qty 2

## 2019-03-27 MED ORDER — HYDROCODONE-ACETAMINOPHEN 7.5-325 MG PO TABS
1.0000 | ORAL_TABLET | Freq: Four times a day (QID) | ORAL | Status: DC | PRN
  Administered 2019-03-27 – 2019-03-28 (×3): 1 via ORAL
  Filled 2019-03-27 (×3): qty 1

## 2019-03-27 MED ORDER — VENLAFAXINE HCL ER 75 MG PO CP24
225.0000 mg | ORAL_CAPSULE | Freq: Every day | ORAL | Status: DC
  Administered 2019-03-27 – 2019-03-29 (×3): 225 mg via ORAL
  Filled 2019-03-27 (×4): qty 3

## 2019-03-27 MED ORDER — SODIUM CHLORIDE 0.9 % IV BOLUS
1000.0000 mL | Freq: Once | INTRAVENOUS | Status: AC
  Administered 2019-03-27: 1000 mL via INTRAVENOUS

## 2019-03-27 MED ORDER — ACETAMINOPHEN 325 MG PO TABS: 650.0000 mg | ORAL_TABLET | Freq: Four times a day (QID) | ORAL | Status: DC | PRN

## 2019-03-27 MED ORDER — MORPHINE SULFATE (PF) 2 MG/ML IV SOLN
2.0000 mg | Freq: Once | INTRAVENOUS | Status: AC
  Administered 2019-03-27: 2 mg via INTRAVENOUS
  Filled 2019-03-27: qty 1

## 2019-03-27 NOTE — ED Notes (Signed)
Need another blue top

## 2019-03-27 NOTE — H&P (Addendum)
Date: 03/27/2019               Patient Name:  Craig Lopez MRN: [id number]  DOB: 05-07-1960 Age / Sex: 58 y.o., male   PCP: Charolett BumpersKroner, George K, PA-C         Medical Service: Internal Medicine Teaching Service         Attending Physician: Dr. Inez CatalinaMullen, Emily B, MD    First Contact: Darl PikesLanier, MD, Alan Ripperlaire Pager: CL [phone])  Second Contact: Dortha SchwalbeAgyei, MD, [place] Pager: OA [phone])       After Hours (After 5p/  First Contact Pager: [phone]  weekends / holidays): Second Contact Pager: [phone]   Chief Complaint: symptomatic anemia  History of Present Illness: 58 y.o. yo male w/ PMH significant for Osteoarthritis, sleep apnea .  Presents with symptomatic anemia, had not yet started aspirin after discharge.  No known history of bleeding and no family history of bleeding diathesis.  He has had multiple orthopedic procedures over the last two years.  He has never had this issue in the past.  Right knee replacement done three months ago.  He denies abd pain, chest pain, vomiting, diarrhea, he has had some nausea with the pain medicine.  He felt dizzy before his fall he was standing urinating and then afterwards stepped on his left leg felt like it gave way.  He lost consciousness, when he was trying to get up his brother was helping and reported his eyes rolled into the back of his head.   no tongue biting or incontinence. He was not confused afterwards.   His blood pressure was checked and he was orthostatic and dizzy.    ED course: In the Ed patient was given IVF resuscitation and 1 U PRBC's, imaging of the knee and head showed no acute abnormalities and he was hemoccult neg   Lab Orders     SARS CORONAVIRUS 2 (TAT 6-24 HRS) Nasopharyngeal Nasopharyngeal Swab     Urinalysis, Routine w reflex microscopic     Basic metabolic panel     CBC with Differential     CBC     HIV Antibody (routine testing w rflx)     CBC     Comprehensive metabolic panel     Protime-INR     APTT     POC  occult blood, ED   Meds:  Current Outpatient Medications  Medication Instructions  . aspirin EC 81 mg, Oral, 2 times daily  . Bepotastine Besilate (BEPREVE) 1.5 % SOLN Can use one drop in each eye twice daily if needed for itchy eyes.  Marland Kitchen. EPINEPHrine 0.3 mg/0.3 mL IJ SOAJ injection Use as directed for life-threatening allergic reaction.  Marland Kitchen. ketorolac (TORADOL) 10 mg, Oral, 2 times daily PRN  . lidocaine (LIDODERM) 5 % 1 patch, Transdermal, Daily PRN, Remove & Discard patch within 12 hours or as directed by MD  . LUBRICATING PLUS EYE DROPS 0.5 % SOLN 1-2 drops, Both Eyes, 3 times daily PRN  . methocarbamol (ROBAXIN) 750 mg, Oral, 2 times daily PRN  . ondansetron (ZOFRAN ODT) 8 mg, Oral, Every 8 hours PRN  . ondansetron (ZOFRAN) 4-8 mg, Oral, Every 8 hours PRN  . oxyCODONE (OXY IR/ROXICODONE) 5 mg, Oral, Every 4 hours PRN  . oxyCODONE (OXY IR/ROXICODONE) 5-10 mg, Oral, Every 8 hours PRN  . oxyCODONE (OXYCONTIN) 10 mg, Oral, Every 12 hours  . QUEtiapine (SEROQUEL) 300 mg, Oral, Daily at bedtime  . sildenafil (VIAGRA) 100 mg, Oral, Daily PRN  .  sulfamethoxazole-trimethoprim (BACTRIM DS) 800-160 MG tablet 1 tablet, Oral, 2 times daily  . venlafaxine XR (EFFEXOR-XR) 225 mg, Oral, Daily    Allergies: Allergies as of 03/27/2019  . (No Known Allergies)   Past Medical History:  Diagnosis Date  . Arthritis   . Back pain   . Environmental allergies   . Knee problem   . Sleep apnea    rarely uses his CPAP    Family History:  Family History  Problem Relation Age of Onset  . Congestive Heart Failure Mother   . Lung cancer Father        He was a smoker.  . Congestive Heart Failure Sister   . Colon cancer Brother   . Congestive Heart Failure Sister      Social History:  Social History   Tobacco Use  . Smoking status: Never Smoker  . Smokeless tobacco: Never Used  Substance Use Topics  . Alcohol use: Yes    Comment: socially  . Drug use: No     Review of Systems: A complete  ROS was negative except as per HPI.   Physical Exam: Blood pressure (!) 126/91, pulse 82, temperature 97.8 F (36.6 C), temperature source Oral, resp. rate 17, SpO2 100 %. Physical Exam Constitutional:      Appearance: He is obese. He is not diaphoretic.  Eyes:     General: No scleral icterus.       Right eye: No discharge.        Left eye: No discharge.  Cardiovascular:     Rate and Rhythm: Normal rate and regular rhythm.     Heart sounds: Normal heart sounds. No murmur. No friction rub. No gallop.   Pulmonary:     Effort: Pulmonary effort is normal. No respiratory distress.     Breath sounds: Normal breath sounds. No wheezing or rales.  Abdominal:     General: Bowel sounds are normal. There is no distension.     Palpations: Abdomen is soft. There is no mass.     Tenderness: There is no abdominal tenderness. There is no guarding.  Musculoskeletal:     Comments: LLE knee replacement incision site in tact, sutures in place, no erythema, minimal bloody drainage present  Skin:    General: Skin is warm and dry.  Neurological:     General: No focal deficit present.     Mental Status: He is alert. Mental status is at baseline.  Psychiatric:        Mood and Affect: Mood normal.        Behavior: Behavior normal.      EKG: personally reviewed my interpretation is NSR  CXR: personally reviewed my interpretation is None available   Assessment & Plan by Problem: Active Problems:   Symptomatic anemia  Symptomatic Anemia/Syncope: Likely multifactorial volume loss from bleeding causing orthostatic hypotension and lower than normal perfusion given low hgb count.  In addition he was using the restroom just beforehand indicating a possibility of vasovagal contribution. He did have oxycontin 10mg , oxy IR 10mg , gabapentin 300mg , aspirin and celebrex before leaving the hospital.  In addition 1000mg  IV tranexamic acid was used during the prcoedure. The fact that he remained orthostatic later  points more towards volume/blood loss and sedating medicines.  I hear no murmur on exam, he felt no palpitations and has no cardiac history.  Larger than expected drop in hgb post op, did have some soaking of bandage after fall perhaps post op day 1 hgb inaccurate, ?  More blood loss than expected during surgery.    -He is receiving IVF and 1u PRBc's -I will check iron levels and reticulocyte count with the multiple surgeries he may not have been able to respond with appropriate hematopoesis after this last surgery. -repeat basic coagulopathy labs while here by history he did not lose much blood from his surgical site, no family history and multiple procedures without issues -down titrate pain meds -repeat orthostatic vitals later this evening vs am after resuscitation -IV iron if low -monitor for any ongoing blood losses  Osteoarthritis s/p left knee replacement: Surgical site looks good and imaging reassuring for no disruption to joint or joint hardware during fall.    -tapered pain regimen start with tylenol can add PRN's if needed -continue bactrim  PTSD/Sleep disturbance: on seroquel and venlafaxine at home for this  -continue seroquel/venlafaxine   Dispo: Admit patient to Observation with expected length of stay less than 2 midnights.  Signed: Katherine Roan, MD 03/27/2019, 10:18 AM

## 2019-03-27 NOTE — ED Notes (Signed)
Pt taken to CT.

## 2019-03-27 NOTE — Telephone Encounter (Signed)
Refaxed request.

## 2019-03-27 NOTE — ED Notes (Signed)
New dressing obtained and applied to left knee incision.

## 2019-03-27 NOTE — ED Triage Notes (Signed)
Patient arrived with EMS from home lost his balance in the bathroom and fell , denies LOC , left knee surgery Monday this week , reports left knee pain . Hypotensive at triage 70/50, CBG= 114 by EMS .

## 2019-03-27 NOTE — Telephone Encounter (Signed)
Thank you :)

## 2019-03-27 NOTE — Progress Notes (Signed)
Patient admitted to unit from Emergency department, VSS. Patient settled in bed and oriented to unit and hospital routine. Patient has left knee dressing in place with small amount of blood present to dressing. Pt resting comfortably in bed with call bell in reach and side rails up. Instructed patient to utilize call bell for assistance. Will continue to monitor closely for remainder of shift.

## 2019-03-27 NOTE — ED Notes (Signed)
2nd lunch tray ordered per pt's food requests.

## 2019-03-27 NOTE — ED Provider Notes (Signed)
[facility] EMERGENCY DEPARTMENT Provider Note   CSN: [id number] Arrival date & time: 03/27/19  0557     History Chief Complaint  Patient presents with   Craig Lopez is a 58 y.o. male with a history of sleep apnea & multiple prior surgeries including L knee total arthroplasty 03/24/19 by Dr. Roda Shutters who presents to the ED s/p fall with complaints of somewhat worsened left knee pain. Patient states he was getting up using his walker after going to the bathroom when he miss-stepped with the LLE and felt as though his knee gave out leading to a fall. He does not believe he hit his head. He did hit the L knee on the ground resulting in increased blood below his post op bandage. He states when he tried to get up he did get lightheaded/dizzy and may have briefly had LOC. EMS was called. Upon ED arrival he was hypotensive 70/50 therefore nursing staff started fluids. He drank fluids yesterday, ate lunch, did not eat dinner. Patient denies current lightheadedness/dizziness. Denies chest pain, dyspnea, cough, fever, dysuria, N/V/D, melena, hematochezia, numbness, weakness, headache, neck pain, or back pain. Patient states he is not on any blood thinners.    HPI     Past Medical History:  Diagnosis Date   Arthritis    Back pain    Environmental allergies    Knee problem    Sleep apnea    rarely uses his CPAP    Patient Active Problem List   Diagnosis Date Noted   Primary osteoarthritis of left knee 03/24/2019   Status post total left knee replacement 03/24/2019    Past Surgical History:  Procedure Laterality Date   APPENDECTOMY     CHONDROPLASTY Right 11/27/2018   Procedure: CHONDROPLASTY;  Surgeon: Tarry Kos, MD;  Location: [facility];  Service: Orthopedics;  Laterality: Right;   KNEE ARTHROSCOPY Right 11/27/2018   Procedure: RIGHT KNEE ARTHROSCOPY WITH MAJOR SYNOVECTOMY AND CHONDROPLASTY MEDIAL FEMORAL CONDYLE AND TROCHLEA;   Surgeon: Tarry Kos, MD;  Location: [facility];  Service: Orthopedics;  Laterality: Right;   KNEE SURGERY Left    Multiple Knee surgeries    SYNOVECTOMY Right 11/27/2018   Procedure: SYNOVECTOMY;  Surgeon: Tarry Kos, MD;  Location: [facility];  Service: Orthopedics;  Laterality: Right;   TOTAL KNEE ARTHROPLASTY Left 03/24/2019   TOTAL KNEE ARTHROPLASTY Left 03/24/2019   Procedure: LEFT TOTAL KNEE ARTHROPLASTY;  Surgeon: Tarry Kos, MD;  Location: [facility];  Service: Orthopedics;  Laterality: Left;   WISDOM TOOTH EXTRACTION         Family History  Problem Relation Age of Onset   Congestive Heart Failure Mother    Lung cancer Father        He was a smoker.   Congestive Heart Failure Sister    Colon cancer Brother    Congestive Heart Failure Sister     Social History   Tobacco Use   Smoking status: Never Smoker   Smokeless tobacco: Never Used  Substance Use Topics   Alcohol use: Yes    Comment: socially   Drug use: No    Home Medications Prior to Admission medications   Medication Sig Start Date End Date Taking? Authorizing Provider  aspirin EC 81 MG tablet Take 1 tablet (81 mg total) by mouth 2 (two) times daily. 03/24/19   Tarry Kos, MD  Bepotastine Besilate (BEPREVE) 1.5 % SOLN Can use  one drop in each eye twice daily if needed for itchy eyes. Patient taking differently: Place 1 drop into both eyes 2 (two) times daily as needed (itchy eyes. (Spring/Fall ONLY)). Can use one drop in each eye twice daily if needed for itchy eyes. 08/03/15   Kozlow, Alvira PhilipsEric J, MD  EPINEPHrine 0.3 mg/0.3 mL IJ SOAJ injection Use as directed for life-threatening allergic reaction. Patient taking differently: Inject 0.3 mg into the muscle as needed for anaphylaxis. Use as directed for life-threatening allergic reaction. 08/03/15   Kozlow, Alvira PhilipsEric J, MD  ketorolac (TORADOL) 10 MG tablet Take 1 tablet (10 mg total) by mouth 2 (two) times daily as needed.  03/24/19   Tarry KosXu, Naiping M, MD  lidocaine (LIDODERM) 5 % Place 1 patch onto the skin daily as needed (back pain.). Remove & Discard patch within 12 hours or as directed by MD    [provider]  LUBRICATING PLUS EYE DROPS 0.5 % SOLN Place 1-2 drops into both eyes 3 (three) times daily as needed for dry eyes. 10/28/18   [provider]  methocarbamol (ROBAXIN) 750 MG tablet Take 1 tablet (750 mg total) by mouth 2 (two) times daily as needed for muscle spasms. 03/24/19   Tarry KosXu, Naiping M, MD  ondansetron (ZOFRAN ODT) 8 MG disintegrating tablet Take 1 tablet (8 mg total) by mouth every 8 (eight) hours as needed for nausea or vomiting. 05/30/18   Molpus, Jonny RuizJohn, MD  ondansetron (ZOFRAN) 4 MG tablet Take 1-2 tablets (4-8 mg total) by mouth every 8 (eight) hours as needed for nausea or vomiting. 03/24/19   Tarry KosXu, Naiping M, MD  oxyCODONE (OXY IR/ROXICODONE) 5 MG immediate release tablet Take 5 mg by mouth every 4 (four) hours as needed (pain.).    [provider]  oxyCODONE (OXY IR/ROXICODONE) 5 MG immediate release tablet Take 1-2 tablets (5-10 mg total) by mouth every 8 (eight) hours as needed for severe pain. 03/24/19   Tarry KosXu, Naiping M, MD  oxyCODONE (OXYCONTIN) 10 mg 12 hr tablet Take 1 tablet (10 mg total) by mouth every 12 (twelve) hours for 3 days. 03/24/19 03/27/19  Tarry KosXu, Naiping M, MD  QUEtiapine (SEROQUEL) 100 MG tablet Take 300 mg by mouth at bedtime.    [provider]  sildenafil (VIAGRA) 100 MG tablet Take 100 mg by mouth daily as needed for erectile dysfunction. 12/30/18   [provider]  sulfamethoxazole-trimethoprim (BACTRIM DS) 800-160 MG tablet Take 1 tablet by mouth 2 (two) times daily. 03/24/19   Tarry KosXu, Naiping M, MD  venlafaxine XR (EFFEXOR-XR) 75 MG 24 hr capsule Take 225 mg by mouth daily. 12/26/18   [provider]    Allergies    Patient has no known allergies.  Review of Systems   Review of Systems  Constitutional: Negative for chills and fever.   Respiratory: Negative for cough and shortness of breath.   Cardiovascular: Negative for chest pain.  Gastrointestinal: Negative for abdominal pain, blood in stool, diarrhea, nausea and vomiting.  Genitourinary: Negative for dysuria.  Musculoskeletal: Positive for arthralgias. Negative for back pain and neck pain.  Neurological: Positive for dizziness (resolved @ present), syncope (+/-) and light-headedness (resolved @ present). Negative for weakness and numbness.  All other systems reviewed and are negative.   Physical Exam Updated Vital Signs BP 104/68    Pulse 82    Temp 97.8 F (36.6 C) (Oral)    Resp 18    SpO2 95%   Physical Exam Vitals and nursing note reviewed.  Constitutional:      General: He is not in acute distress.    Appearance: He is well-developed. He is not toxic-appearing.  HENT:     Head: Normocephalic and atraumatic.     Comments: No raccoon eyes or battle sign.  Eyes:     General:        Right eye: No discharge.        Left eye: No discharge.     Extraocular Movements: Extraocular movements intact.     Conjunctiva/sclera: Conjunctivae normal.     Pupils: Pupils are equal, round, and reactive to light.  Neck:     Comments: No midline tenderness.  Cardiovascular:     Rate and Rhythm: Normal rate and regular rhythm.     Pulses: Normal pulses.     Comments: 2+ DP pulses.  Pulmonary:     Effort: Pulmonary effort is normal. No respiratory distress.     Breath sounds: Normal breath sounds. No wheezing, rhonchi or rales.  Chest:     Chest wall: No tenderness.  Abdominal:     General: There is no distension.     Palpations: Abdomen is soft.     Tenderness: There is no abdominal tenderness. There is no guarding or rebound.  Musculoskeletal:     Cervical back: Normal range of motion and neck supple.     Comments: Back: No midline tenderness or palpable step off.  UEs: intact AROM without point/focal bony tenderness.  LEs: Anterior L knee surgical site  bandage removed secondary to blood, incision site with sutures in place, mild blood noted, no active bleeding, no erythema/warmth/drainage. Swelling noted. No obvious large hematoma.Tenderness to anterior L knee. Otherwise nontender. No calf tenderness. No edema.   Skin:    General: Skin is warm and dry.     Findings: No rash.  Neurological:     Mental Status: He is alert.     Comments: Clear speech.  CN III through XII grossly intact.  Sensation grossly intact bilateral upper and lower extremities.  5 out of 5 symmetric grip strength.  5 out of 5 strength with plantar dorsiflexion bilaterally.  Psychiatric:        Behavior: Behavior normal.     ED Results / Procedures / Treatments   Labs (all labs ordered are listed, but only abnormal results are displayed) Labs Reviewed  BASIC METABOLIC PANEL - Abnormal; Notable for the following components:      Result Value   Glucose, Bld 112 (*)    BUN 24 (*)    Calcium 8.6 (*)    All other components within normal limits  CBC WITH DIFFERENTIAL/PLATELET - Abnormal; Notable for the following components:   RBC 3.27 (*)    Hemoglobin 8.2 (*)    HCT 26.2 (*)    MCH 25.1 (*)    All other components within normal limits  CBC - Abnormal; Notable for the following components:   RBC 2.88 (*)    Hemoglobin 7.3 (*)    HCT 23.4 (*)    MCH 25.3 (*)    Platelets 148 (*)    All other components within normal limits  SARS CORONAVIRUS 2 (TAT 6-24 HRS)  URINALYSIS, ROUTINE W REFLEX MICROSCOPIC  POC OCCULT BLOOD, ED  PREPARE RBC (CROSSMATCH)    EKG EKG Interpretation  Date/Time:  Thursday March 27 2019 06:25:15 EST Ventricular Rate:  81 PR Interval:    QRS Duration: 96 QT Interval:  374 QTC Calculation: 435 R Axis:   12  Text Interpretation: Sinus rhythm Abnormal R-wave progression, early transition Confirmed by Raeford Razor [phone]) on 03/27/2019 9:53:42 AM   Radiology CT Head Wo Contrast  Result Date: 03/27/2019 CLINICAL DATA:   Mechanical fall this morning, denies loss of consciousness. EXAM: CT HEAD WITHOUT CONTRAST CT CERVICAL SPINE WITHOUT CONTRAST TECHNIQUE: Multidetector CT imaging of the head and cervical spine was performed following the standard protocol without intravenous contrast. Multiplanar CT image reconstructions of the cervical spine were also generated. COMPARISON:  CT head/cervical spine 02/02/2017 FINDINGS: CT HEAD FINDINGS Brain: No evidence of acute intracranial hemorrhage. No demarcated cortical infarction. No evidence of intracranial mass. No midline shift or extra-axial fluid collection. Cerebral volume is normal for age. Vascular: No hyperdense vessel. Skull: Normal. Negative for fracture or focal lesion. Sinuses/Orbits: Visualized orbits demonstrate no acute abnormality. Mild ethmoid sinus mucosal thickening. Partial opacification of posterior right ethmoid air cells. No significant mastoid effusion. CT CERVICAL SPINE FINDINGS Alignment: Normal. Skull base and vertebrae: The basion-dental and atlanto-dental intervals are maintained.No evidence of acute fracture to the cervical spine. Congenital nonunion of the posterior arch of C1 Soft tissues and spinal canal: No prevertebral fluid or swelling. No visible canal hematoma. Disc levels: No high-grade bony spinal canal or neural foraminal narrowing at any level. Upper chest: No consolidation within the imaged lung apices IMPRESSION: CT head: 1. No evidence of acute intracranial abnormality. 2. Paranasal sinus disease as described. CT cervical spine: No evidence of acute fracture to the cervical spine. Electronically Signed   By: Jackey Loge DO   On: 03/27/2019 07:15   CT Cervical Spine Wo Contrast  Result Date: 03/27/2019 CLINICAL DATA:  Mechanical fall this morning, denies loss of consciousness. EXAM: CT HEAD WITHOUT CONTRAST CT CERVICAL SPINE WITHOUT CONTRAST TECHNIQUE: Multidetector CT imaging of the head and cervical spine was performed following the  standard protocol without intravenous contrast. Multiplanar CT image reconstructions of the cervical spine were also generated. COMPARISON:  CT head/cervical spine 02/02/2017 FINDINGS: CT HEAD FINDINGS Brain: No evidence of acute intracranial hemorrhage. No demarcated cortical infarction. No evidence of intracranial mass. No midline shift or extra-axial fluid collection. Cerebral volume is normal for age. Vascular: No hyperdense vessel. Skull: Normal. Negative for fracture or focal lesion. Sinuses/Orbits: Visualized orbits demonstrate no acute abnormality. Mild ethmoid sinus mucosal thickening. Partial opacification of posterior right ethmoid air cells. No significant mastoid effusion. CT CERVICAL SPINE FINDINGS Alignment: Normal. Skull base and vertebrae: The basion-dental and atlanto-dental intervals are maintained.No evidence of acute fracture to the cervical spine. Congenital nonunion of the posterior arch of C1 Soft tissues and spinal canal: No prevertebral fluid or swelling. No visible canal hematoma. Disc levels: No high-grade bony spinal canal or neural foraminal narrowing at any level. Upper chest: No consolidation within the imaged lung apices IMPRESSION: CT head: 1. No evidence of acute intracranial abnormality. 2. Paranasal sinus disease as described. CT cervical spine: No evidence of acute fracture to the cervical spine. Electronically Signed   By: Jackey Loge DO   On: 03/27/2019 07:15   DG Knee Complete 4 Views Left  Result Date: 03/27/2019 CLINICAL DATA:  Post recent left knee replacement with fall last night. EXAM: LEFT KNEE - COMPLETE 4+ VIEW COMPARISON:  03/24/2019 FINDINGS: Evidence of region total knee arthroplasty as prosthetic components are intact and unchanged. No acute fracture or dislocation. Findings suggesting joint effusion. IMPRESSION: 1.  No acute fracture or dislocation. 2. Left knee arthroplasty intact and unchanged. Small joint effusion. Electronically Signed   By: Reuel Boom  Derrel Nip M.D.   On: 03/27/2019 07:20    Procedures .Critical Care Performed by: Amaryllis Dyke, PA-C Authorized by: Amaryllis Dyke, PA-C    CRITICAL CARE Performed by: Kennith Maes   Total critical care time: 30 minutes  Critical care time was exclusive of separately billable procedures and treating other patients.  Critical care was necessary to treat or prevent imminent or life-threatening deterioration.  Critical care was time spent personally by me on the following activities: development of treatment plan with patient and/or surrogate as well as nursing, discussions with consultants, evaluation of patient's response to treatment, examination of patient, obtaining history from patient or surrogate, ordering and performing treatments and interventions, ordering and review of laboratory studies, ordering and review of radiographic studies, pulse oximetry and re-evaluation of patient's condition.  (including critical care time)  Medications Ordered in ED Medications  0.9 %  sodium chloride infusion (has no administration in time range)  sodium chloride 0.9 % bolus 1,000 mL (0 mLs Intravenous Stopped 03/27/19 0740)  ondansetron (ZOFRAN) injection 4 mg (4 mg Intravenous Given 03/27/19 0629)  sodium chloride 0.9 % bolus 1,000 mL (1,000 mLs Intravenous New Bag/Given 03/27/19 0743)    ED Course/MDM I have reviewed the triage vital signs and the nursing notes.  Pertinent labs & imaging results that were available during my care of the patient were reviewed by me and considered in my medical decision making (see chart for details).   Patient presents to the ED s/p mechanical fall with somewhat of near syncope vs. Syncope when trying to get back up after fall. He is nontoxic appearing, resting comfortably vitals initially with hypotension 70s/50s which had improved upon my evaluation, otherwise WNL. CT head/C spine by triage negative for head bleed or fx. L knee xray  by triage revealed no acute fracture or dislocation, left knee arthroplasty intact and unchanged. Small joint effusion--> surgical incision site had some mild bleeding s/p fall, bandaged was removed, no active bleeding. Appears to be healing appropriately without signs of infection. Will attempt to get same bandage as prior from the floor as plan was for this to be in place for 1 week post op. He is NVI distally. Calf is soft, nontender, no edema, doubt DVT. No midline spinal tenderness or chest/abdominal tenderness, do not feel further imaging is necessary. Given his initial hypotension will check basic labs, give fluids & reassess.   EKG: No STEMI. CBC: anemia with hgb 8.2 hct 26.2 downtrending from recent admission @ 11.2 & 35.3 respectively. No leukocytosis/leukopenia.  BMP: Mild hypocalcemia @ 8.6, no significant electrolyte derangement. Creatinine improved, BUN mildly elevated.   Given anemia fecal occult was checked with chaperone present: DRE soft brown stool, fecal occult negative. Patient denies anticoagulation post op- using compression stockings. Following 2Ls of fluid rechecked orthostatic vital signs:   Orthostatic Lying  BP- Lying:107/86 Pulse- Lying:80 Orthostatic Sitting BP- Sitting:118/98 (!) Pulse- Sitting:98 Orthostatic Standing at 3 minutes BP- Standing at 3 minutes:88/73 (!) Pulse- Standing at 3 minutes:105  Hgb/hct downtrending with repeat labs, likely degree of this is related to fluids given in the ED, however given he has downtrended from 14.1/45.1 (pre-op) to 7.3/23.4 over 1 week and he remains hypotensive with orthostatics will transfuse 1 unit for symptomatic anemia and consult for unassigned medical admission.   09:58: CONSULT: Discussed with internal medicine residency service- accepts admission.   Patient & his wife @ bedside updated on results & plan of care, provided opportunity for questions, they have confirmed understanding  and are in agreement.   Craig Lopez was evaluated in Emergency Department on 03/27/2019 for the symptoms described in the history of present illness. He/she was evaluated in the context of the global COVID-19 pandemic, which necessitated consideration that the patient might be at risk for infection with the SARS-CoV-2 virus that causes COVID-19. Institutional protocols and algorithms that pertain to the evaluation of patients at risk for COVID-19 are in a state of rapid change based on information released by regulatory bodies including the CDC and federal and state organizations. These policies and algorithms were followed during the patient's care in the ED.  Findings and plan of care discussed with supervising physician Dr. Juleen China who is in agreement.   Final Clinical Impression(s) / ED Diagnoses Final diagnoses:  Fall, initial encounter  Syncope, unspecified syncope type  Symptomatic anemia    Rx / DC Orders ED Discharge Orders    None       Cherly Anderson, PA-C 03/27/19 1015    Raeford Razor, MD 03/27/19 1052

## 2019-03-27 NOTE — Care Management (Signed)
Called [facility] notification line   ID reference [phone]  Magdalen Spatz RN

## 2019-03-28 ENCOUNTER — Observation Stay (HOSPITAL_COMMUNITY): Payer: No Typology Code available for payment source

## 2019-03-28 DIAGNOSIS — R55 Syncope and collapse: Secondary | ICD-10-CM | POA: Diagnosis not present

## 2019-03-28 DIAGNOSIS — G8929 Other chronic pain: Secondary | ICD-10-CM | POA: Diagnosis not present

## 2019-03-28 DIAGNOSIS — M1712 Unilateral primary osteoarthritis, left knee: Secondary | ICD-10-CM | POA: Diagnosis not present

## 2019-03-28 DIAGNOSIS — D649 Anemia, unspecified: Secondary | ICD-10-CM | POA: Diagnosis not present

## 2019-03-28 LAB — COMPREHENSIVE METABOLIC PANEL
ALT: 18 U/L (ref 0–44)
AST: 27 U/L (ref 15–41)
Albumin: 2.8 g/dL — ABNORMAL LOW (ref 3.5–5.0)
Alkaline Phosphatase: 45 U/L (ref 38–126)
Anion gap: 7 (ref 5–15)
BUN: 17 mg/dL (ref 6–20)
CO2: 29 mmol/L (ref 22–32)
Calcium: 8.5 mg/dL — ABNORMAL LOW (ref 8.9–10.3)
Chloride: 102 mmol/L (ref 98–111)
Creatinine, Ser: 1.08 mg/dL (ref 0.61–1.24)
GFR calc Af Amer: >60 mL/min (ref >60)
GFR calc non Af Amer: >60 mL/min (ref >60)
Glucose, Bld: 98 mg/dL (ref 70–99)
Potassium: 4 mmol/L (ref 3.5–5.1)
Sodium: 138 mmol/L (ref 135–145)
Total Bilirubin: 0.9 mg/dL (ref 0.3–1.2)
Total Protein: 5.8 g/dL — ABNORMAL LOW (ref 6.5–8.1)

## 2019-03-28 LAB — CBC
HCT: 23.5 % — ABNORMAL LOW (ref 39.0–52.0)
HCT: 24.5 % — ABNORMAL LOW (ref 39.0–52.0)
Hemoglobin: 7.6 g/dL — ABNORMAL LOW (ref 13.0–17.0)
Hemoglobin: 8.1 g/dL — ABNORMAL LOW (ref 13.0–17.0)
MCH: 26.6 pg (ref 26.0–34.0)
MCH: 26.4 pg (ref 26.0–34.0)
MCHC: 32.3 g/dL (ref 30.0–36.0)
MCHC: 33.1 g/dL (ref 30.0–36.0)
MCV: 82.2 fL (ref 80.0–100.0)
MCV: 79.8 fL — ABNORMAL LOW (ref 80.0–100.0)
Platelets: 169 10*3/uL (ref 150–400)
Platelets: 182 10*3/uL (ref 150–400)
RBC: 2.86 MIL/uL — ABNORMAL LOW (ref 4.22–5.81)
RBC: 3.07 MIL/uL — ABNORMAL LOW (ref 4.22–5.81)
RDW: 15 % (ref 11.5–15.5)
RDW: 14.9 % (ref 11.5–15.5)
WBC: 5.4 10*3/uL (ref 4.0–10.5)
WBC: 5.4 10*3/uL (ref 4.0–10.5)
nRBC: 0 % (ref 0.0–0.2)
nRBC: 0 % (ref 0.0–0.2)

## 2019-03-28 LAB — TYPE AND SCREEN
ABO/RH(D): B POS
Antibody Screen: NEGATIVE
Unit division: 0

## 2019-03-28 LAB — BPAM RBC
Blood Product Expiration Date: 202101052359
ISSUE DATE / TIME: 202012101255
Unit Type and Rh: 7300

## 2019-03-28 MED ORDER — HYDROCODONE-ACETAMINOPHEN 10-325 MG PO TABS
1.0000 | ORAL_TABLET | Freq: Four times a day (QID) | ORAL | Status: DC | PRN
  Administered 2019-03-28 (×2): 1 via ORAL
  Filled 2019-03-28 (×2): qty 1

## 2019-03-28 MED ORDER — IOHEXOL 350 MG/ML SOLN
100.0000 mL | Freq: Once | INTRAVENOUS | Status: AC | PRN
  Administered 2019-03-28: 100 mL via INTRAVENOUS

## 2019-03-28 MED ORDER — POLYSACCHARIDE IRON COMPLEX 150 MG PO CAPS
150.0000 mg | ORAL_CAPSULE | Freq: Every day | ORAL | Status: DC
  Administered 2019-03-28 – 2019-03-29 (×2): 150 mg via ORAL
  Filled 2019-03-28 (×3): qty 1

## 2019-03-28 NOTE — Progress Notes (Signed)
   Subjective: Patient resting in bed on exam.  Discussed events around syncopal episode. Patient denies prodromal experience. Describes he was finished urinated and turned to his sink then fell. He hit his knee and it began to bleed . Denies loosing consciousness at this time. Says his wife called his brother and he did have episodes he doesn't remember clearly during this time. He can not quantify blood loss , but reports bandage was soaked. Denies any SOB, chest pain, or new painful areas in his leg.   Objective:  Vital signs in last 24 hours: Vitals:   03/27/19 1600 03/27/19 1630 03/27/19 2109 03/28/19 0458  BP: 110/65 116/76 130/78 123/78  Pulse: 84 78 96 89  Resp: (!) 23 20 18 19   Temp:  98 F (36.7 C) 98.7 F (37.1 C) 98.4 F (36.9 C)  TempSrc:  Oral Oral Oral  SpO2: 100% 94% 99% 97%   Physical Exam  Constitutional: He is well-developed, well-nourished, and in no distress.  Cardiovascular: Normal rate and regular rhythm. Exam reveals no gallop and no friction rub.  No murmur heard. Pulmonary/Chest: Breath sounds normal. He has no wheezes. He has no rales. He exhibits no tenderness.  Abdominal: Soft. Bowel sounds are normal.  Musculoskeletal:     Comments: Bandage in place over excision, quarter size of blood under bandage. Other areas clean. Did not remove bandage as reported incisions are intact.      Assessment/Plan:  Active Problems:   Symptomatic anemia  58 year old male with past medical history significant for osteoarthritis and OSA who presents with symptomatic anemia.  #Symptomatic anemia, syncope -Etiology for syncope unclear.   - Patient reports colonoscopies every 5 years due to family history. No history of GI bleeds, negative FOBT. -Patient had Hgb of 8.2 on presentation, discharge with Hgb of 11.2. Patient cannot quantify blood loss from fall.  - Considered PE due to quick onset of syncope and without prodrome . No PE seen on CTPE - Normocytic anemia with  low iron and ferritin, increase in immature retics.,  Received IV iron overnight. - Post transfusion H&H 8.4, appropriate response.  - Repeat orthostatics - Repeat CBC this evening   #Osteoarthritis s/p left knee replacement - Sutures in place and imaging showed no disruption to hardware.  -Talked to Dr. Erlinda Hong, and he will visit patient while he is in the hospital. - Pain uncontrolled. Patient on chronic 5mg  Oxycodone, Increase Norco from 7.5 to 10. - Continue Bactrim , Started 12/7 for 10 day course . Stop Date 12/16  #PTSD/Sleep disturbance - Continue Seroquel and venlafaxine   Dispo: Anticipated discharge in approximately 0-1 days.   Tamsen Snider, MD PGY1  [phone]

## 2019-03-28 NOTE — Progress Notes (Signed)
I saw Craig Lopez today and I changed his surgical bandage to an Aquacel which should not need to be changed until he comes back to see me in the office.  I have ordered physical therapy as well as a CPM to begin immediately so that he can continue to rehab from his left knee replacement.  I will plan on seeing him in the office for his first scheduled postop visit.  Azucena Cecil, MD [facility] [phone] 4:26 PM

## 2019-03-29 DIAGNOSIS — M1712 Unilateral primary osteoarthritis, left knee: Secondary | ICD-10-CM | POA: Diagnosis not present

## 2019-03-29 DIAGNOSIS — D509 Iron deficiency anemia, unspecified: Secondary | ICD-10-CM

## 2019-03-29 DIAGNOSIS — G479 Sleep disorder, unspecified: Secondary | ICD-10-CM

## 2019-03-29 DIAGNOSIS — F4310 Post-traumatic stress disorder, unspecified: Secondary | ICD-10-CM

## 2019-03-29 DIAGNOSIS — R55 Syncope and collapse: Secondary | ICD-10-CM | POA: Diagnosis not present

## 2019-03-29 DIAGNOSIS — Z79899 Other long term (current) drug therapy: Secondary | ICD-10-CM

## 2019-03-29 DIAGNOSIS — G4733 Obstructive sleep apnea (adult) (pediatric): Secondary | ICD-10-CM

## 2019-03-29 DIAGNOSIS — Z79891 Long term (current) use of opiate analgesic: Secondary | ICD-10-CM

## 2019-03-29 DIAGNOSIS — Z9889 Other specified postprocedural states: Secondary | ICD-10-CM

## 2019-03-29 DIAGNOSIS — Z96652 Presence of left artificial knee joint: Secondary | ICD-10-CM

## 2019-03-29 LAB — CBC
HCT: 25.2 % — ABNORMAL LOW (ref 39.0–52.0)
Hemoglobin: 8.2 g/dL — ABNORMAL LOW (ref 13.0–17.0)
MCH: 26.6 pg (ref 26.0–34.0)
MCHC: 32.5 g/dL (ref 30.0–36.0)
MCV: 81.8 fL (ref 80.0–100.0)
Platelets: 216 10*3/uL (ref 150–400)
RBC: 3.08 MIL/uL — ABNORMAL LOW (ref 4.22–5.81)
RDW: 15.1 % (ref 11.5–15.5)
WBC: 5.3 10*3/uL (ref 4.0–10.5)
nRBC: 0 % (ref 0.0–0.2)

## 2019-03-29 LAB — BASIC METABOLIC PANEL
Anion gap: 10 (ref 5–15)
BUN: 12 mg/dL (ref 6–20)
CO2: 26 mmol/L (ref 22–32)
Calcium: 8.8 mg/dL — ABNORMAL LOW (ref 8.9–10.3)
Chloride: 102 mmol/L (ref 98–111)
Creatinine, Ser: 1.3 mg/dL — ABNORMAL HIGH (ref 0.61–1.24)
GFR calc Af Amer: >60 mL/min (ref >60)
GFR calc non Af Amer: >60 mL/min (ref >60)
Glucose, Bld: 88 mg/dL (ref 70–99)
Potassium: 3.9 mmol/L (ref 3.5–5.1)
Sodium: 138 mmol/L (ref 135–145)

## 2019-03-29 MED ORDER — FERROUS SULFATE 325 (65 FE) MG PO TABS: 325.0000 mg | ORAL_TABLET | Freq: Every day | ORAL | 0 refills | Status: AC

## 2019-03-29 MED ORDER — POLYSACCHARIDE IRON COMPLEX 150 MG PO CAPS: 150.0000 mg | ORAL_CAPSULE | Freq: Every day | ORAL | 0 refills | Status: DC

## 2019-03-29 NOTE — Discharge Summary (Addendum)
Name: Craig Lopez MRN: [id number] DOB: 10-17-1960 58 y.o. PCP: Merry Proud, PA-C  Date of Admission: 03/27/2019  5:57 AM Date of Discharge: 03/29/2019 Attending Physician: Lalla Brothers  Discharge Diagnosis: 1. Syncope due to anemia 2. Post-operative Symptomatic Anemia 3. Recent total knee arthroplasty  Discharge Medications: Allergies as of 03/29/2019   No Known Allergies      Medication List     STOP taking these medications    ondansetron 8 MG disintegrating tablet Commonly known as: Zofran ODT       TAKE these medications    aspirin EC 81 MG tablet Take 1 tablet (81 mg total) by mouth 2 (two) times daily.   Bepotastine Besilate 1.5 % Soln Commonly known as: Bepreve Can use one drop in each eye twice daily if needed for itchy eyes. What changed:  how much to take how to take this when to take this reasons to take this   EPINEPHrine 0.3 mg/0.3 mL Soaj injection Commonly known as: EPI-PEN Use as directed for life-threatening allergic reaction. What changed:  how much to take how to take this when to take this reasons to take this   ferrous sulfate 325 (65 FE) MG tablet Take 1 tablet (325 mg total) by mouth daily.   ketorolac 10 MG tablet Commonly known as: TORADOL Take 1 tablet (10 mg total) by mouth 2 (two) times daily as needed.   lidocaine 5 % Commonly known as: LIDODERM Place 1 patch onto the skin daily as needed (back pain.). Remove & Discard patch within 12 hours or as directed by MD   Lubricating Plus Eye Drops 0.5 % Soln Generic drug: Carboxymethylcellulose Sod PF Place 1-2 drops into both eyes 3 (three) times daily as needed for dry eyes.   methocarbamol 750 MG tablet Commonly known as: ROBAXIN Take 1 tablet (750 mg total) by mouth 2 (two) times daily as needed for muscle spasms.   ondansetron 4 MG tablet Commonly known as: ZOFRAN Take 1-2 tablets (4-8 mg total) by mouth every 8 (eight) hours as needed for nausea or  vomiting.   oxyCODONE 5 MG immediate release tablet Commonly known as: Oxy IR/ROXICODONE Take 5 mg by mouth every 4 (four) hours as needed (pain.). What changed: Another medication with the same name was removed. Continue taking this medication, and follow the directions you see here.   oxyCODONE 5 MG immediate release tablet Commonly known as: Oxy IR/ROXICODONE Take 1-2 tablets (5-10 mg total) by mouth every 8 (eight) hours as needed for severe pain. What changed: Another medication with the same name was removed. Continue taking this medication, and follow the directions you see here.   QUEtiapine 100 MG tablet Commonly known as: SEROQUEL Take 300 mg by mouth at bedtime.   sildenafil 100 MG tablet Commonly known as: VIAGRA Take 100 mg by mouth daily as needed for erectile dysfunction.   sulfamethoxazole-trimethoprim 800-160 MG tablet Commonly known as: BACTRIM DS Take 1 tablet by mouth 2 (two) times daily.   venlafaxine XR 75 MG 24 hr capsule Commonly known as: EFFEXOR-XR Take 225 mg by mouth daily.        Disposition and follow-up:   Mr.Khalel E Lile was discharged from [facility] in Stable condition.  At the hospital follow up visit please address:   #Syncope  -assess for any symptoms since leaving hospital.  # Iron deficiency anemia - CBC  - Patient reports he is up to date on colonoscopies and FOBT negative.  2.  Labs / imaging needed at time of follow-up: CBC  3.  Pending labs/ test needing follow-up:   Follow-up Appointments: Follow-up Information     Tarry Kos, MD Follow up.   Specialty: Orthopedic Surgery Why: Follow up at your scheduled post op appoitment Contact information: [address] [place] [address] [phone]            Hospital Course by problem list: 1.  #Syncope -Syncope likely due to being volume down, orthostatic on presentation.-Patient had Hgb of 8.2 on presentation discharged the prior  day from L. Knee arthroplasty. Hgb on admission likely represents acute blood loss after surgery. His symptoms resolved with IV fluids and one blood transfusion.  Pulmonary embolism ruled out with CTA chest. There was no active bleeding seen, no GI bleeding, and CT of the knee showed no significant joint space bleeding around the new prosthesis. By the day of discharge, the patient ambulated without symptoms. He was discharged with follow up with PCP next week to check CBC.    #Symptomatic anemia - Normocytic anemia with low iron and ferritin, increase in immature retics.  Received feraheme  on 12/11. Symptomatic , received 1 unit RBC Post transfusion H&H 8.4, appropriate response.   #Osteoarthritis s/p left knee arthroplasty  - Sutures in place and imaging showed no disruption to hardware.  - Seen by Dr. Roda Shutters, dressing change, follow up scheduled - Patient on chronic 5mg  Oxycodone -  Pain controlled.Continue Norco 10 mg in hospital - Continue Bactrim , Started 12/7 for 10 day course . Stop Date 12/16   #PTSD/Sleep disturbance - Continue Seroquel and venlafaxine        Discharge Vitals:   BP 119/82 (BP Location: Left Arm)   Pulse 86   Temp 98 F (36.7 C) (Oral)   Resp 16   SpO2 100%   Pertinent Labs, Studies, and Procedures:  CBC Latest Ref Rng & Units 03/29/2019 03/28/2019 03/28/2019  WBC 4.0 - 10.5 K/uL 5.3 5.4 5.4  Hemoglobin 13.0 - 17.0 g/dL 8.2(L) 8.1(L) 7.6(L)  Hematocrit 39.0 - 52.0 % 25.2(L) 24.5(L) 23.5(L)  Platelets 150 - 400 K/uL 216 182 169   BMP Latest Ref Rng & Units 03/29/2019 03/28/2019 03/27/2019  Glucose 70 - 99 mg/dL 88 98 14/01/2019)  BUN 6 - 20 mg/dL 12 17 742(V)  Creatinine 0.61 - 1.24 mg/dL 95(G) 3.87(F 6.43  Sodium 135 - 145 mmol/L 138 138 139  Potassium 3.5 - 5.1 mmol/L 3.9 4.0 3.6  Chloride 98 - 111 mmol/L 102 102 103  CO2 22 - 32 mmol/L 26 29 29   Calcium 8.9 - 10.3 mg/dL 3.29) ) 5.1(O)   Imaging:  CT Head WO Contrast 12/10 IMPRESSION: CT head:    1. No evidence of acute intracranial abnormality. 2. Paranasal sinus disease as described.    EXAM: LEFT KNEE - COMPLETE 4+ VIEW 12/10   COMPARISON:  03/24/2019   FINDINGS: Evidence of region total knee arthroplasty as prosthetic components are intact and unchanged. No acute fracture or dislocation. Findings suggesting joint effusion.   IMPRESSION: 1.  No acute fracture or dislocation.   2. Left knee arthroplasty intact and unchanged. Small joint effusion.   CTPE on 12/11  IMPRESSION: 1. No pulmonary embolus. 2. Minimal right pleural effusion. 3. Mild dependent atelectasis of posterior lung bases are noted.  CT left knee 12/11 IMPRESSION: 1. Moderate joint effusion with an expected degree of edema and postsurgical air in the soft tissues around the knee. 2. No evidence of  a soft tissue hematoma to explain the patient's decreasing hemoglobin.  Discharge Instructions: Discharge Instructions     Diet - low sodium heart healthy   Complete by: As directed    Increase activity slowly   Complete by: As directed        Signed:  Thurmon FairJeff Avaline Stillson, MD PGY1  [phone]

## 2019-03-29 NOTE — Progress Notes (Signed)
   Subjective:  No overnight events. Patient reports pain is controlled. He ambulated in room and has been asymptomatic.   Objective:  Vital signs in last 24 hours: Vitals:   03/28/19 0458 03/28/19 1338 03/28/19 2109 03/29/19 0527  BP: 123/78 126/84 132/85 119/82  Pulse: 89 93 91 86  Resp: 19 18 16 16   Temp: 98.4 F (36.9 C) 98.2 F (36.8 C) 98.5 F (36.9 C) 98 F (36.7 C)  TempSrc: Oral Oral Oral Oral  SpO2: 97% 100% 99% 100%   Physical Exam  Constitutional: He is well-developed, well-nourished, and in no distress.  Cardiovascular: Normal rate and regular rhythm. Exam reveals no gallop and no friction rub.  No murmur heard. Pulmonary/Chest: Breath sounds normal. He has no wheezes. He has no rales. He exhibits no tenderness.  Abdominal: Soft. Bowel sounds are normal.  Musculoskeletal:     Comments: Aquacel in place with compression stocking over.     Assessment/Plan:  Active Problems:   Symptomatic anemia  58 year old male with past medical history significant for osteoarthritis and OSA who presents with symptomatic anemia.  #Syncope -Syncope likely due to being volume down, orthostatic on presentation.-Patient had Hgb of 8.2 on presentation discharged the prior day from L. Knee arthroplasty. Hgb on admission likely represents acute blood loss after surgery. Overall volume status likely caused syncope.   Other causes considered were cardiogenic and vasovagal. - No prodromal experience makes vasovagal less likely. No PE seen on CTPE, sinus rhythm on EKG, FOBT negative, up to date on colonoscopies.  - Ambulate today and monitor symptoms. If patient symptomatic will transfuse. If stable plan to discharge with follow up with PCP next week to check CBC.   #Symptomatic anemia - Normocytic anemia with low iron and ferritin, increase in immature retics.  Received feraheme  on 12/11. Symptomatic , received 1 unit RBC Post transfusion H&H 8.4, appropriate response.  - Plan as above   #Osteoarthritis s/p left knee arthroplasty  - Sutures in place and imaging showed no disruption to hardware.  - Seen by Dr. Erlinda Hong, dressing change, follow up scheduled - Patient on chronic 5mg  Oxycodone -  Pain controlled.Continue Norco 10 mg in hospital - Continue Bactrim , Started 12/7 for 10 day course . Stop Date 12/16  #PTSD/Sleep disturbance - Continue Seroquel and venlafaxine   Dispo: Anticipated discharge in approximately 0-1 days.   Tamsen Snider, MD PGY1  [phone]

## 2019-03-29 NOTE — Discharge Instructions (Signed)
Mr. Homewood,  It is likely more had a syncopal event due to being volume down after your surgery. In addition we found you have iron deficiency anemia.  You were given 1 unit of blood and also given IV iron.  On discharge we will prescribe oral iron supplement for you to take dailey.   -Follow-up with your PCP in 1week in order to have your blood counts checked -Follow-up with at your scheduled appointment with Dr. Erlinda Hong  Thank you for trusting [place] with your care during her hospital stay.

## 2019-03-31 ENCOUNTER — Telehealth: Payer: Self-pay | Admitting: Internal Medicine

## 2019-03-31 ENCOUNTER — Telehealth: Payer: Self-pay | Admitting: Orthopaedic Surgery

## 2019-03-31 NOTE — Telephone Encounter (Signed)
Call from pt's wife, Synetta Shadow - stated was instructed to call if pt has black stools; pt woke up this am with black stool. Not [place] pt - was seen by our service while in the hospital. D/C summary per Dr Court Joy - I will forward concern to him. Telephone# [phone].

## 2019-03-31 NOTE — Telephone Encounter (Signed)
Called to approve orders 

## 2019-03-31 NOTE — Telephone Encounter (Signed)
Received call from Verdis Frederickson (PT) with [facility] needing verbal orders for HHPT  3 wk 1 and twice a week for 1 Wk   The number to contact Verdis Frederickson is [phone]

## 2019-03-31 NOTE — Telephone Encounter (Signed)
Craig Lopez calling to report the patietn was D/C over the weekend on Dr. Autumn Patty Service and woke up this morning with Black Stool.  Transferred call to the Triage Nurse.

## 2019-04-01 NOTE — Telephone Encounter (Signed)
Called patients home and cell phone with no answer. Will call again in a hour. Patient started on oral iron at discharge and I expect this explains black stool as FOBT was negative during admission. However, I will assess symptoms and discuss if patient has been scheduled for recommended follow up with his PCP for CBC.

## 2019-04-01 NOTE — Telephone Encounter (Signed)
Patient asymptomatic today. In patients words " his wife is the worrying type, but he feels fine". Yesterday patient had one episode he describes as lightheadedness while ambulating. He noticed his stool was dark charcoal color yesterday.  He started IV iron at home after he noticed dark stools. Reviewed chart and patient received 2 doses of oral iron in hospital in addition to his IV Iron. Patient reports good po intake.   Dark stools likely 2/2 oral iron supplement. He has follow up with Dr.Xu on 22nd, but has not scheduled follow up with his PCP. Recommend follow up this week to check CBC, but patient says he will schedule only if he is felling bad.

## 2019-04-02 ENCOUNTER — Telehealth: Payer: Self-pay

## 2019-04-02 NOTE — Telephone Encounter (Signed)
Any updates? 

## 2019-04-02 NOTE — Telephone Encounter (Signed)
Patient called concerning machine that he was suppose to have when he was released from the hospital.  Patient had left [facility] on 03/24/2019.  I did see in the chart that an authorization was required through the [place] and that it was faxed.  Cb# for patient is [phone].  Please advise.  Thank you.

## 2019-04-02 NOTE — Telephone Encounter (Signed)
Just spoke with Alesia Banda at the [facility].  He is issuing a purchase order and will have [employer] provide patient with CPM.

## 2019-04-02 NOTE — Telephone Encounter (Signed)
yes

## 2019-04-02 NOTE — Telephone Encounter (Signed)
Craig Lopez from [facility]. Received Rx for Ketoralac - non formulary . Asking if this can be switched to Celebrex or Nabumetone if yes, please send into the [facility].   Kate CB: Alpha: [phone] Fax: [phone]

## 2019-04-03 ENCOUNTER — Other Ambulatory Visit: Payer: Self-pay

## 2019-04-03 MED ORDER — CELECOXIB 200 MG PO CAPS: 200.0000 mg | ORAL_CAPSULE | Freq: Two times a day (BID) | ORAL | 0 refills | Status: DC

## 2019-04-03 NOTE — Telephone Encounter (Signed)
Which Rx would you like? Instructions, quantity, etc.

## 2019-04-03 NOTE — Telephone Encounter (Signed)
Called patient to advise. He is aware.  

## 2019-04-03 NOTE — Telephone Encounter (Signed)
Celebrex 200 mg BID #60

## 2019-04-03 NOTE — Telephone Encounter (Signed)
Sent in to pharm.

## 2019-04-08 ENCOUNTER — Other Ambulatory Visit: Payer: Self-pay

## 2019-04-08 ENCOUNTER — Ambulatory Visit (INDEPENDENT_AMBULATORY_CARE_PROVIDER_SITE_OTHER): Payer: No Typology Code available for payment source | Admitting: Physician Assistant

## 2019-04-08 ENCOUNTER — Telehealth: Payer: Self-pay

## 2019-04-08 ENCOUNTER — Encounter: Payer: Self-pay | Admitting: Physician Assistant

## 2019-04-08 DIAGNOSIS — Z96652 Presence of left artificial knee joint: Secondary | ICD-10-CM

## 2019-04-08 NOTE — Progress Notes (Signed)
Post-Op Visit Note   Patient: Craig Lopez           Date of Birth: April 16, 1961           MRN: [id number] Visit Date: 04/08/2019 PCP: Merry Proud, PA-C   Assessment & Plan:  Chief Complaint:  Chief Complaint  Patient presents with  . Left Knee - Follow-up   Visit Diagnoses:  1. S/P TKR (total knee replacement), left     Plan: Patient is a pleasant 58 year old gentleman who presents our clinic today 2 weeks status post left total knee replacement 03/24/2019.  He has been doing well.  He was admitted a couple days after surgery for a syncopal episode likely from acute blood loss anemia.  He was given blood and iron and has recovered well.  He is back at home getting home health physical therapy.  No lightheadedness or dizziness.  Examination of his left knee reveals a well-healing surgical incision with nylon sutures in place.  Calf is soft and nontender.  He is neurovascularly intact distally.  Today, nylon sutures were removed and Steri-Strips applied.  We will extend home health physical therapy for another 4 weeks or until outpatient physical therapy is approved due to this being filed under [employer].  He will follow-up with [place] in 4 weeks time for repeat evaluation and 2 view x-rays of the left knee.  Dental prophylaxis reinforced.  Call with concerns or questions.  Follow-Up Instructions: Return in about 4 weeks (around 05/06/2019).   Orders:  No orders of the defined types were placed in this encounter.  No orders of the defined types were placed in this encounter.   Imaging: No new imaging  PMFS History: Patient Active Problem List   Diagnosis Date Noted  . Symptomatic anemia 03/27/2019  . Primary osteoarthritis of left knee 03/24/2019  . Status post total left knee replacement 03/24/2019   Past Medical History:  Diagnosis Date  . Arthritis   . Back pain   . Environmental allergies   . Knee problem   . Sleep apnea    rarely uses his CPAP    Family  History  Problem Relation Age of Onset  . Congestive Heart Failure Mother   . Lung cancer Father        He was a smoker.  . Congestive Heart Failure Sister   . Colon cancer Brother   . Congestive Heart Failure Sister     Past Surgical History:  Procedure Laterality Date  . APPENDECTOMY    . CHONDROPLASTY Right 11/27/2018   Procedure: CHONDROPLASTY;  Surgeon: Leandrew Koyanagi, MD;  Location: [place];  Service: Orthopedics;  Laterality: Right;  . KNEE ARTHROSCOPY Right 11/27/2018   Procedure: RIGHT KNEE ARTHROSCOPY WITH MAJOR SYNOVECTOMY AND CHONDROPLASTY MEDIAL FEMORAL CONDYLE AND TROCHLEA;  Surgeon: Leandrew Koyanagi, MD;  Location: [place];  Service: Orthopedics;  Laterality: Right;  . KNEE SURGERY Left    Multiple Knee surgeries   . SYNOVECTOMY Right 11/27/2018   Procedure: SYNOVECTOMY;  Surgeon: Leandrew Koyanagi, MD;  Location: [place];  Service: Orthopedics;  Laterality: Right;  . TOTAL KNEE ARTHROPLASTY Left 03/24/2019  . TOTAL KNEE ARTHROPLASTY Left 03/24/2019   Procedure: LEFT TOTAL KNEE ARTHROPLASTY;  Surgeon: Leandrew Koyanagi, MD;  Location: [facility] [place];  Service: Orthopedics;  Laterality: Left;  . WISDOM TOOTH EXTRACTION     Social History   Occupational History  . Not on file  Tobacco Use  .  Smoking status: Never Smoker  . Smokeless tobacco: Never Used  Substance and Sexual Activity  . Alcohol use: Yes    Comment: socially  . Drug use: No  . Sexual activity: Not Currently

## 2019-04-08 NOTE — Telephone Encounter (Signed)
Faxed Sonia Side ([facility]) order to extend HHPT for 4 weeks or until [place] approves for out patient PT.    Also [facility]ed [facility] form to approve  For outpatient PT- Pending Approval.

## 2019-04-15 ENCOUNTER — Inpatient Hospital Stay: Payer: No Typology Code available for payment source | Admitting: Physician Assistant

## 2019-04-21 ENCOUNTER — Ambulatory Visit (INDEPENDENT_AMBULATORY_CARE_PROVIDER_SITE_OTHER): Payer: No Typology Code available for payment source

## 2019-04-21 DIAGNOSIS — J309 Allergic rhinitis, unspecified: Secondary | ICD-10-CM

## 2019-05-06 ENCOUNTER — Other Ambulatory Visit: Payer: Self-pay

## 2019-05-06 ENCOUNTER — Encounter: Payer: Self-pay | Admitting: Orthopaedic Surgery

## 2019-05-06 ENCOUNTER — Ambulatory Visit (INDEPENDENT_AMBULATORY_CARE_PROVIDER_SITE_OTHER): Payer: No Typology Code available for payment source | Admitting: Orthopaedic Surgery

## 2019-05-06 ENCOUNTER — Ambulatory Visit: Payer: Self-pay

## 2019-05-06 VITALS — Ht 76.0 in | Wt 258.0 lb

## 2019-05-06 DIAGNOSIS — Z96652 Presence of left artificial knee joint: Secondary | ICD-10-CM | POA: Diagnosis not present

## 2019-05-06 NOTE — Progress Notes (Signed)
Post-Op Visit Note   Patient: Craig Lopez           Date of Birth: 1961-02-24           MRN: [id number] Visit Date: 05/06/2019 PCP: Charolett Bumpers, PA-C   Assessment & Plan:  Chief Complaint:  Chief Complaint  Patient presents with  . Left Knee - Follow-up    Left TKA DOS 03/24/2019   Visit Diagnoses:  1. S/P TKR (total knee replacement), left     Plan: Patient is a pleasant 59 year old gentleman who presents our clinic today 6 weeks status post left total knee replacement 03/24/2019.  He has been doing well.  He has been noticing "clicking "sensation which is somewhat painful at times.  He has still not been approved for physical therapy by the [place].  He has however been working on a home exercise program and has regained quite a bit of range of motion.  Examination of his left knee reveals a fully healed surgical scar without complication.  Range of motion 0 to 125 degrees.  He is stable valgus varus stress and this is where he feels a clicking sensation.  Calf is soft and nontender.  He is neurovascular intact distally.  At this point, he will continue with his range of motion strengthening exercises.  Follow-up with [place] in 6 weeks time for recheck.  Dental prophylaxis reinforced.  Call with concerns or questions.  Follow-Up Instructions: Return in about 6 weeks (around 06/17/2019).   Orders:  Orders Placed This Encounter  Procedures  . XR Knee 1-2 Views Left   No orders of the defined types were placed in this encounter.   Imaging: XR Knee 1-2 Views Left  Result Date: 05/06/2019 Well-seated prosthesis without complication   PMFS History: Patient Active Problem List   Diagnosis Date Noted  . Symptomatic anemia 03/27/2019  . Primary osteoarthritis of left knee 03/24/2019  . Status post total left knee replacement 03/24/2019   Past Medical History:  Diagnosis Date  . Arthritis   . Back pain   . Environmental allergies   . Knee problem   . Sleep apnea    rarely  uses his CPAP    Family History  Problem Relation Age of Onset  . Congestive Heart Failure Mother   . Lung cancer Father        He was a smoker.  . Congestive Heart Failure Sister   . Colon cancer Brother   . Congestive Heart Failure Sister     Past Surgical History:  Procedure Laterality Date  . APPENDECTOMY    . CHONDROPLASTY Right 11/27/2018   Procedure: CHONDROPLASTY;  Surgeon: Tarry Kos, MD;  Location: [facility];  Service: Orthopedics;  Laterality: Right;  . KNEE ARTHROSCOPY Right 11/27/2018   Procedure: RIGHT KNEE ARTHROSCOPY WITH MAJOR SYNOVECTOMY AND CHONDROPLASTY MEDIAL FEMORAL CONDYLE AND TROCHLEA;  Surgeon: Tarry Kos, MD;  Location: [facility];  Service: Orthopedics;  Laterality: Right;  . KNEE SURGERY Left    Multiple Knee surgeries   . SYNOVECTOMY Right 11/27/2018   Procedure: SYNOVECTOMY;  Surgeon: Tarry Kos, MD;  Location: [facility];  Service: Orthopedics;  Laterality: Right;  . TOTAL KNEE ARTHROPLASTY Left 03/24/2019  . TOTAL KNEE ARTHROPLASTY Left 03/24/2019   Procedure: LEFT TOTAL KNEE ARTHROPLASTY;  Surgeon: Tarry Kos, MD;  Location: [facility];  Service: Orthopedics;  Laterality: Left;  . WISDOM TOOTH EXTRACTION     Social History  Occupational History  . Not on file  Tobacco Use  . Smoking status: Never Smoker  . Smokeless tobacco: Never Used  Substance and Sexual Activity  . Alcohol use: Yes    Comment: socially  . Drug use: No  . Sexual activity: Not Currently

## 2019-05-22 ENCOUNTER — Ambulatory Visit (INDEPENDENT_AMBULATORY_CARE_PROVIDER_SITE_OTHER): Payer: No Typology Code available for payment source

## 2019-05-22 DIAGNOSIS — J309 Allergic rhinitis, unspecified: Secondary | ICD-10-CM | POA: Diagnosis not present

## 2019-05-28 ENCOUNTER — Ambulatory Visit: Payer: No Typology Code available for payment source

## 2019-05-28 ENCOUNTER — Ambulatory Visit: Payer: No Typology Code available for payment source | Attending: Internal Medicine

## 2019-05-28 DIAGNOSIS — Z20822 Contact with and (suspected) exposure to covid-19: Secondary | ICD-10-CM

## 2019-05-29 LAB — NOVEL CORONAVIRUS, NAA: SARS-CoV-2, NAA: NOT DETECTED

## 2019-06-11 DIAGNOSIS — J301 Allergic rhinitis due to pollen: Secondary | ICD-10-CM | POA: Diagnosis not present

## 2019-06-11 NOTE — Progress Notes (Signed)
Vials exp 06-10-20 

## 2019-06-12 DIAGNOSIS — J3089 Other allergic rhinitis: Secondary | ICD-10-CM | POA: Diagnosis not present

## 2019-06-23 ENCOUNTER — Ambulatory Visit (INDEPENDENT_AMBULATORY_CARE_PROVIDER_SITE_OTHER): Payer: No Typology Code available for payment source

## 2019-06-23 DIAGNOSIS — J309 Allergic rhinitis, unspecified: Secondary | ICD-10-CM | POA: Diagnosis not present

## 2019-07-22 ENCOUNTER — Ambulatory Visit (INDEPENDENT_AMBULATORY_CARE_PROVIDER_SITE_OTHER): Payer: No Typology Code available for payment source

## 2019-07-22 DIAGNOSIS — J309 Allergic rhinitis, unspecified: Secondary | ICD-10-CM | POA: Diagnosis not present

## 2019-08-01 ENCOUNTER — Other Ambulatory Visit: Payer: Self-pay | Admitting: Physician Assistant

## 2019-08-01 ENCOUNTER — Telehealth: Payer: Self-pay | Admitting: Orthopaedic Surgery

## 2019-08-01 MED ORDER — AMOXICILLIN 500 MG PO TABS: ORAL_TABLET | ORAL | 2 refills | Status: AC

## 2019-08-01 NOTE — Telephone Encounter (Signed)
Patient called.   He needs antibiotics prescribed to him for his upcoming dental appointment.  Call back: [phone]

## 2019-08-01 NOTE — Telephone Encounter (Signed)
Sent in

## 2019-08-15 ENCOUNTER — Ambulatory Visit: Payer: Self-pay

## 2019-08-15 DIAGNOSIS — J309 Allergic rhinitis, unspecified: Secondary | ICD-10-CM

## 2019-08-22 ENCOUNTER — Ambulatory Visit (INDEPENDENT_AMBULATORY_CARE_PROVIDER_SITE_OTHER): Payer: No Typology Code available for payment source

## 2019-08-22 DIAGNOSIS — J309 Allergic rhinitis, unspecified: Secondary | ICD-10-CM

## 2019-09-01 ENCOUNTER — Ambulatory Visit (INDEPENDENT_AMBULATORY_CARE_PROVIDER_SITE_OTHER): Payer: No Typology Code available for payment source

## 2019-09-01 DIAGNOSIS — J309 Allergic rhinitis, unspecified: Secondary | ICD-10-CM

## 2019-09-09 ENCOUNTER — Ambulatory Visit (INDEPENDENT_AMBULATORY_CARE_PROVIDER_SITE_OTHER): Payer: No Typology Code available for payment source

## 2019-09-09 DIAGNOSIS — J309 Allergic rhinitis, unspecified: Secondary | ICD-10-CM

## 2019-09-16 ENCOUNTER — Ambulatory Visit (INDEPENDENT_AMBULATORY_CARE_PROVIDER_SITE_OTHER): Payer: No Typology Code available for payment source

## 2019-09-16 DIAGNOSIS — J309 Allergic rhinitis, unspecified: Secondary | ICD-10-CM

## 2019-09-23 ENCOUNTER — Ambulatory Visit (INDEPENDENT_AMBULATORY_CARE_PROVIDER_SITE_OTHER): Payer: No Typology Code available for payment source

## 2019-09-23 DIAGNOSIS — J309 Allergic rhinitis, unspecified: Secondary | ICD-10-CM

## 2019-10-10 ENCOUNTER — Ambulatory Visit (INDEPENDENT_AMBULATORY_CARE_PROVIDER_SITE_OTHER): Payer: No Typology Code available for payment source

## 2019-10-10 ENCOUNTER — Ambulatory Visit (INDEPENDENT_AMBULATORY_CARE_PROVIDER_SITE_OTHER): Payer: No Typology Code available for payment source | Admitting: Orthopaedic Surgery

## 2019-10-10 ENCOUNTER — Encounter: Payer: Self-pay | Admitting: Orthopaedic Surgery

## 2019-10-10 ENCOUNTER — Other Ambulatory Visit: Payer: Self-pay

## 2019-10-10 VITALS — Ht 73.0 in | Wt 288.8 lb

## 2019-10-10 DIAGNOSIS — Z96652 Presence of left artificial knee joint: Secondary | ICD-10-CM

## 2019-10-10 DIAGNOSIS — M1711 Unilateral primary osteoarthritis, right knee: Secondary | ICD-10-CM | POA: Diagnosis not present

## 2019-10-10 NOTE — Progress Notes (Signed)
Office Visit Note   Patient: Craig Lopez           Date of Birth: 01-Jul-1960           MRN: [id number] Visit Date: 10/10/2019              Requested by: Merry Proud, PA-C [place],  [place] [address] PCP: Merry Proud, PA-C   Assessment & Plan: Visit Diagnoses:  1. S/P TKR (total knee replacement), left   2. Primary osteoarthritis of right knee     Plan: Impression is 6 months status post left total knee replacement and doing well.  Implant is stable and without complication.  Dental prophylaxis reinforced.  Recheck in 6 months with two-view x-rays of the left knee.  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  Overall his pain is not severe enough to undergo any more surgeries at this point.  We talked about the importance of weight loss and strengthening to help reduce knee pain.  We have written a prescription for physical therapy.  Questions encouraged and answered.  Follow-up as needed.  Follow-Up Instructions: Return in about 6 months (around 04/10/2020).   Orders:  Orders Placed This Encounter  Procedures  . XR Knee 1-2 Views Left   No orders of the defined types were placed in this encounter.     Procedures: No procedures performed   Clinical Data: No additional findings.   Subjective: Chief Complaint  Patient presents with  . Right Knee - Pain    Craig Lopez is a 7-month status post left total knee replacement.  Overall doing well.  No real complaints.  His right knee has been bothering him.  He has had popping and pain especially with pivoting.  He had MRI 3 weeks ago at the [place] which is not available for review today.  He takes oxycodone.   Review of Systems  Constitutional: Negative.   All other systems reviewed and are negative.    Objective: Vital Signs: Ht 6\' 1"  (1.854 m)   Wt 288 lb 12.8 oz (131  kg)   BMI 38.10 kg/m   Physical Exam Vitals and nursing note reviewed.  Constitutional:      Appearance: He is well-developed.  Pulmonary:     Effort: Pulmonary effort is normal.  Abdominal:     Palpations: Abdomen is soft.  Skin:    General: Skin is warm.  Neurological:     Mental Status: He is alert and oriented to person, place, and time.  Psychiatric:        Behavior: Behavior normal.        Thought Content: Thought content normal.        Judgment: Judgment normal.     Ortho Exam Left knee shows a fully healed surgical scar.  Minimal swelling.  Normal range of motion without pain. Right knee shows 1+ patellofemoral crepitus.  Collaterals and cruciates are stable.  Trace joint effusion. Specialty Comments:  No specialty comments available.  Imaging: XR Knee 1-2 Views Left  Result Date: 10/10/2019 Stable total knee replacement in good alignment.     PMFS History: Patient Active Problem List   Diagnosis Date Noted  . Symptomatic anemia 03/27/2019  . Primary osteoarthritis of left knee 03/24/2019  . Status post total left knee replacement 03/24/2019   Past Medical History:  Diagnosis Date  .  Arthritis   . Back pain   . Environmental allergies   . Knee problem   . Sleep apnea    rarely uses his CPAP    Family History  Problem Relation Age of Onset  . Congestive Heart Failure Mother   . Lung cancer Father        He was a smoker.  . Congestive Heart Failure Sister   . Colon cancer Brother   . Congestive Heart Failure Sister     Past Surgical History:  Procedure Laterality Date  . APPENDECTOMY    . CHONDROPLASTY Right 11/27/2018   Procedure: CHONDROPLASTY;  Surgeon: Tarry Kos, MD;  Location: [facility];  Service: Orthopedics;  Laterality: Right;  . KNEE ARTHROSCOPY Right 11/27/2018   Procedure: RIGHT KNEE ARTHROSCOPY WITH MAJOR SYNOVECTOMY AND CHONDROPLASTY MEDIAL FEMORAL CONDYLE AND TROCHLEA;  Surgeon: Tarry Kos, MD;  Location:  [facility];  Service: Orthopedics;  Laterality: Right;  . KNEE SURGERY Left    Multiple Knee surgeries   . SYNOVECTOMY Right 11/27/2018   Procedure: SYNOVECTOMY;  Surgeon: Tarry Kos, MD;  Location: [facility];  Service: Orthopedics;  Laterality: Right;  . TOTAL KNEE ARTHROPLASTY Left 03/24/2019  . TOTAL KNEE ARTHROPLASTY Left 03/24/2019   Procedure: LEFT TOTAL KNEE ARTHROPLASTY;  Surgeon: Tarry Kos, MD;  Location: [facility];  Service: Orthopedics;  Laterality: Left;  . WISDOM TOOTH EXTRACTION     Social History   Occupational History  . Not on file  Tobacco Use  . Smoking status: Never Smoker  . Smokeless tobacco: Never Used  Vaping Use  . Vaping Use: Never used  Substance and Sexual Activity  . Alcohol use: Yes    Comment: socially  . Drug use: No  . Sexual activity: Not Currently

## 2019-10-23 ENCOUNTER — Ambulatory Visit (INDEPENDENT_AMBULATORY_CARE_PROVIDER_SITE_OTHER): Payer: No Typology Code available for payment source

## 2019-10-23 ENCOUNTER — Telehealth: Payer: Self-pay | Admitting: Orthopaedic Surgery

## 2019-10-23 DIAGNOSIS — J309 Allergic rhinitis, unspecified: Secondary | ICD-10-CM

## 2019-10-23 NOTE — Telephone Encounter (Signed)
Cherie from the [facility] called.   They need the appointment notes from the patient's last visit faxed over.   Fax: [phone]

## 2019-10-24 NOTE — Telephone Encounter (Signed)
faxed

## 2019-10-30 ENCOUNTER — Other Ambulatory Visit: Payer: Self-pay

## 2019-10-30 ENCOUNTER — Emergency Department (HOSPITAL_COMMUNITY)
Admission: EM | Admit: 2019-10-30 | Discharge: 2019-10-30 | Disposition: A | Discharge status: Home / Self Care | Payer: No Typology Code available for payment source | Attending: Emergency Medicine | Admitting: Emergency Medicine

## 2019-10-30 ENCOUNTER — Emergency Department (HOSPITAL_COMMUNITY): Payer: No Typology Code available for payment source

## 2019-10-30 DIAGNOSIS — M545 Low back pain, unspecified: Secondary | ICD-10-CM

## 2019-10-30 DIAGNOSIS — Y990 Civilian activity done for income or pay: Secondary | ICD-10-CM | POA: Diagnosis not present

## 2019-10-30 DIAGNOSIS — Z7982 Long term (current) use of aspirin: Secondary | ICD-10-CM | POA: Insufficient documentation

## 2019-10-30 DIAGNOSIS — W11XXXA Fall on and from ladder, initial encounter: Secondary | ICD-10-CM | POA: Insufficient documentation

## 2019-10-30 NOTE — Discharge Instructions (Signed)
You can take Tylenol or Ibuprofen as directed for pain. You can alternate Tylenol and Ibuprofen every 4 hours. If you take Tylenol at 1pm, then you can take Ibuprofen at 5pm. Then you can take Tylenol again at 9pm.   Apply heat to help with pain.  You can take muscle relaxers that you are previously prescribed for pain.  Return to the Emergency Department immediately for any worsening back pain, neck pain, difficulty walking, numbness/weaknss of your arms or legs, urinary or bowel accidents, fever or any other worsening or concerning symptoms.

## 2019-10-30 NOTE — ED Triage Notes (Signed)
Patient reports he fell from a ladder approximately 5 ft. Patient reports he has been taking naproxen without relief and reports lower back pain. Patient denies chest pain or sob. Denies urinating blood or seeing any blood.  Patient reports the fall happened x2 days ago

## 2019-10-30 NOTE — ED Provider Notes (Signed)
[facility]-EMERGENCY DEPT Provider Note   CSN: [id number] Arrival date & time: 10/30/19  [id number]     History Chief Complaint  Patient presents with  . Fall  . Back Pain    Craig Lopez is a 59 y.o. male past medical history of back pain, arthritis who presents for evaluation of lower back pain has been ongoing for the last 3 days after mechanical fall.  He reports that he was on a ladder working on an elevator when the ladder fell and he fell off of it, landing on his back and buttock.  He states that he did land primarily on his left side.  He estimates that he was about 5 feet in the air.  No head injury, loss of consciousness.  Since then, he has had pain to the lower back.  He states it is particularly worse on the left side.  He has been taking naproxen with no improvement in symptoms.  His pain is worse when he attempts to change positions such as getting up from bed, standing from sitting position or bending.  He has been able to ambulate but does report worsening pain with ambulation.  No prior history of back surgery.  He denies any chest pain, difficulty breathing, dysuria, hematuria, numbness/weakness of his arms or legs.  The history is provided by the patient.       Past Medical History:  Diagnosis Date  . Arthritis   . Back pain   . Environmental allergies   . Knee problem   . Sleep apnea    rarely uses his CPAP    Patient Active Problem List   Diagnosis Date Noted  . Symptomatic anemia 03/27/2019  . Primary osteoarthritis of left knee 03/24/2019  . Status post total left knee replacement 03/24/2019    Past Surgical History:  Procedure Laterality Date  . APPENDECTOMY    . CHONDROPLASTY Right 11/27/2018   Procedure: CHONDROPLASTY;  Surgeon: Tarry KosXu, Naiping M, MD;  Location: [facility];  Service: Orthopedics;  Laterality: Right;  . KNEE ARTHROSCOPY Right 11/27/2018   Procedure: RIGHT KNEE ARTHROSCOPY WITH MAJOR SYNOVECTOMY AND  CHONDROPLASTY MEDIAL FEMORAL CONDYLE AND TROCHLEA;  Surgeon: Tarry KosXu, Naiping M, MD;  Location: [facility];  Service: Orthopedics;  Laterality: Right;  . KNEE SURGERY Left    Multiple Knee surgeries   . SYNOVECTOMY Right 11/27/2018   Procedure: SYNOVECTOMY;  Surgeon: Tarry KosXu, Naiping M, MD;  Location: [facility];  Service: Orthopedics;  Laterality: Right;  . TOTAL KNEE ARTHROPLASTY Left 03/24/2019  . TOTAL KNEE ARTHROPLASTY Left 03/24/2019   Procedure: LEFT TOTAL KNEE ARTHROPLASTY;  Surgeon: Tarry KosXu, Naiping M, MD;  Location: [facility];  Service: Orthopedics;  Laterality: Left;  . WISDOM TOOTH EXTRACTION         Family History  Problem Relation Age of Onset  . Congestive Heart Failure Mother   . Lung cancer Father        He was a smoker.  . Congestive Heart Failure Sister   . Colon cancer Brother   . Congestive Heart Failure Sister     Social History   Tobacco Use  . Smoking status: Never Smoker  . Smokeless tobacco: Never Used  Vaping Use  . Vaping Use: Never used  Substance Use Topics  . Alcohol use: Yes    Comment: socially  . Drug use: No    Home Medications Prior to Admission medications   Medication Sig Start Date End Date Taking?  Authorizing Provider  amoxicillin (AMOXIL) 500 MG tablet Take 4 tabs one hour prior to dental work 08/01/19   Cristie Hem, PA-C  aspirin EC 81 MG tablet Take 1 tablet (81 mg total) by mouth 2 (two) times daily. 03/24/19   Tarry Kos, MD  Bepotastine Besilate (BEPREVE) 1.5 % SOLN Can use one drop in each eye twice daily if needed for itchy eyes. Patient taking differently: Place 1 drop into both eyes 2 (two) times daily as needed (itchy eyes. (Spring/Fall ONLY)). Can use one drop in each eye twice daily if needed for itchy eyes. 08/03/15   Kozlow, Alvira Philips, MD  celecoxib (CELEBREX) 200 MG capsule Take 1 capsule (200 mg total) by mouth 2 (two) times daily. 04/03/19   Tarry Kos, MD  EPINEPHrine 0.3 mg/0.3 mL IJ SOAJ injection  Use as directed for life-threatening allergic reaction. Patient taking differently: Inject 0.3 mg into the muscle as needed for anaphylaxis. Use as directed for life-threatening allergic reaction. 08/03/15   Kozlow, Alvira Philips, MD  ferrous sulfate 325 (65 FE) MG tablet Take 1 tablet (325 mg total) by mouth daily. 03/29/19 04/28/19  Albertha Ghee, MD  ketorolac (TORADOL) 10 MG tablet Take 1 tablet (10 mg total) by mouth 2 (two) times daily as needed. 03/24/19   Tarry Kos, MD  lidocaine (LIDODERM) 5 % Place 1 patch onto the skin daily as needed (back pain.). Remove & Discard patch within 12 hours or as directed by MD    [provider]  LUBRICATING PLUS EYE DROPS 0.5 % SOLN Place 1-2 drops into both eyes 3 (three) times daily as needed for dry eyes. 10/28/18   [provider]  methocarbamol (ROBAXIN) 750 MG tablet Take 1 tablet (750 mg total) by mouth 2 (two) times daily as needed for muscle spasms. 03/24/19   Tarry Kos, MD  ondansetron (ZOFRAN) 4 MG tablet Take 1-2 tablets (4-8 mg total) by mouth every 8 (eight) hours as needed for nausea or vomiting. 03/24/19   Tarry Kos, MD  oxyCODONE (OXY IR/ROXICODONE) 5 MG immediate release tablet Take 5 mg by mouth every 4 (four) hours as needed (pain.).    [provider]  oxyCODONE (OXY IR/ROXICODONE) 5 MG immediate release tablet Take 1-2 tablets (5-10 mg total) by mouth every 8 (eight) hours as needed for severe pain. 03/24/19   Tarry Kos, MD  QUEtiapine (SEROQUEL) 100 MG tablet Take 300 mg by mouth at bedtime.    [provider]  sildenafil (VIAGRA) 100 MG tablet Take 100 mg by mouth daily as needed for erectile dysfunction. 12/30/18   [provider]  sulfamethoxazole-trimethoprim (BACTRIM DS) 800-160 MG tablet Take 1 tablet by mouth 2 (two) times daily. 03/24/19   Tarry Kos, MD  venlafaxine XR (EFFEXOR-XR) 75 MG 24 hr capsule Take 225 mg by mouth daily. 12/26/18   [provider]    Allergies     Patient has no known allergies.  Review of Systems   Review of Systems  Respiratory: Negative for shortness of breath.   Cardiovascular: Negative for chest pain.  Genitourinary: Negative for dysuria and hematuria.  Musculoskeletal: Positive for back pain.  Neurological: Negative for weakness, numbness and headaches.  All other systems reviewed and are negative.   Physical Exam Updated Vital Signs BP (!) 129/99 (BP Location: Right Arm)   Pulse 90   Temp 98.2 F (36.8 C) (Oral)   Resp 18   SpO2 98%   Physical  Exam Vitals and nursing note reviewed.  Constitutional:      Appearance: He is well-developed.  HENT:     Head: Normocephalic and atraumatic.  Eyes:     General: No scleral icterus.       Right eye: No discharge.        Left eye: No discharge.     Conjunctiva/sclera: Conjunctivae normal.  Neck:     Comments: Full flexion/extension and lateral movement of neck fully intact. No bony midline tenderness. No deformities or crepitus.  Pulmonary:     Effort: Pulmonary effort is normal.  Musculoskeletal:       Back:     Comments: No midline T-spine tenderness.  No deformity or crepitus noted.  Tenderness palpation in the midline lumbar region that extends into the left paraspinal muscles of the lumbar region.  No deformity or crepitus noted.  Skin:    General: Skin is warm and dry.     Comments: No bruising/ecchymosis/hematoma noted to lower back.  Neurological:     Mental Status: He is alert.     Comments: Follows commands, Moves all extremities  5/5 strength to BUE and BLE  Sensation intact throughout all major nerve distributions  Psychiatric:        Speech: Speech normal.        Behavior: Behavior normal.     ED Results / Procedures / Treatments   Labs (all labs ordered are listed, but only abnormal results are displayed) Labs Reviewed - No data to display  EKG None  Radiology DG Lumbar Spine Complete  Result Date: 10/30/2019 CLINICAL DATA:  Low back  pain with fall from ladder EXAM: LUMBAR SPINE - COMPLETE 4+ VIEW COMPARISON:  None. FINDINGS: Frontal, lateral, spot lumbosacral lateral, and bilateral oblique views were obtained. There are 5 non-rib-bearing lumbar type vertebral bodies. There is no fracture or spondylolisthesis. The disc spaces appear unremarkable. There is no appreciable facet arthropathy. IMPRESSION: No fracture or spondylolisthesis.  No appreciable arthropathy. Electronically Signed   By: Bretta Bang III M.D.   On: 10/30/2019 10:41   DG Sacrum/Coccyx  Result Date: 10/30/2019 CLINICAL DATA:  Pain following fall from ladder EXAM: SACRUM AND COCCYX - 2+ VIEW COMPARISON:  None. FINDINGS: Frontal, angled frontal, and lateral views were obtained. There is no evident fracture or diastasis. Joint spaces appear normal. No erosive change. IMPRESSION: No fracture or diastasis.  No appreciable arthropathy. Electronically Signed   By: Bretta Bang III M.D.   On: 10/30/2019 10:42    Procedures Procedures (including critical care time)  Medications Ordered in ED Medications - No data to display  ED Course  I have reviewed the triage vital signs and the nursing notes.  Pertinent labs & imaging results that were available during my care of the patient were reviewed by me and considered in my medical decision making (see chart for details).    MDM Rules/Calculators/A&P                          59 year old male who presents for evaluation of lower back pain status post mechanical fall that occurred few days ago.  Estimates that he fell about 5 feet from a ladder.  No head injury, LOC.  On initially arrival, he is afebrile, nontoxic-appearing.  Vital signs are stable.  No neuro deficits noted on exam.  Concern for musculoskeletal etiology, particularly since that it is worse with moving, bending.  Also consider fracture given history of trauma.  Plan for x-ray imaging.  Lumbar XR negative for any acute bony abnormality.   Sacrum/coccyx x-ray negative for any acute bony abnormality.  Discussed results with patient.  We will plan to treat as musculoskeletal.  Discussed with patient utilizing muscle relaxers for control of his pain.  He reports he already has muscle relaxers at home.  He has not tried that yet.  He does not wish to have any prescription at this time.  Patient is ambulatory with no signs of neuro deficits. At this time, patient exhibits no emergent life-threatening condition that require further evaluation in ED or admission. Patient had ample opportunity for questions and discussion. All patient's questions were answered with full understanding. Strict return precautions discussed. Patient expresses understanding and agreement to plan.   Portions of this note were generated with Scientist, clinical (histocompatibility and immunogenetics). Dictation errors may occur despite best attempts at proofreading.  Final Clinical Impression(s) / ED Diagnoses Final diagnoses:  Acute left-sided low back pain, unspecified whether sciatica present    Rx / DC Orders ED Discharge Orders    None       Rosana Hoes 10/30/19 1350    Arby Barrette, MD 10/31/19 1211

## 2019-11-14 ENCOUNTER — Ambulatory Visit (INDEPENDENT_AMBULATORY_CARE_PROVIDER_SITE_OTHER): Payer: No Typology Code available for payment source

## 2019-11-14 DIAGNOSIS — J309 Allergic rhinitis, unspecified: Secondary | ICD-10-CM

## 2019-11-25 ENCOUNTER — Telehealth: Payer: Self-pay | Admitting: Allergy and Immunology

## 2019-11-25 NOTE — Telephone Encounter (Signed)
New request for service faxed to [facility] for new [place] authorization 11/25/2019.

## 2019-12-01 NOTE — Telephone Encounter (Signed)
[facility] sent over extended [place] referral. Authorization expires 02/20/2020.

## 2019-12-23 ENCOUNTER — Ambulatory Visit: Payer: No Typology Code available for payment source | Admitting: Allergy and Immunology

## 2019-12-24 ENCOUNTER — Ambulatory Visit (INDEPENDENT_AMBULATORY_CARE_PROVIDER_SITE_OTHER): Payer: No Typology Code available for payment source

## 2019-12-24 DIAGNOSIS — J309 Allergic rhinitis, unspecified: Secondary | ICD-10-CM

## 2020-01-06 ENCOUNTER — Ambulatory Visit: Payer: No Typology Code available for payment source | Admitting: Allergy and Immunology

## 2020-01-20 ENCOUNTER — Ambulatory Visit (INDEPENDENT_AMBULATORY_CARE_PROVIDER_SITE_OTHER): Payer: No Typology Code available for payment source | Admitting: *Deleted

## 2020-01-20 DIAGNOSIS — J309 Allergic rhinitis, unspecified: Secondary | ICD-10-CM | POA: Diagnosis not present

## 2020-01-21 DIAGNOSIS — J301 Allergic rhinitis due to pollen: Secondary | ICD-10-CM | POA: Diagnosis not present

## 2020-01-21 NOTE — Progress Notes (Signed)
VIALS EXP 01-20-21 

## 2020-01-22 DIAGNOSIS — J3089 Other allergic rhinitis: Secondary | ICD-10-CM | POA: Diagnosis not present

## 2020-02-10 ENCOUNTER — Ambulatory Visit: Payer: Self-pay

## 2020-02-10 ENCOUNTER — Encounter: Payer: Self-pay | Admitting: Allergy and Immunology

## 2020-02-10 ENCOUNTER — Ambulatory Visit (INDEPENDENT_AMBULATORY_CARE_PROVIDER_SITE_OTHER): Payer: No Typology Code available for payment source | Admitting: Allergy and Immunology

## 2020-02-10 ENCOUNTER — Other Ambulatory Visit: Payer: Self-pay

## 2020-02-10 VITALS — BP 126/82 | HR 75 | Temp 98.2°F | Resp 16 | Ht 73.0 in | Wt 288.8 lb

## 2020-02-10 DIAGNOSIS — J309 Allergic rhinitis, unspecified: Secondary | ICD-10-CM

## 2020-02-10 DIAGNOSIS — J3089 Other allergic rhinitis: Secondary | ICD-10-CM | POA: Diagnosis not present

## 2020-02-10 NOTE — Progress Notes (Signed)
[facility]   Follow-up Note  Referring Provider: Charolett Bumpers, PA-C Primary Provider: Lynne Leader Date of Office Visit: 02/10/2020  Subjective:   Craig Lopez (DOB: 02/17/1961) is a 59 y.o. male who [facility] in re-evaluation of the following:  HPI: Craig Lopez returns to this clinic in reevaluation of allergic rhinoconjunctivitis treated with immunotherapy.  His last visit to this clinic was 24 December 2018.  He continues to use immunotherapy currently at every 4 weeks without any adverse effect.  This form of therapy has resulted in complete control of his respiratory tract symptoms and he does not use any other therapy for his respiratory tract at this point in time.  He has received 3 Pfizer Covid vaccinations and has received the flu vaccine.  Allergies as of 02/10/2020   No Known Allergies     Medication List      amoxicillin 500 MG tablet Commonly known as: AMOXIL Take 4 tabs one hour prior to dental work   aspirin EC 81 MG tablet Take 1 tablet (81 mg total) by mouth 2 (two) times daily.   Bepotastine Besilate 1.5 % Soln Commonly known as: Bepreve Can use one drop in each eye twice daily if needed for itchy eyes.   celecoxib 200 MG capsule Commonly known as: CELEBREX Take 1 capsule (200 mg total) by mouth 2 (two) times daily.   EPINEPHrine 0.3 mg/0.3 mL Soaj injection Commonly known as: EPI-PEN Use as directed for life-threatening allergic reaction.   ferrous sulfate 325 (65 FE) MG tablet Take 1 tablet (325 mg total) by mouth daily.   ketorolac 10 MG tablet Commonly known as: TORADOL Take 1 tablet (10 mg total) by mouth 2 (two) times daily as needed.   lidocaine 5 % Commonly known as: LIDODERM Place 1 patch onto the skin daily as needed (back pain.). Remove & Discard patch within 12 hours or as directed by MD   Lubricating Plus Eye Drops 0.5 %  Soln Generic drug: Carboxymethylcellulose Sod PF Place 1-2 drops into both eyes 3 (three) times daily as needed for dry eyes.   methocarbamol 750 MG tablet Commonly known as: ROBAXIN Take 1 tablet (750 mg total) by mouth 2 (two) times daily as needed for muscle spasms.   ondansetron 4 MG tablet Commonly known as: ZOFRAN Take 1-2 tablets (4-8 mg total) by mouth every 8 (eight) hours as needed for nausea or vomiting.   oxyCODONE 5 MG immediate release tablet Commonly known as: Oxy IR/ROXICODONE Take 5 mg by mouth every 4 (four) hours as needed (pain.).   oxyCODONE 5 MG immediate release tablet Commonly known as: Oxy IR/ROXICODONE Take 1-2 tablets (5-10 mg total) by mouth every 8 (eight) hours as needed for severe pain.   QUEtiapine 100 MG tablet Commonly known as: SEROQUEL Take 300 mg by mouth at bedtime.   sildenafil 100 MG tablet Commonly known as: VIAGRA Take 100 mg by mouth daily as needed for erectile dysfunction.   sulfamethoxazole-trimethoprim 800-160 MG tablet Commonly known as: BACTRIM DS Take 1 tablet by mouth 2 (two) times daily.   venlafaxine XR 75 MG 24 hr capsule Commonly known as: EFFEXOR-XR Take 225 mg by mouth daily.       Past Medical History:  Diagnosis Date  . Arthritis   . Back pain   . Environmental allergies   . Knee problem   . Sleep apnea  rarely uses his CPAP    Past Surgical History:  Procedure Laterality Date  . APPENDECTOMY    . CHONDROPLASTY Right 11/27/2018   Procedure: CHONDROPLASTY;  Surgeon: Tarry Kos, MD;  Location: [facility];  Service: Orthopedics;  Laterality: Right;  . KNEE ARTHROSCOPY Right 11/27/2018   Procedure: RIGHT KNEE ARTHROSCOPY WITH MAJOR SYNOVECTOMY AND CHONDROPLASTY MEDIAL FEMORAL CONDYLE AND TROCHLEA;  Surgeon: Tarry Kos, MD;  Location: [facility];  Service: Orthopedics;  Laterality: Right;  . KNEE SURGERY Left    Multiple Knee surgeries   . SYNOVECTOMY Right 11/27/2018    Procedure: SYNOVECTOMY;  Surgeon: Tarry Kos, MD;  Location: [facility];  Service: Orthopedics;  Laterality: Right;  . TOTAL KNEE ARTHROPLASTY Left 03/24/2019  . TOTAL KNEE ARTHROPLASTY Left 03/24/2019   Procedure: LEFT TOTAL KNEE ARTHROPLASTY;  Surgeon: Tarry Kos, MD;  [facility];  Service: Orthopedics;  Laterality: Left;  . WISDOM TOOTH EXTRACTION      Review of systems negative except as noted in HPI / PMHx or noted below:  Review of Systems  Constitutional: Negative.   HENT: Negative.   Eyes: Negative.   Respiratory: Negative.   Cardiovascular: Negative.   Gastrointestinal: Negative.   Genitourinary: Negative.   Musculoskeletal: Negative.   Skin: Negative.   Neurological: Negative.   Endo/Heme/Allergies: Negative.   Psychiatric/Behavioral: Negative.      Objective:   Vitals:   02/10/20 1343  BP: 126/82  Pulse: 75  Resp: 16  Temp: 98.2 F (36.8 C)  SpO2: 99%   Height: 6\' 1"  (185.4 cm)  Weight: 288 lb 12.8 oz (131 kg)   Physical Exam Constitutional:      Appearance: He is not diaphoretic.  HENT:     Head: Normocephalic.     Right Ear: Tympanic membrane, ear canal and external ear normal.     Left Ear: Tympanic membrane, ear canal and external ear normal.     Nose: Nose normal. No mucosal edema or rhinorrhea.     Mouth/Throat:     Pharynx: Uvula midline. No oropharyngeal exudate.  Eyes:     Conjunctiva/sclera: Conjunctivae normal.  Neck:     Thyroid: No thyromegaly.     Trachea: Trachea normal. No tracheal tenderness or tracheal deviation.  Cardiovascular:     Rate and Rhythm: Normal rate and regular rhythm.     Heart sounds: Normal heart sounds, S1 normal and S2 normal. No murmur heard.   Pulmonary:     Effort: No respiratory distress.     Breath sounds: Normal breath sounds. No stridor. No wheezing or rales.  Lymphadenopathy:     Head:     Right side of head: No tonsillar adenopathy.     Left side of head: No tonsillar  adenopathy.     Cervical: No cervical adenopathy.  Skin:    Findings: No erythema or rash.     Nails: There is no clubbing.  Neurological:     Mental Status: He is alert.     Diagnostics: none  Assessment and Plan:   1. Other allergic rhinitis     1.  Continue to perform allergen avoidance measures  2.  Continue immunotherapy and EpiPen  3. Return to clinic in 12 months or earlier if problem  [place] is doing wonderful on his current therapy and we will continue to have him utilize immunotherapy and see him back in his clinic in 12 months or earlier if there is a problem.  Allena Katz, MD Allergy / Immunology [place]

## 2020-02-10 NOTE — Patient Instructions (Addendum)
  1.  Continue to perform allergen avoidance measures  2.  Continue immunotherapy and EpiPen  3. Return to clinic in 12 months or earlier if problem

## 2020-02-11 ENCOUNTER — Encounter: Payer: Self-pay | Admitting: Allergy and Immunology

## 2020-02-12 NOTE — Telephone Encounter (Signed)
New request for service faxed to [facility] for new [place] authorization. Faxed to [phone], [phone] and [phone].

## 2020-02-18 NOTE — Telephone Encounter (Signed)
Called [facility] to check on referral. [facility] states all the fax numbers were wrong, gave the fax number [phone]. [facility] page.

## 2020-03-25 ENCOUNTER — Ambulatory Visit: Payer: No Typology Code available for payment source | Admitting: Orthopaedic Surgery

## 2020-04-06 ENCOUNTER — Ambulatory Visit: Payer: No Typology Code available for payment source | Admitting: Orthopaedic Surgery

## 2020-04-06 ENCOUNTER — Other Ambulatory Visit: Payer: Self-pay

## 2020-04-06 ENCOUNTER — Ambulatory Visit: Payer: Self-pay | Admitting: *Deleted

## 2020-04-06 DIAGNOSIS — J309 Allergic rhinitis, unspecified: Secondary | ICD-10-CM

## 2020-04-14 ENCOUNTER — Ambulatory Visit: Payer: Self-pay

## 2020-04-14 DIAGNOSIS — J309 Allergic rhinitis, unspecified: Secondary | ICD-10-CM

## 2020-04-15 NOTE — Telephone Encounter (Signed)
I spoke with the [facility] today and the coordinator states the referral is in place but not finished due to them needing to speak to the vet. Im unsure why they hadn't reached out to the patient to let him know this.  Patient will need to call the PCP Curlene Dolphin. The paperwork is missing a certain code regarding COVID??? Coordinator states once the patient calls they can get everything finished.  I tried leaving a voicemail, but his mailbox is full.  I will send the patient a MyChart Message with this information.  Hopefully he can get this resolved.

## 2020-04-15 NOTE — Telephone Encounter (Signed)
Looks like patient hasn't been opening any reminders or messages that comes to his mychart. I will not try mychart for communication.   I will reach back out to him again next week. I will not be in office till Wednesday.

## 2020-04-23 NOTE — Telephone Encounter (Signed)
I spoke with the patient today and he is going to call his PCP at the [facility] right now. He said he will give [place] a call back and let us know what is going on.

## 2020-04-27 ENCOUNTER — Ambulatory Visit (INDEPENDENT_AMBULATORY_CARE_PROVIDER_SITE_OTHER): Payer: No Typology Code available for payment source

## 2020-04-27 DIAGNOSIS — J309 Allergic rhinitis, unspecified: Secondary | ICD-10-CM

## 2020-05-06 ENCOUNTER — Ambulatory Visit (INDEPENDENT_AMBULATORY_CARE_PROVIDER_SITE_OTHER): Payer: No Typology Code available for payment source

## 2020-05-06 DIAGNOSIS — J309 Allergic rhinitis, unspecified: Secondary | ICD-10-CM | POA: Diagnosis not present

## 2020-05-11 ENCOUNTER — Ambulatory Visit (INDEPENDENT_AMBULATORY_CARE_PROVIDER_SITE_OTHER): Payer: No Typology Code available for payment source

## 2020-05-11 DIAGNOSIS — J309 Allergic rhinitis, unspecified: Secondary | ICD-10-CM | POA: Diagnosis not present

## 2020-05-17 NOTE — Telephone Encounter (Addendum)
[facility], states he does have a referral dated 04-26-2020 to 04-26-2021, authorization: [id number]. Requested a fax be sent. I have put in a referral with the information I have and will finish it once I get the authorization in hand.

## 2020-05-17 NOTE — Telephone Encounter (Signed)
Any update on this?

## 2020-06-04 ENCOUNTER — Ambulatory Visit (INDEPENDENT_AMBULATORY_CARE_PROVIDER_SITE_OTHER): Payer: No Typology Code available for payment source

## 2020-06-04 DIAGNOSIS — J309 Allergic rhinitis, unspecified: Secondary | ICD-10-CM | POA: Diagnosis not present

## 2020-06-17 ENCOUNTER — Ambulatory Visit (INDEPENDENT_AMBULATORY_CARE_PROVIDER_SITE_OTHER): Payer: No Typology Code available for payment source

## 2020-06-17 DIAGNOSIS — J309 Allergic rhinitis, unspecified: Secondary | ICD-10-CM | POA: Diagnosis not present

## 2020-06-22 ENCOUNTER — Ambulatory Visit: Payer: No Typology Code available for payment source | Admitting: Orthopaedic Surgery

## 2020-06-24 ENCOUNTER — Ambulatory Visit (INDEPENDENT_AMBULATORY_CARE_PROVIDER_SITE_OTHER): Payer: No Typology Code available for payment source

## 2020-06-24 DIAGNOSIS — J309 Allergic rhinitis, unspecified: Secondary | ICD-10-CM

## 2020-07-07 ENCOUNTER — Ambulatory Visit: Payer: No Typology Code available for payment source | Admitting: Orthopaedic Surgery

## 2020-07-13 ENCOUNTER — Ambulatory Visit (INDEPENDENT_AMBULATORY_CARE_PROVIDER_SITE_OTHER): Payer: No Typology Code available for payment source | Admitting: *Deleted

## 2020-07-13 ENCOUNTER — Ambulatory Visit (INDEPENDENT_AMBULATORY_CARE_PROVIDER_SITE_OTHER): Payer: No Typology Code available for payment source | Admitting: Orthopaedic Surgery

## 2020-07-13 ENCOUNTER — Ambulatory Visit (INDEPENDENT_AMBULATORY_CARE_PROVIDER_SITE_OTHER): Payer: No Typology Code available for payment source

## 2020-07-13 ENCOUNTER — Other Ambulatory Visit: Payer: Self-pay

## 2020-07-13 DIAGNOSIS — J309 Allergic rhinitis, unspecified: Secondary | ICD-10-CM | POA: Diagnosis not present

## 2020-07-13 DIAGNOSIS — M25562 Pain in left knee: Secondary | ICD-10-CM | POA: Diagnosis not present

## 2020-07-13 DIAGNOSIS — G8929 Other chronic pain: Secondary | ICD-10-CM

## 2020-07-13 MED ORDER — DICLOFENAC SODIUM 1 % EX GEL: 2.0000 g | Freq: Four times a day (QID) | CUTANEOUS | 0 refills | Status: AC

## 2020-07-13 NOTE — Progress Notes (Signed)
Post-Op Visit Note   Patient: Craig Lopez           Date of Birth: February 18, 1961           MRN: [id number] Visit Date: 07/13/2020 PCP: Charolett Bumpers, PA-C   Assessment & Plan:  Chief Complaint:  Chief Complaint  Patient presents with  . Left Knee - Pain   Visit Diagnoses:  1. Chronic pain of left knee     Plan: Patient is a pleasant 60 year old gentleman who comes in today little over 1 year out left total knee replacement.  He still notes a deep-seated ache to the distal quad and anterolateral aspect which is worse after standing for about 12 to 15 minutes.  He also notes intermittent swelling with increased activity.  He takes an occasional oxycodone and Motrin with mild relief of symptoms.  Examination of his left knee reveals no significant joint line tenderness.  He is stable to valgus varus stress.  He is neurovascularly intact distally.  Today, we discussed that his symptoms could be from possible synovitis or quadriceps tendinosis and arthroscopy was discussed.  We have also discussed treating this with physical therapy and Voltaren gel for which he would like to try first.  He will follow up with [place] in 6 months time for recheck.  Call with concerns or questions.  Total face to face encounter time was greater than 25 minutes and over half of this time was spent in counseling and/or coordination of care.  Follow-Up Instructions: Return in about 6 months (around 01/13/2021).   Orders:  Orders Placed This Encounter  Procedures  . XR Knee 1-2 Views Left   Meds ordered this encounter  Medications  . diclofenac Sodium (VOLTAREN) 1 % GEL    Sig: Apply 2 g topically 4 (four) times daily.    Dispense:  150 g    Refill:  0    Imaging: XR Knee 1-2 Views Left  Result Date: 07/13/2020 Well-seated prosthesis without complication   PMFS History: Patient Active Problem List   Diagnosis Date Noted  . Symptomatic anemia 03/27/2019  . Primary osteoarthritis of left knee  03/24/2019  . Status post total left knee replacement 03/24/2019   Past Medical History:  Diagnosis Date  . Arthritis   . Back pain   . Environmental allergies   . Knee problem   . Sleep apnea    rarely uses his CPAP    Family History  Problem Relation Age of Onset  . Congestive Heart Failure Mother   . Lung cancer Father        He was a smoker.  . Congestive Heart Failure Sister   . Colon cancer Brother   . Congestive Heart Failure Sister     Past Surgical History:  Procedure Laterality Date  . APPENDECTOMY    . CHONDROPLASTY Right 11/27/2018   Procedure: CHONDROPLASTY;  Surgeon: Tarry Kos, MD;  Location: [facility];  Service: Orthopedics;  Laterality: Right;  . KNEE ARTHROSCOPY Right 11/27/2018   Procedure: RIGHT KNEE ARTHROSCOPY WITH MAJOR SYNOVECTOMY AND CHONDROPLASTY MEDIAL FEMORAL CONDYLE AND TROCHLEA;  Surgeon: Tarry Kos, MD;  Location: [facility];  Service: Orthopedics;  Laterality: Right;  . KNEE SURGERY Left    Multiple Knee surgeries   . SYNOVECTOMY Right 11/27/2018   Procedure: SYNOVECTOMY;  Surgeon: Tarry Kos, MD;  Location: [facility];  Service: Orthopedics;  Laterality: Right;  . TOTAL KNEE ARTHROPLASTY Left 03/24/2019  .  TOTAL KNEE ARTHROPLASTY Left 03/24/2019   Procedure: LEFT TOTAL KNEE ARTHROPLASTY;  Surgeon: Tarry Kos, MD;  Location: [facility];  Service: Orthopedics;  Laterality: Left;  . WISDOM TOOTH EXTRACTION     Social History   Occupational History  . Not on file  Tobacco Use  . Smoking status: Never Smoker  . Smokeless tobacco: Never Used  Vaping Use  . Vaping Use: Never used  Substance and Sexual Activity  . Alcohol use: Yes    Comment: socially  . Drug use: No  . Sexual activity: Not Currently

## 2020-08-04 ENCOUNTER — Ambulatory Visit (INDEPENDENT_AMBULATORY_CARE_PROVIDER_SITE_OTHER): Payer: No Typology Code available for payment source | Admitting: *Deleted

## 2020-08-04 DIAGNOSIS — J309 Allergic rhinitis, unspecified: Secondary | ICD-10-CM | POA: Diagnosis not present

## 2020-08-10 DIAGNOSIS — J301 Allergic rhinitis due to pollen: Secondary | ICD-10-CM

## 2020-08-10 NOTE — Progress Notes (Signed)
VIALS EXP 08-10-21 

## 2020-08-11 DIAGNOSIS — J3089 Other allergic rhinitis: Secondary | ICD-10-CM

## 2020-08-11 IMAGING — DX DG KNEE 1-2V PORT*L*
2 series · 2 of 2 positions shown · non-contrast
Comparison: 02/28/2019

CLINICAL DATA: Total knee replacement

EXAM:
PORTABLE LEFT KNEE - 1-2 VIEW

[knee ap]
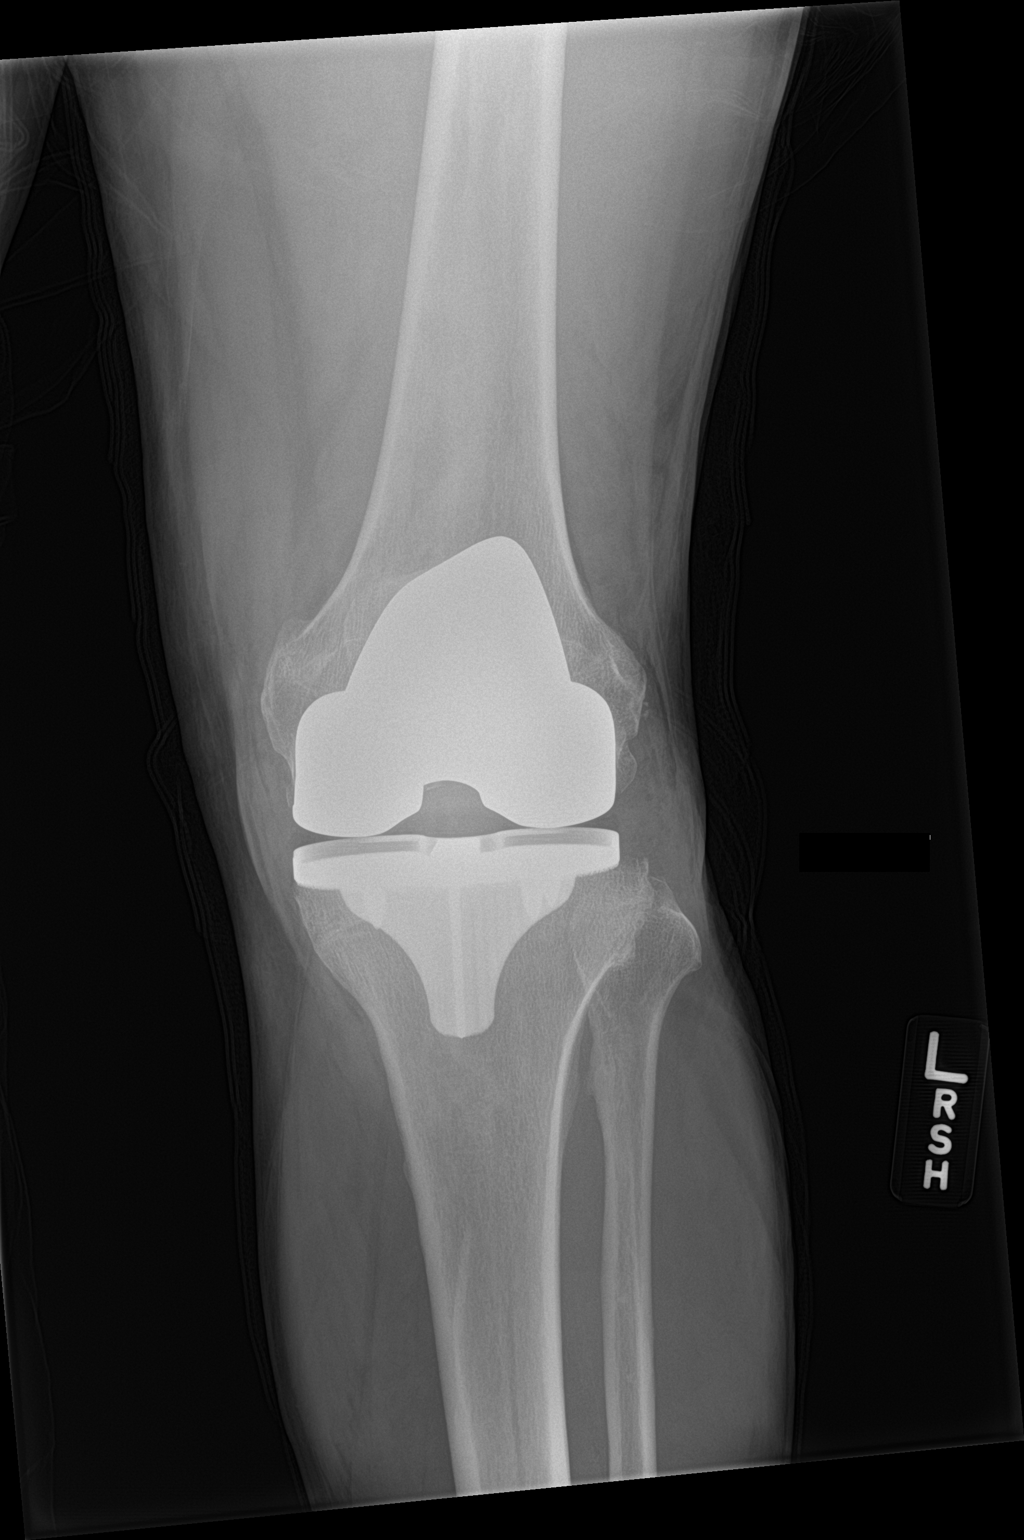

[knee lat]
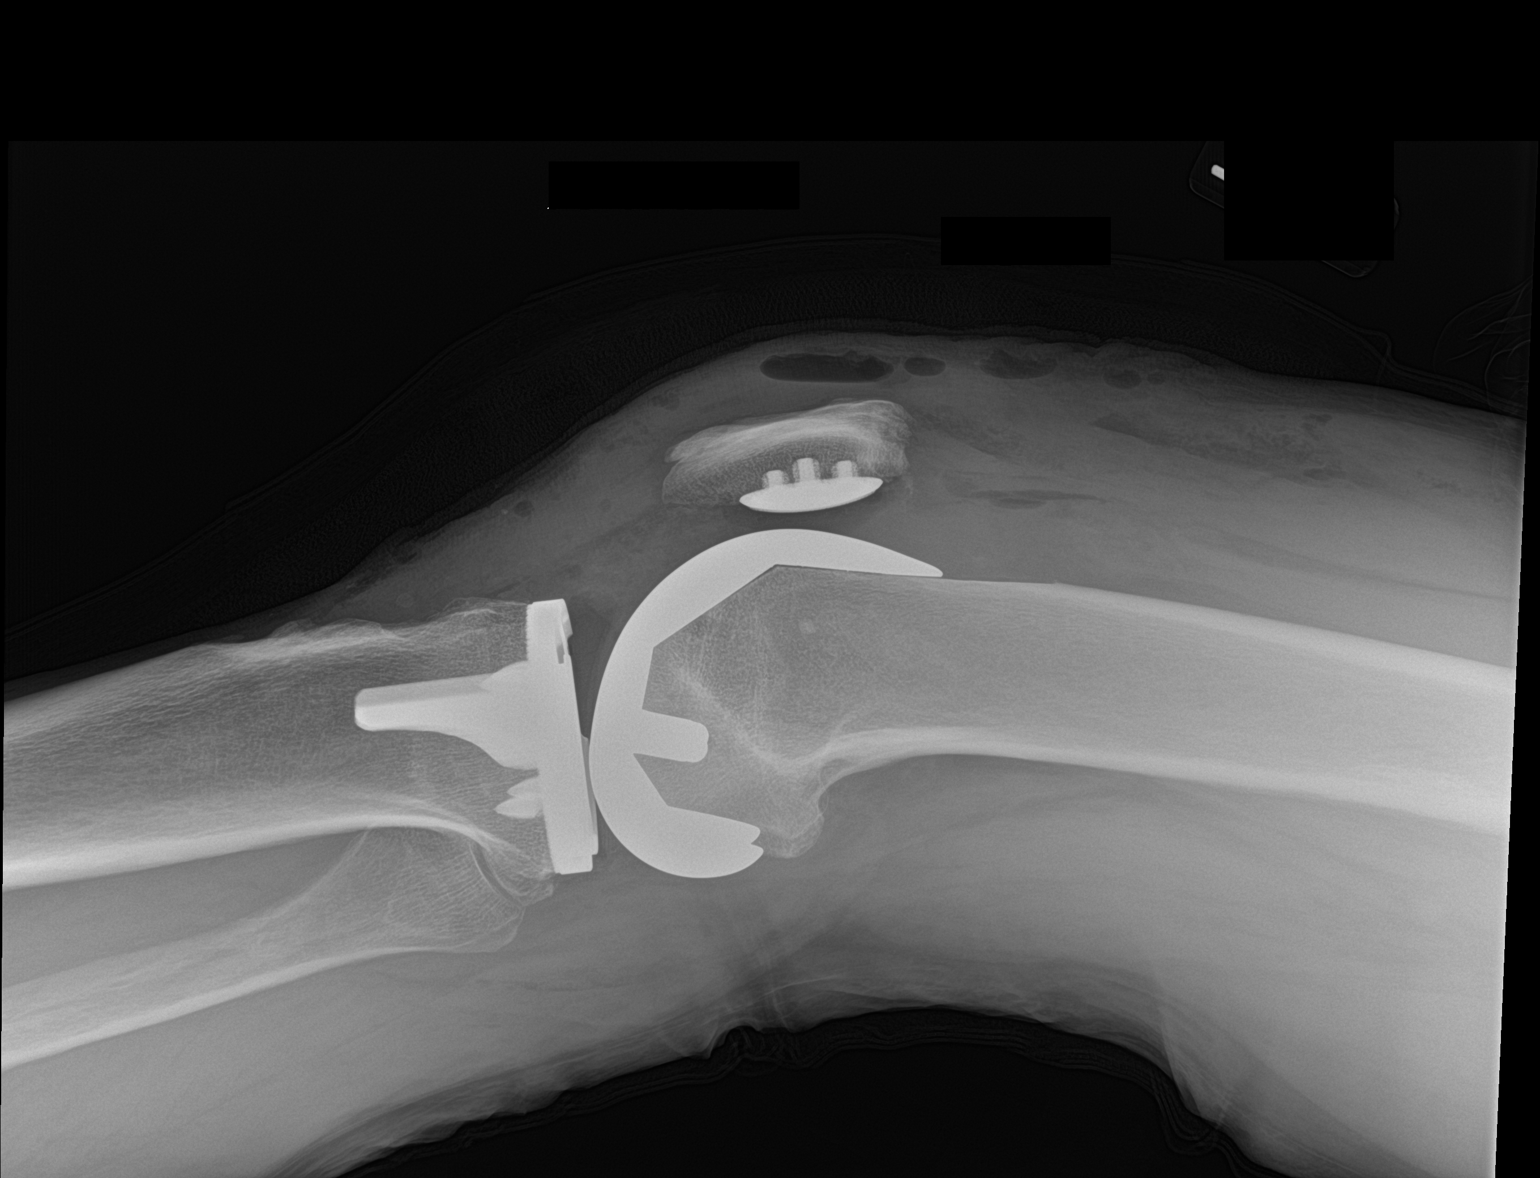

[2 of 2 positions shown; findings below may reference images not displayed]

FINDINGS: Left total knee replacement in satisfactory position alignment. Gas
and fluid in the joint and soft tissues. No fracture or immediate
complication
IMPRESSION: Satisfactory left knee replacement.

## 2020-08-15 IMAGING — CT CT ANGIO CHEST
2 of 6 series · 19 of 36 positions shown · IV contrast (omnipaque)
Comparison: Chest x-ray March 19, 2019

CLINICAL DATA: Patient status post left knee replacement 4 days
ago. Assess for pulmonary embolus.

EXAM:
CT ANGIOGRAPHY CHEST WITH CONTRAST
TECHNIQUE: Multidetector CT imaging of the chest was performed using the
standard protocol during bolus administration of intravenous
contrast. Multiplanar CT image reconstructions and MIPs were
obtained to evaluate the vascular anatomy.
CONTRAST:  100mL OMNIPAQUE IOHEXOL 350 MG/ML SOLN

[Series 8: pe thins · axial · 0.71mm/px · z∈[+1322,+1537]mm · 18 of 344 slices shown]
[im 18/344  lung]
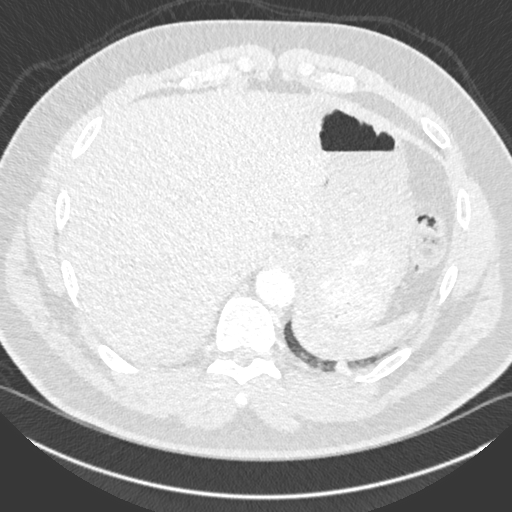
[im 35/344  mediastinal]
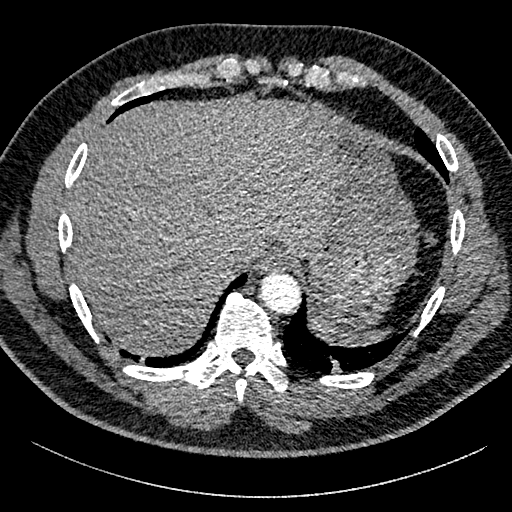
[im 52/344  lung]
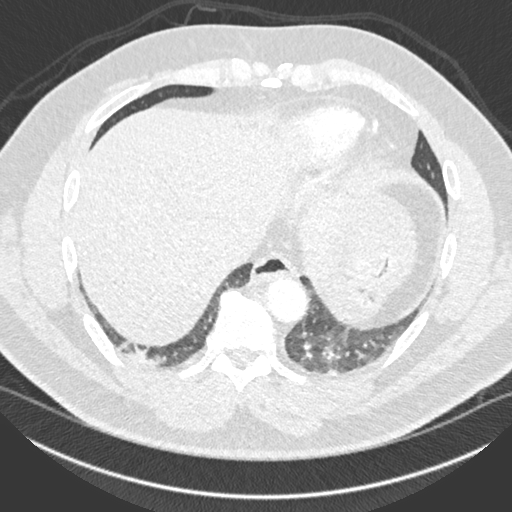
[im 69/344  mediastinal]
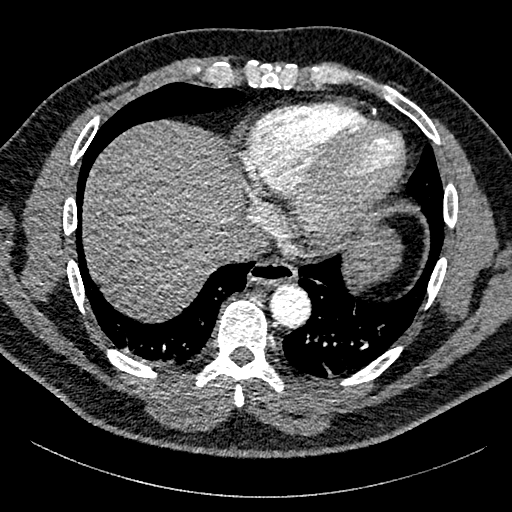
[im 86/344  lung]
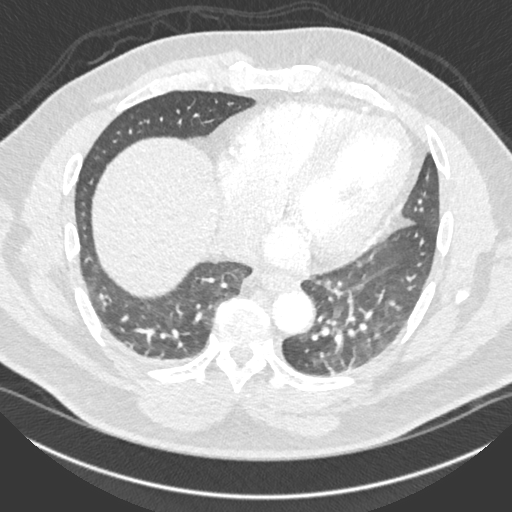
[im 103/344  mediastinal]
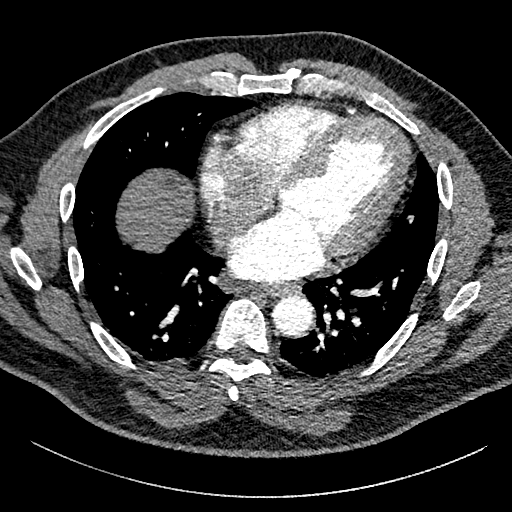
[im 121/344  lung]
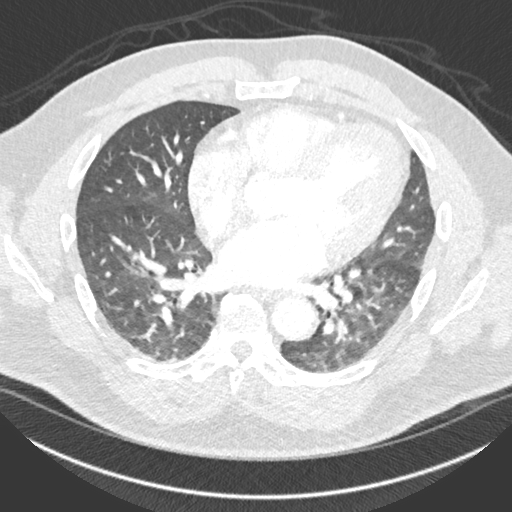
[im 138/344  mediastinal]
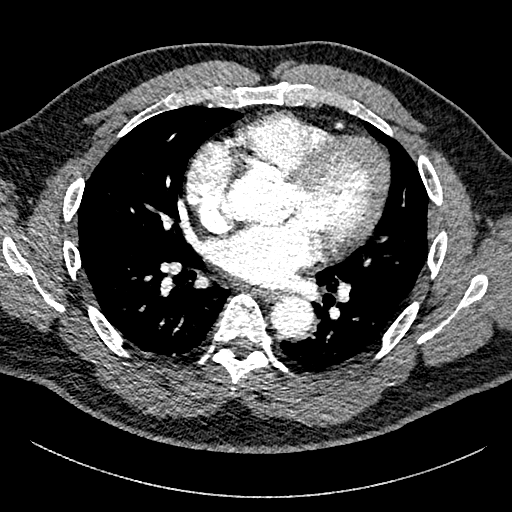
[im 155/344  lung]
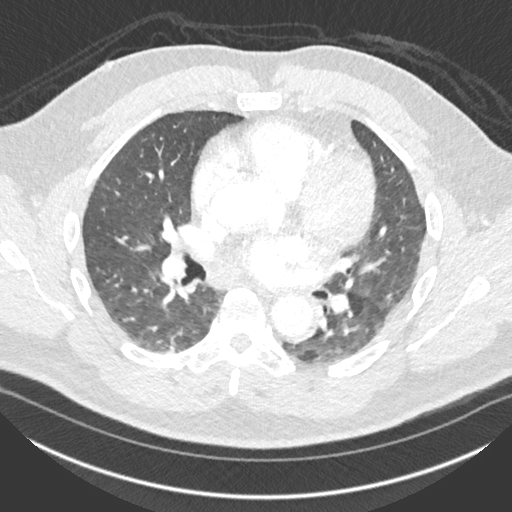
[im 189/344  mediastinal]
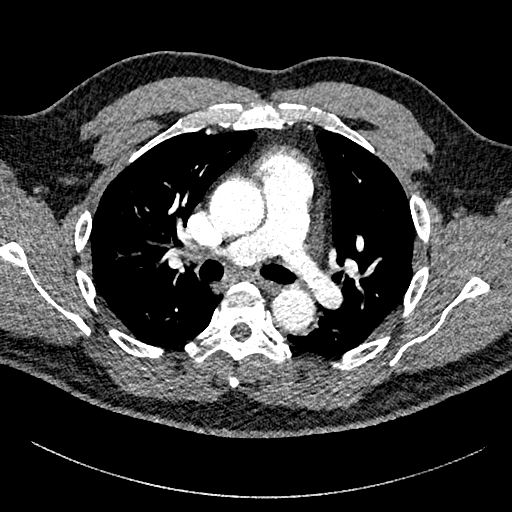
[im 206/344  lung]
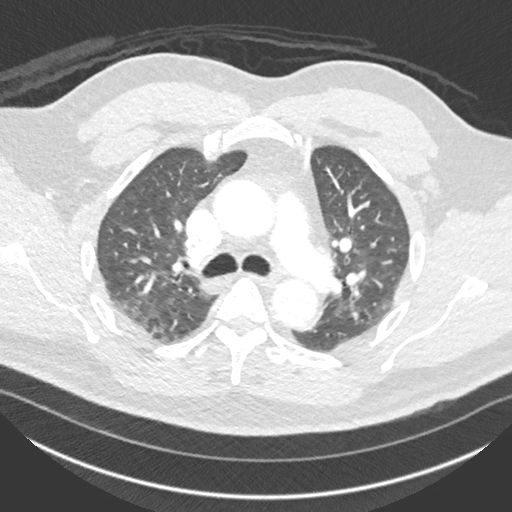
[im 223/344  mediastinal]
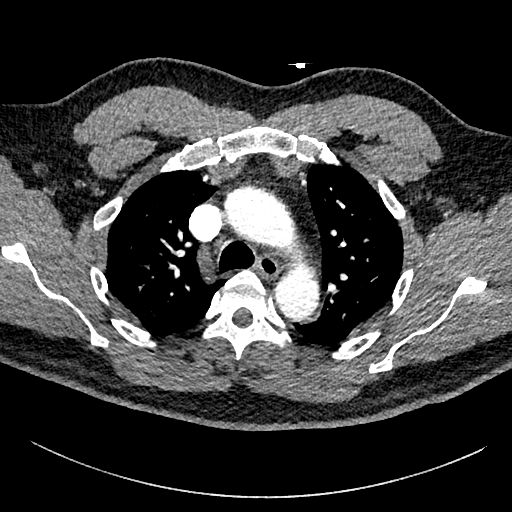
[im 241/344  lung]
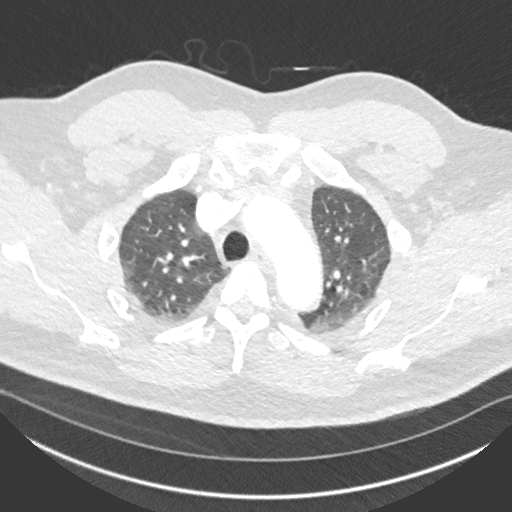
[im 258/344  mediastinal]
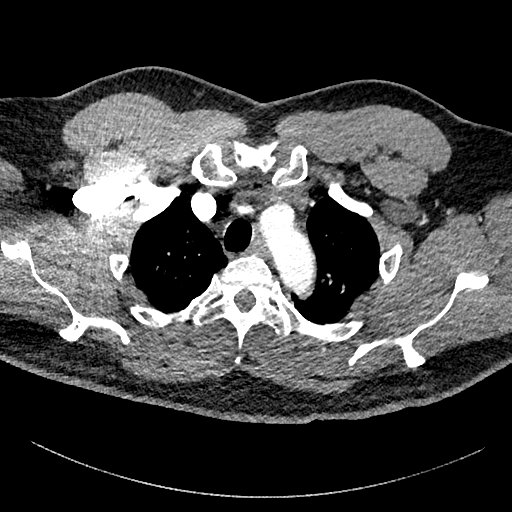
[im 275/344  lung]
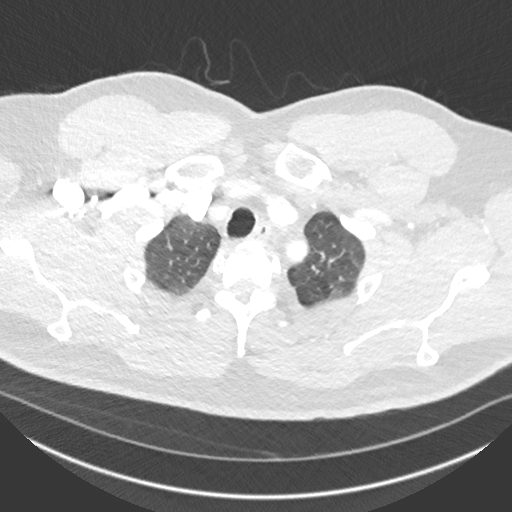
[im 292/344  mediastinal]
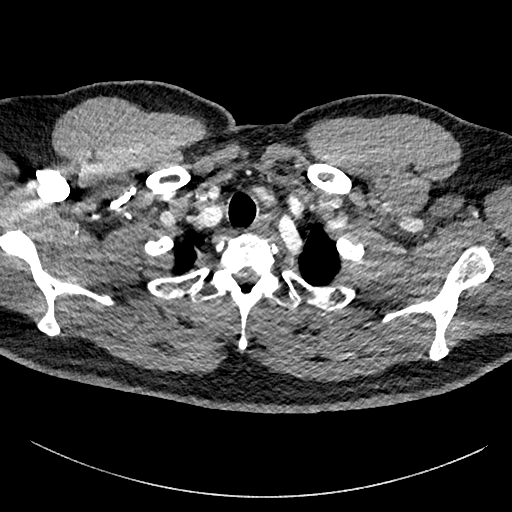
[im 309/344  lung]
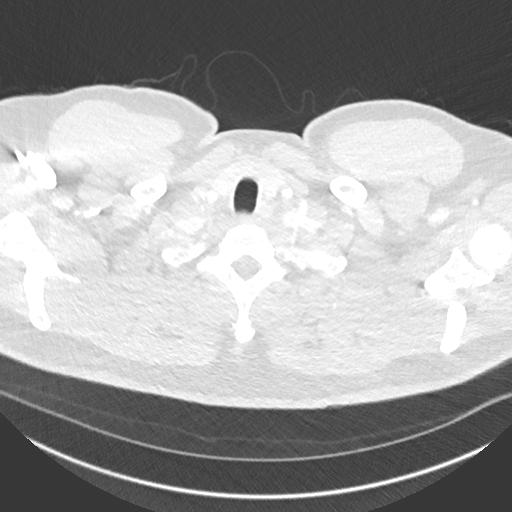
[im 326/344  mediastinal]
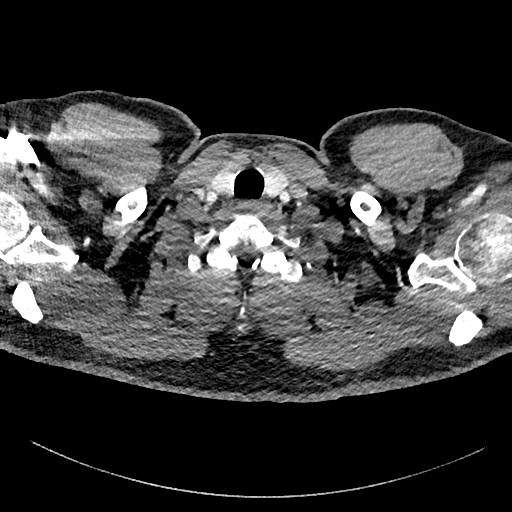

[Series 9: pe 2mm cor · coronal · 0.49mm/px · 1 of 151 slices shown]
[im 76/151  mediastinal]
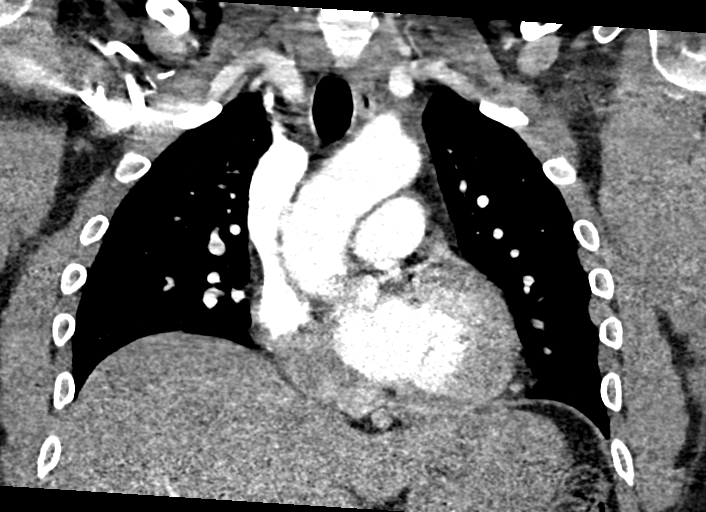

[19 of 36 positions shown; findings below may reference images not displayed]

FINDINGS: Cardiovascular: Satisfactory opacification of the pulmonary arteries
to the segmental level. No evidence of pulmonary embolism. Normal
heart size. No pericardial effusion.

Mediastinum/Nodes: No enlarged mediastinal, hilar, or axillary lymph
nodes. Thyroid gland, trachea, and esophagus demonstrate no
significant findings.

Lungs/Pleura: Mild dependent atelectasis of posterior lung bases are
noted. There is no focal pneumonia. Minimal right pleural effusion
is noted.

Upper Abdomen: No acute abnormality.

Musculoskeletal: Degenerative joint changes of the spine are noted.

Review of the MIP images confirms the above findings.
IMPRESSION: 1. No pulmonary embolus.
2. Minimal right pleural effusion.
3. Mild dependent atelectasis of posterior lung bases are noted.

## 2020-09-09 ENCOUNTER — Ambulatory Visit (INDEPENDENT_AMBULATORY_CARE_PROVIDER_SITE_OTHER): Payer: No Typology Code available for payment source | Admitting: *Deleted

## 2020-09-09 DIAGNOSIS — J309 Allergic rhinitis, unspecified: Secondary | ICD-10-CM

## 2020-10-06 ENCOUNTER — Ambulatory Visit (INDEPENDENT_AMBULATORY_CARE_PROVIDER_SITE_OTHER): Payer: No Typology Code available for payment source

## 2020-10-06 DIAGNOSIS — J309 Allergic rhinitis, unspecified: Secondary | ICD-10-CM

## 2020-10-09 ENCOUNTER — Encounter (HOSPITAL_BASED_OUTPATIENT_CLINIC_OR_DEPARTMENT_OTHER): Payer: Self-pay | Admitting: *Deleted

## 2020-10-09 ENCOUNTER — Other Ambulatory Visit: Payer: Self-pay

## 2020-10-09 ENCOUNTER — Emergency Department (HOSPITAL_BASED_OUTPATIENT_CLINIC_OR_DEPARTMENT_OTHER)
Admission: EM | Admit: 2020-10-09 | Discharge: 2020-10-09 | Disposition: A | Discharge status: Home / Self Care | Payer: No Typology Code available for payment source | Attending: Emergency Medicine | Admitting: Emergency Medicine

## 2020-10-09 DIAGNOSIS — Z7982 Long term (current) use of aspirin: Secondary | ICD-10-CM | POA: Diagnosis not present

## 2020-10-09 DIAGNOSIS — Z96652 Presence of left artificial knee joint: Secondary | ICD-10-CM | POA: Diagnosis not present

## 2020-10-09 DIAGNOSIS — J029 Acute pharyngitis, unspecified: Secondary | ICD-10-CM | POA: Insufficient documentation

## 2020-10-09 DIAGNOSIS — Z20822 Contact with and (suspected) exposure to covid-19: Secondary | ICD-10-CM | POA: Diagnosis not present

## 2020-10-09 DIAGNOSIS — R070 Pain in throat: Secondary | ICD-10-CM | POA: Diagnosis present

## 2020-10-09 LAB — GROUP A STREP BY PCR: Group A Strep by PCR: NOT DETECTED

## 2020-10-09 LAB — RESP PANEL BY RT-PCR (FLU A&B, COVID) ARPGX2
Influenza A by PCR: NEGATIVE
Influenza B by PCR: NEGATIVE
SARS Coronavirus 2 by RT PCR: NEGATIVE

## 2020-10-09 NOTE — ED Notes (Signed)
Rapid Strep Swab obtained Covid Swab obtained

## 2020-10-09 NOTE — ED Triage Notes (Signed)
Presents with sore throat onset 2 days ago. Denies fever. Having non-prod cough. Has pain upon swallowing

## 2020-10-09 NOTE — ED Provider Notes (Signed)
[facility] EMERGENCY DEPARTMENT Provider Note   CSN: [id number] Arrival date & time: 10/09/20  0945     History Chief Complaint  Patient presents with   Sore Throat    Craig Lopez is a 60 y.o. male with no relevant past medical history presents the ED with a 2-day history of sore throat and cough.  He is accompanied by his wife who is being evaluated for same symptoms.  Patient tells me that he has a 2-day history of progressively worsening sore throat symptoms.  Started off a scratchy but this morning he was having difficulty and pain eating a banana.  He denies any dental pain.  He is resting comfortably and in no acute distress.  He was with his grandson recently, but no obvious sick contacts.  He is fully immunized for COVID-19.  He has not yet taken anything for symptoms.  Denies any denies congestion, drooling, wheezing, shortness of breath, inability to eat/drink, fevers or chills, stiff neck, inability to open his mouth, or any other symptoms.  HPI     Past Medical History:  Diagnosis Date   Arthritis    Back pain    Environmental allergies    Knee problem    Sleep apnea    rarely uses his CPAP    Patient Active Problem List   Diagnosis Date Noted   Symptomatic anemia 03/27/2019   Primary osteoarthritis of left knee 03/24/2019   Status post total left knee replacement 03/24/2019    Past Surgical History:  Procedure Laterality Date   APPENDECTOMY     CHONDROPLASTY Right 11/27/2018   Procedure: CHONDROPLASTY;  Surgeon: Tarry KosXu, Naiping M, MD;  Location: [facility];  Service: Orthopedics;  Laterality: Right;   KNEE ARTHROSCOPY Right 11/27/2018   Procedure: RIGHT KNEE ARTHROSCOPY WITH MAJOR SYNOVECTOMY AND CHONDROPLASTY MEDIAL FEMORAL CONDYLE AND TROCHLEA;  Surgeon: Tarry KosXu, Naiping M, MD;  Location: [facility];  Service: Orthopedics;  Laterality: Right;   KNEE SURGERY Left    Multiple Knee surgeries    SYNOVECTOMY Right 11/27/2018    Procedure: SYNOVECTOMY;  Surgeon: Tarry KosXu, Naiping M, MD;  Location: [facility];  Service: Orthopedics;  Laterality: Right;   TOTAL KNEE ARTHROPLASTY Left 03/24/2019   TOTAL KNEE ARTHROPLASTY Left 03/24/2019   Procedure: LEFT TOTAL KNEE ARTHROPLASTY;  Surgeon: Tarry KosXu, Naiping M, MD;  Location: [facility];  Service: Orthopedics;  Laterality: Left;   WISDOM TOOTH EXTRACTION         Family History  Problem Relation Age of Onset   Congestive Heart Failure Mother    Lung cancer Father        He was a smoker.   Congestive Heart Failure Sister    Colon cancer Brother    Congestive Heart Failure Sister     Social History   Tobacco Use   Smoking status: Never   Smokeless tobacco: Never  Vaping Use   Vaping Use: Never used  Substance Use Topics   Alcohol use: Yes    Comment: socially   Drug use: No    Home Medications Prior to Admission medications   Medication Sig Start Date End Date Taking? Authorizing Provider  amoxicillin (AMOXIL) 500 MG tablet Take 4 tabs one hour prior to dental work 08/01/19   Cristie HemStanbery, Mary L, PA-C  aspirin EC 81 MG tablet Take 1 tablet (81 mg total) by mouth 2 (two) times daily. 03/24/19   Tarry KosXu, Naiping M, MD  Bepotastine Besilate (BEPREVE) 1.5 % SOLN  Can use one drop in each eye twice daily if needed for itchy eyes. Patient taking differently: Place 1 drop into both eyes 2 (two) times daily as needed (itchy eyes. (Spring/Fall ONLY)). Can use one drop in each eye twice daily if needed for itchy eyes. 08/03/15   Kozlow, Alvira Philips, MD  celecoxib (CELEBREX) 200 MG capsule Take 1 capsule (200 mg total) by mouth 2 (two) times daily. 04/03/19   Tarry Kos, MD  diclofenac Sodium (VOLTAREN) 1 % GEL Apply 2 g topically 4 (four) times daily. 07/13/20   Cristie Hem, PA-C  EPINEPHrine 0.3 mg/0.3 mL IJ SOAJ injection Use as directed for life-threatening allergic reaction. Patient taking differently: Inject 0.3 mg into the muscle as needed for anaphylaxis. Use as  directed for life-threatening allergic reaction. 08/03/15   Kozlow, Alvira Philips, MD  ferrous sulfate 325 (65 FE) MG tablet Take 1 tablet (325 mg total) by mouth daily. 03/29/19 04/28/19  Albertha Ghee, MD  ketorolac (TORADOL) 10 MG tablet Take 1 tablet (10 mg total) by mouth 2 (two) times daily as needed. 03/24/19   Tarry Kos, MD  lidocaine (LIDODERM) 5 % Place 1 patch onto the skin daily as needed (back pain.). Remove & Discard patch within 12 hours or as directed by MD    [provider]  LUBRICATING PLUS EYE DROPS 0.5 % SOLN Place 1-2 drops into both eyes 3 (three) times daily as needed for dry eyes. 10/28/18   [provider]  methocarbamol (ROBAXIN) 750 MG tablet Take 1 tablet (750 mg total) by mouth 2 (two) times daily as needed for muscle spasms. 03/24/19   Tarry Kos, MD  ondansetron (ZOFRAN) 4 MG tablet Take 1-2 tablets (4-8 mg total) by mouth every 8 (eight) hours as needed for nausea or vomiting. 03/24/19   Tarry Kos, MD  oxyCODONE (OXY IR/ROXICODONE) 5 MG immediate release tablet Take 5 mg by mouth every 4 (four) hours as needed (pain.).    [provider]  oxyCODONE (OXY IR/ROXICODONE) 5 MG immediate release tablet Take 1-2 tablets (5-10 mg total) by mouth every 8 (eight) hours as needed for severe pain. 03/24/19   Tarry Kos, MD  QUEtiapine (SEROQUEL) 100 MG tablet Take 300 mg by mouth at bedtime.    [provider]  sildenafil (VIAGRA) 100 MG tablet Take 100 mg by mouth daily as needed for erectile dysfunction. 12/30/18   [provider]  sulfamethoxazole-trimethoprim (BACTRIM DS) 800-160 MG tablet Take 1 tablet by mouth 2 (two) times daily. 03/24/19   Tarry Kos, MD  venlafaxine XR (EFFEXOR-XR) 75 MG 24 hr capsule Take 225 mg by mouth daily. 12/26/18   [provider]    Allergies    Patient has no known allergies.  Review of Systems   Review of Systems  All other systems reviewed and are negative.  Physical Exam Updated  Vital Signs BP 138/86 (BP Location: Right Arm)   Temp 98 F (36.7 C) (Oral)   Resp 18   Ht 6\' 1"  (1.854 m)   Wt 130.2 kg   SpO2 98%   BMI 37.87 kg/m   Physical Exam Vitals and nursing note reviewed. Exam conducted with a chaperone present.  Constitutional:      Appearance: Normal appearance.  HENT:     Head: Normocephalic and atraumatic.     Mouth/Throat:     Comments: Patent oropharynx.  Mildly erythematous.  No uvular deviation.  No tongue swelling or floor of  mouth inflammation.  Tolerating secretions well.  No trismus. Eyes:     General: No scleral icterus.    Conjunctiva/sclera: Conjunctivae normal.  Neck:     Comments: No meningismus. Cardiovascular:     Rate and Rhythm: Normal rate.     Pulses: Normal pulses.  Pulmonary:     Effort: Pulmonary effort is normal. No respiratory distress.     Breath sounds: Normal breath sounds. No stridor. No wheezing.  Musculoskeletal:     Cervical back: Normal range of motion. No rigidity.  Skin:    General: Skin is dry.  Neurological:     Mental Status: He is alert.     GCS: GCS eye subscore is 4. GCS verbal subscore is 5. GCS motor subscore is 6.  Psychiatric:        Mood and Affect: Mood normal.        Behavior: Behavior normal.        Thought Content: Thought content normal.    ED Results / Procedures / Treatments   Labs (all labs ordered are listed, but only abnormal results are displayed) Labs Reviewed  GROUP A STREP BY PCR  RESP PANEL BY RT-PCR (FLU A&B, COVID) [id number]    EKG None  Radiology No results found.  Procedures Procedures   Medications Ordered in ED Medications - No data to display  ED Course  I have reviewed the triage vital signs and the nursing notes.  Pertinent labs & imaging results that were available during my care of the patient were reviewed by me and considered in my medical decision making (see chart for details).    MDM Rules/Calculators/A&P                          Craig Lopez was evaluated in Emergency Department on 10/09/2020 for the symptoms described in the history of present illness. He was evaluated in the context of the global COVID-19 pandemic, which necessitated consideration that the patient might be at risk for infection with the SARS-CoV-2 virus that causes COVID-19. Institutional protocols and algorithms that pertain to the evaluation of patients at risk for COVID-19 are in a state of rapid change based on information released by regulatory bodies including the CDC and federal and state organizations. These policies and algorithms were followed during the patient's care in the ED.  I personally reviewed patient's medical chart and all notes from triage and staff during today's encounter. I have also ordered and reviewed all labs and imaging that I felt to be medically necessary in the evaluation of this patient's complaints and with consideration of their physical exam. If needed, translation services were available and utilized.   Suspect viral URI versus viral pharyngitis.  However, will obtain group A strep by PCR.  If negative, conservative management.  Patient able to speak in complete sentences without difficulty swallowing or trouble handling secretions.  No stridor, wheezing, tripoding, grey pseudomembrane, trismus, uvular deviation, sublingual/submandibular/submental swelling or induration, nuchal rigidity, or neck pain.  Low suspicion for deep tissue infection such as PTA or RPA, epiglottitis, vertebral dissection, meningitis, or other emergent pathology.    Discussed conservative therapy such as warm tea with honey, Chloraseptic spray, throat lozenges, and salt-water gargles.  Encouraged NSAIDs and emphasized importance of oral hydration and antipyretics as needed for fever control.    Patient tested negative for group A strep by PCR and COVID-19/influenza testing.  Final Clinical Impression(s) / ED Diagnoses Final diagnoses:  Viral pharyngitis     Rx / DC Orders ED Discharge Orders     None        Lorelee New, PA-C 10/09/20 1125    Melene Plan, DO 10/09/20 1214

## 2020-10-09 NOTE — Discharge Instructions (Addendum)
Your physical exam and work-up today was all reassuring.  Please follow-up with your primary care provider for ongoing evaluation and management.  I suspect that you have a viral pharyngitis/upper respiratory infection.  I recommend over-the-counter Chloraseptic throat lozenges.  I would also like you to take ibuprofen 600 mg every 6 hours to help with pain and inflammation.  Warm tea with honey is also quite helpful.  Check your temperature if you feel as though you are developing a fever.  While you were negative here in the ER for COVID-19, influenza, and strep pharyngitis, suspect that you still have a viral communicable illness.  Return to the ER seek immediate medical attention should you experience any new or worsening symptoms.

## 2020-11-09 ENCOUNTER — Ambulatory Visit (INDEPENDENT_AMBULATORY_CARE_PROVIDER_SITE_OTHER): Payer: No Typology Code available for payment source | Admitting: *Deleted

## 2020-11-09 DIAGNOSIS — J309 Allergic rhinitis, unspecified: Secondary | ICD-10-CM

## 2020-11-18 ENCOUNTER — Ambulatory Visit (INDEPENDENT_AMBULATORY_CARE_PROVIDER_SITE_OTHER): Payer: No Typology Code available for payment source

## 2020-11-18 DIAGNOSIS — J309 Allergic rhinitis, unspecified: Secondary | ICD-10-CM | POA: Diagnosis not present

## 2020-12-01 ENCOUNTER — Ambulatory Visit (INDEPENDENT_AMBULATORY_CARE_PROVIDER_SITE_OTHER): Payer: No Typology Code available for payment source

## 2020-12-01 DIAGNOSIS — J309 Allergic rhinitis, unspecified: Secondary | ICD-10-CM

## 2020-12-09 ENCOUNTER — Ambulatory Visit (INDEPENDENT_AMBULATORY_CARE_PROVIDER_SITE_OTHER): Payer: No Typology Code available for payment source | Admitting: *Deleted

## 2020-12-09 DIAGNOSIS — J309 Allergic rhinitis, unspecified: Secondary | ICD-10-CM

## 2020-12-29 ENCOUNTER — Ambulatory Visit (INDEPENDENT_AMBULATORY_CARE_PROVIDER_SITE_OTHER): Payer: No Typology Code available for payment source

## 2020-12-29 DIAGNOSIS — J309 Allergic rhinitis, unspecified: Secondary | ICD-10-CM | POA: Diagnosis not present

## 2021-01-04 ENCOUNTER — Ambulatory Visit (INDEPENDENT_AMBULATORY_CARE_PROVIDER_SITE_OTHER): Payer: No Typology Code available for payment source | Admitting: *Deleted

## 2021-01-04 DIAGNOSIS — J309 Allergic rhinitis, unspecified: Secondary | ICD-10-CM

## 2021-01-06 ENCOUNTER — Other Ambulatory Visit: Payer: Self-pay

## 2021-01-06 ENCOUNTER — Ambulatory Visit: Payer: Self-pay

## 2021-01-06 ENCOUNTER — Ambulatory Visit (INDEPENDENT_AMBULATORY_CARE_PROVIDER_SITE_OTHER): Payer: No Typology Code available for payment source | Admitting: Orthopaedic Surgery

## 2021-01-06 ENCOUNTER — Encounter: Payer: Self-pay | Admitting: Orthopaedic Surgery

## 2021-01-06 DIAGNOSIS — M25562 Pain in left knee: Secondary | ICD-10-CM | POA: Diagnosis not present

## 2021-01-06 DIAGNOSIS — Z96652 Presence of left artificial knee joint: Secondary | ICD-10-CM

## 2021-01-06 DIAGNOSIS — G8929 Other chronic pain: Secondary | ICD-10-CM | POA: Diagnosis not present

## 2021-01-06 NOTE — Progress Notes (Signed)
Office Visit Note   Patient: Craig Lopez           Date of Birth: 1960/06/12           MRN: [id number] Visit Date: 01/06/2021              Requested by: Charolett Bumpers, PA-C [id number] [place],  [place] [address] PCP: Charolett Bumpers, PA-C   Assessment & Plan: Visit Diagnoses:  1. S/P TKR (total knee replacement), left     Plan: Craig Lopez is now almost 2 years status post left knee replacement.  He is overall happy with the outcome and the result.  From my standpoint we will just have him follow-up with [place] as needed.  Follow-Up Instructions: Return if symptoms worsen or fail to improve.   Orders:  Orders Placed This Encounter  Procedures   XR KNEE 3 VIEW LEFT   No orders of the defined types were placed in this encounter.     Procedures: No procedures performed   Clinical Data: No additional findings.   Subjective: Chief Complaint  Patient presents with   Left Knee - Pain, Follow-up    HPI  Craig Lopez is a 60 year old gentleman who is status post left uncemented total knee replacement on 03/24/2019.  Overall he is happy with outcome his ability to perform most activities of daily living without pain or discomfort.  He is a bit disappointed that he is not able to play racquetball or ride his horse for too long.  Review of Systems   Objective: Vital Signs: There were no vitals taken for this visit.  Physical Exam  Ortho Exam  Left knee shows a fully healed surgical scar.  Range of motion is 3 to 110 degrees.  Stable to varus valgus.  No swelling or warmth or signs of infection.  Specialty Comments:  No specialty comments available.  Imaging: XR KNEE 3 VIEW LEFT  Result Date: 01/06/2021 Stable total knee replacement without complication    PMFS History: Patient Active Problem List   Diagnosis Date Noted   Symptomatic anemia 03/27/2019   Primary osteoarthritis of left knee 03/24/2019   Status post total left knee replacement 03/24/2019    Past Medical History:  Diagnosis Date   Arthritis    Back pain    Environmental allergies    Knee problem    Sleep apnea    rarely uses his CPAP    Family History  Problem Relation Age of Onset   Congestive Heart Failure Mother    Lung cancer Father        He was a smoker.   Congestive Heart Failure Sister    Colon cancer Brother    Congestive Heart Failure Sister     Past Surgical History:  Procedure Laterality Date   APPENDECTOMY     CHONDROPLASTY Right 11/27/2018   Procedure: CHONDROPLASTY;  Surgeon: Tarry Kos, MD;  Location: [facility];  Service: Orthopedics;  Laterality: Right;   KNEE ARTHROSCOPY Right 11/27/2018   Procedure: RIGHT KNEE ARTHROSCOPY WITH MAJOR SYNOVECTOMY AND CHONDROPLASTY MEDIAL FEMORAL CONDYLE AND TROCHLEA;  Surgeon: Tarry Kos, MD;  Location: [facility];  Service: Orthopedics;  Laterality: Right;   KNEE SURGERY Left    Multiple Knee surgeries    SYNOVECTOMY Right 11/27/2018   Procedure: SYNOVECTOMY;  Surgeon: Tarry Kos, MD;  Location: [facility];  Service: Orthopedics;  Laterality: Right;   TOTAL KNEE ARTHROPLASTY Left 03/24/2019  TOTAL KNEE ARTHROPLASTY Left 03/24/2019   Procedure: LEFT TOTAL KNEE ARTHROPLASTY;  Surgeon: Tarry Kos, MD;  Location: [facility];  Service: Orthopedics;  Laterality: Left;   WISDOM TOOTH EXTRACTION     Social History   Occupational History   Not on file  Tobacco Use   Smoking status: Never   Smokeless tobacco: Never  Vaping Use   Vaping Use: Never used  Substance and Sexual Activity   Alcohol use: Yes    Comment: socially   Drug use: No   Sexual activity: Not Currently

## 2021-01-07 ENCOUNTER — Ambulatory Visit: Payer: No Typology Code available for payment source | Admitting: Orthopaedic Surgery

## 2021-01-13 ENCOUNTER — Ambulatory Visit: Payer: No Typology Code available for payment source | Admitting: Orthopaedic Surgery

## 2021-01-23 ENCOUNTER — Emergency Department (HOSPITAL_COMMUNITY): Payer: No Typology Code available for payment source

## 2021-01-23 ENCOUNTER — Other Ambulatory Visit: Payer: Self-pay

## 2021-01-23 ENCOUNTER — Emergency Department (HOSPITAL_COMMUNITY)
Admission: EM | Admit: 2021-01-23 | Discharge: 2021-01-23 | Disposition: A | Discharge status: Home / Self Care | Payer: No Typology Code available for payment source | Attending: Emergency Medicine | Admitting: Emergency Medicine

## 2021-01-23 ENCOUNTER — Encounter (HOSPITAL_COMMUNITY): Payer: Self-pay | Admitting: Emergency Medicine

## 2021-01-23 DIAGNOSIS — S300XXA Contusion of lower back and pelvis, initial encounter: Secondary | ICD-10-CM | POA: Insufficient documentation

## 2021-01-23 DIAGNOSIS — Z96652 Presence of left artificial knee joint: Secondary | ICD-10-CM | POA: Diagnosis not present

## 2021-01-23 DIAGNOSIS — W109XXA Fall (on) (from) unspecified stairs and steps, initial encounter: Secondary | ICD-10-CM | POA: Diagnosis not present

## 2021-01-23 DIAGNOSIS — Z7982 Long term (current) use of aspirin: Secondary | ICD-10-CM | POA: Diagnosis not present

## 2021-01-23 DIAGNOSIS — T07XXXA Unspecified multiple injuries, initial encounter: Secondary | ICD-10-CM

## 2021-01-23 DIAGNOSIS — S5001XA Contusion of right elbow, initial encounter: Secondary | ICD-10-CM | POA: Diagnosis not present

## 2021-01-23 DIAGNOSIS — S8992XA Unspecified injury of left lower leg, initial encounter: Secondary | ICD-10-CM | POA: Diagnosis present

## 2021-01-23 DIAGNOSIS — W19XXXA Unspecified fall, initial encounter: Secondary | ICD-10-CM

## 2021-01-23 DIAGNOSIS — S8002XA Contusion of left knee, initial encounter: Secondary | ICD-10-CM | POA: Diagnosis not present

## 2021-01-23 MED ORDER — FENTANYL CITRATE PF 50 MCG/ML IJ SOSY
100.0000 ug | PREFILLED_SYRINGE | Freq: Once | INTRAMUSCULAR | Status: AC
  Administered 2021-01-23: 100 ug via INTRAMUSCULAR
  Filled 2021-01-23: qty 2

## 2021-01-23 NOTE — ED Provider Notes (Signed)
[facility] EMERGENCY DEPARTMENT Provider Note   CSN: [id number] Arrival date & time: 01/23/21  1216     History Chief Complaint  Patient presents with   Craig Lopez is a 60 y.o. male.  HPI He presents for evaluation of injuries from fall.  He states he was going down some steps and fell the last 3.  He was unable to get up because of injuries to the left knee, low back and right elbow.  He was transferred by EMS who treated with fentanyl.  He had improvement of the pain but is now recurred.  He has oxycodone at home which he uses for pain, as needed.  He is not currently employed.  He denies weakness, paresthesias, nausea, vomiting, shortness of breath or chest pain.  He does not feel like he has had or strained his neck.  There are no other known active modifying factors.    Past Medical History:  Diagnosis Date   Arthritis    Back pain    Environmental allergies    Knee problem    Sleep apnea    rarely uses his CPAP    Patient Active Problem List   Diagnosis Date Noted   Symptomatic anemia 03/27/2019   Primary osteoarthritis of left knee 03/24/2019   Status post total left knee replacement 03/24/2019    Past Surgical History:  Procedure Laterality Date   APPENDECTOMY     CHONDROPLASTY Right 11/27/2018   Procedure: CHONDROPLASTY;  Surgeon: Tarry Kos, MD;  Location: [facility];  Service: Orthopedics;  Laterality: Right;   KNEE ARTHROSCOPY Right 11/27/2018   Procedure: RIGHT KNEE ARTHROSCOPY WITH MAJOR SYNOVECTOMY AND CHONDROPLASTY MEDIAL FEMORAL CONDYLE AND TROCHLEA;  Surgeon: Tarry Kos, MD;  Location: [facility];  Service: Orthopedics;  Laterality: Right;   KNEE SURGERY Left    Multiple Knee surgeries    SYNOVECTOMY Right 11/27/2018   Procedure: SYNOVECTOMY;  Surgeon: Tarry Kos, MD;  Location: [facility];  Service: Orthopedics;  Laterality: Right;   TOTAL KNEE ARTHROPLASTY Left  03/24/2019   TOTAL KNEE ARTHROPLASTY Left 03/24/2019   Procedure: LEFT TOTAL KNEE ARTHROPLASTY;  Surgeon: Tarry Kos, MD;  Location: [facility];  Service: Orthopedics;  Laterality: Left;   WISDOM TOOTH EXTRACTION         Family History  Problem Relation Age of Onset   Congestive Heart Failure Mother    Lung cancer Father        He was a smoker.   Congestive Heart Failure Sister    Colon cancer Brother    Congestive Heart Failure Sister     Social History   Tobacco Use   Smoking status: Never   Smokeless tobacco: Never  Vaping Use   Vaping Use: Never used  Substance Use Topics   Alcohol use: Yes    Comment: socially   Drug use: No    Home Medications Prior to Admission medications   Medication Sig Start Date End Date Taking? Authorizing Provider  amoxicillin (AMOXIL) 500 MG tablet Take 4 tabs one hour prior to dental work 08/01/19   Cristie Hem, PA-C  aspirin EC 81 MG tablet Take 1 tablet (81 mg total) by mouth 2 (two) times daily. 03/24/19   Tarry Kos, MD  Bepotastine Besilate (BEPREVE) 1.5 % SOLN Can use one drop in each eye twice daily if needed for itchy eyes. Patient taking differently: Place 1 drop into  both eyes 2 (two) times daily as needed (itchy eyes. (Spring/Fall ONLY)). Can use one drop in each eye twice daily if needed for itchy eyes. 08/03/15   Kozlow, Alvira Philips, MD  celecoxib (CELEBREX) 200 MG capsule Take 1 capsule (200 mg total) by mouth 2 (two) times daily. 04/03/19   Tarry Kos, MD  diclofenac Sodium (VOLTAREN) 1 % GEL Apply 2 g topically 4 (four) times daily. 07/13/20   Cristie Hem, PA-C  EPINEPHrine 0.3 mg/0.3 mL IJ SOAJ injection Use as directed for life-threatening allergic reaction. Patient taking differently: Inject 0.3 mg into the muscle as needed for anaphylaxis. Use as directed for life-threatening allergic reaction. 08/03/15   Kozlow, Alvira Philips, MD  ferrous sulfate 325 (65 FE) MG tablet Take 1 tablet (325 mg total) by mouth daily. 03/29/19  04/28/19  Albertha Ghee, MD  ketorolac (TORADOL) 10 MG tablet Take 1 tablet (10 mg total) by mouth 2 (two) times daily as needed. 03/24/19   Tarry Kos, MD  lidocaine (LIDODERM) 5 % Place 1 patch onto the skin daily as needed (back pain.). Remove & Discard patch within 12 hours or as directed by MD    [provider]  LUBRICATING PLUS EYE DROPS 0.5 % SOLN Place 1-2 drops into both eyes 3 (three) times daily as needed for dry eyes. 10/28/18   [provider]  methocarbamol (ROBAXIN) 750 MG tablet Take 1 tablet (750 mg total) by mouth 2 (two) times daily as needed for muscle spasms. 03/24/19   Tarry Kos, MD  ondansetron (ZOFRAN) 4 MG tablet Take 1-2 tablets (4-8 mg total) by mouth every 8 (eight) hours as needed for nausea or vomiting. 03/24/19   Tarry Kos, MD  oxyCODONE (OXY IR/ROXICODONE) 5 MG immediate release tablet Take 5 mg by mouth every 4 (four) hours as needed (pain.).    [provider]  oxyCODONE (OXY IR/ROXICODONE) 5 MG immediate release tablet Take 1-2 tablets (5-10 mg total) by mouth every 8 (eight) hours as needed for severe pain. 03/24/19   Tarry Kos, MD  QUEtiapine (SEROQUEL) 100 MG tablet Take 300 mg by mouth at bedtime.    [provider]  sildenafil (VIAGRA) 100 MG tablet Take 100 mg by mouth daily as needed for erectile dysfunction. 12/30/18   [provider]  sulfamethoxazole-trimethoprim (BACTRIM DS) 800-160 MG tablet Take 1 tablet by mouth 2 (two) times daily. 03/24/19   Tarry Kos, MD  venlafaxine XR (EFFEXOR-XR) 75 MG 24 hr capsule Take 225 mg by mouth daily. 12/26/18   [provider]    Allergies    Patient has no known allergies.  Review of Systems   Review of Systems  All other systems reviewed and are negative.  Physical Exam Updated Vital Signs BP (!) 129/92 (BP Location: Left Arm)   Pulse 77   Temp 98.4 F (36.9 C) (Oral)   Resp 16   SpO2 99%   Physical Exam Vitals and nursing note reviewed.   Constitutional:      General: He is not in acute distress.    Appearance: He is well-developed. He is not ill-appearing, toxic-appearing or diaphoretic.  HENT:     Head: Normocephalic and atraumatic.     Right Ear: External ear normal.     Left Ear: External ear normal.  Eyes:     Conjunctiva/sclera: Conjunctivae normal.     Pupils: Pupils are equal, round, and reactive to light.  Neck:     Trachea:  Phonation normal.  Cardiovascular:     Rate and Rhythm: Normal rate.  Pulmonary:     Effort: Pulmonary effort is normal.  Abdominal:     General: There is no distension.  Musculoskeletal:     Cervical back: Normal range of motion and neck supple.     Comments: No significant limitations of movement of the arms or legs.  He has mild tenderness with movement of the left knee but is able to range it greater than 50%.  The left knee is mildly tender laterally.  Right elbow tender on the olecranon but is not swollen and has completely normal range of motion.  Lower lumbar midline and right lateral are somewhat tender.  He has good range of motion of the back.  Skin:    General: Skin is warm and dry.  Neurological:     Mental Status: He is alert and oriented to person, place, and time.     Cranial Nerves: No cranial nerve deficit.     Sensory: No sensory deficit.     Motor: No abnormal muscle tone.     Coordination: Coordination normal.  Psychiatric:        Mood and Affect: Mood normal.        Behavior: Behavior normal.        Thought Content: Thought content normal.        Judgment: Judgment normal.    ED Results / Procedures / Treatments   Labs (all labs ordered are listed, but only abnormal results are displayed) Labs Reviewed - No data to display  EKG None  Radiology DG Lumbar Spine Complete  Result Date: 01/23/2021 CLINICAL DATA:  Status post fall EXAM: LUMBAR SPINE - COMPLETE 4+ VIEW COMPARISON:  10/30/2019 FINDINGS: 5 nonrib bearing lumbar-type vertebral bodies. Vertebral  body heights are maintained. No acute fracture. Generalized osteopenia. No static listhesis. No spondylolysis. Mild degenerative disease with disc height loss at T12-L1. Bilateral facet arthropathy at L5-S1. SI joints are unremarkable. IMPRESSION: No acute osseous injury of the lumbar spine. Electronically Signed   By: Elige Ko M.D.   On: 01/23/2021 13:34   DG Elbow Complete Right  Result Date: 01/23/2021 CLINICAL DATA:  Status post fall, elbow pain EXAM: RIGHT ELBOW - COMPLETE 3+ VIEW COMPARISON:  None. FINDINGS: No acute fracture or dislocation. No aggressive osseous lesion. Normal alignment. No joint effusion. In the pathic changes at the triceps tendon insertion. Small marginal osteophyte along the periphery of the proximal ulna. Soft tissue are unremarkable. No radiopaque foreign body or soft tissue emphysema. IMPRESSION: No acute osseous injury of the left elbow. Electronically Signed   By: Elige Ko M.D.   On: 01/23/2021 13:31   DG Knee 2 Views Left  Result Date: 01/23/2021 CLINICAL DATA:  Status post fall, knee pain EXAM: LEFT KNEE - 1-2 VIEW COMPARISON:  01/06/2021 FINDINGS: No acute fracture or dislocation. No aggressive osseous lesion. Normal alignment. Left total knee arthroplasty without hardware failure or complication. No joint effusion. Soft tissue are unremarkable. No radiopaque foreign body or soft tissue emphysema. IMPRESSION: No acute osseous injury of the left knee. Electronically Signed   By: Elige Ko M.D.   On: 01/23/2021 13:28    Procedures Procedures   Medications Ordered in ED Medications  fentaNYL (SUBLIMAZE) injection 100 mcg (has no administration in time range)    ED Course  I have reviewed the triage vital signs and the nursing notes.  Pertinent labs & imaging results that were available during my care  of the patient were reviewed by me and considered in my medical decision making (see chart for details).    MDM Rules/Calculators/A&P                             Patient Vitals for the past 24 hrs:  BP Temp Temp src Pulse Resp SpO2  01/23/21 1610 (!) 129/92 98.4 F (36.9 C) Oral 77 16 99 %  01/23/21 1217 (!) 145/103 98 F (36.7 C) Oral (!) 103 16 97 %    4:57 PM Reevaluation with update and discussion. After initial assessment and treatment, an updated evaluation reveals no change in clinical status, findings discussed and questions answered. Mancel Bale   Medical Decision Making:  This patient is presenting for evaluation of injuries from fall, which does require a range of treatment options, and is a complaint that involves a moderate risk of morbidity and mortality. The differential diagnoses include contusion, fracture, sprain. I decided to review old records, and in summary middle-aged male, excellently fell, presents by EMS for evaluation..  I did not require additional historical information from anyone.   Radiologic Tests Ordered, included films of the lumbar spine, left knee, right elbow.  I independently Visualized: Radiograph images, which show no fracture   Critical Interventions-clinical evaluation, radiography, medication treatment, observation and discharge  After These Interventions, the Patient was reevaluated and was found stable for discharge.  Patient with contusions but no fractures.  Doubt spine injury.  CRITICAL CARE-no Performed by: Mancel Bale  Nursing Notes Reviewed/ Care Coordinated Applicable Imaging Reviewed Interpretation of Laboratory Data incorporated into ED treatment  The patient appears reasonably screened and/or stabilized for discharge and I doubt any other medical condition or other [facility] requiring further screening, evaluation, or treatment in the ED at this time prior to discharge.  Plan: Home Medications-continue usual; Home Treatments-cryotherapy and heat therapy; return here if the recommended treatment, does not improve the symptoms; Recommended follow up-PCP, as  needed     Final Clinical Impression(s) / ED Diagnoses Final diagnoses:  Fall, initial encounter  Contusion, multiple sites    Rx / DC Orders ED Discharge Orders     None        Mancel Bale, MD 01/23/21 1657

## 2021-01-23 NOTE — Discharge Instructions (Signed)
No serious injuries were found from the fall today.  Use ice on the sore spots several times a day for 2 days after that use heat.  Follow-up with your primary care doctor or the on-call orthopedic doctor if not better in a few days.

## 2021-01-23 NOTE — ED Triage Notes (Signed)
Pt to triage via [facility] from friend's house.  Pt was going up steps and step collapsed and caused him to fall down 3 steps.  Didn't hit head.  No blood thinners.  Denies LOC.  C/o lower back, L knee, and R elbow pain.22g L hand and Fentanyl 100 mcg given PTA.

## 2021-01-31 ENCOUNTER — Ambulatory Visit (INDEPENDENT_AMBULATORY_CARE_PROVIDER_SITE_OTHER): Payer: No Typology Code available for payment source | Admitting: *Deleted

## 2021-01-31 DIAGNOSIS — J309 Allergic rhinitis, unspecified: Secondary | ICD-10-CM

## 2021-04-13 ENCOUNTER — Telehealth: Payer: Self-pay | Admitting: Allergy and Immunology

## 2021-04-13 NOTE — Telephone Encounter (Signed)
Called and lvm asking patient if he wanted a renewal of authorization request sent to [place]. Patient has not been seen for an office visit since 02-10-2020. Patient's last injection was 01-31-2021.

## 2021-04-29 NOTE — Telephone Encounter (Signed)
Patient called to find out how he can get started back on his injections. I let patient know that his authorization from the [place] expired 04-26-2021 & that we cannot start his injections until we receive a new authorization from the [place]. I advised patient that I did call and lvm to see if he wanted a new authorization. Patient stated he was in [place] for two months which is why he had not come in for his injections. I advised patient to make an appointment with [place] and to reach out to his primary at the [facility] so that a new authorization could be approved. I let patient know that I would send a renewal of authorization request on his behalf but he would need to reach out to his primary. Pt verbalized understanding.   Patient made a follow up appointment for 06-14-2021. I advised patient once we received new authorization I would reach out to him so that we can get him started on his injections again.  I have faxed renewal authorization request to [facility], [phone].   I will keep a close eye out for his authorization.

## 2021-05-03 ENCOUNTER — Ambulatory Visit: Payer: No Typology Code available for payment source | Admitting: Orthopaedic Surgery

## 2021-05-05 ENCOUNTER — Ambulatory Visit: Payer: No Typology Code available for payment source | Admitting: Orthopaedic Surgery

## 2021-05-19 ENCOUNTER — Encounter: Payer: Self-pay | Admitting: Orthopaedic Surgery

## 2021-05-19 ENCOUNTER — Ambulatory Visit (INDEPENDENT_AMBULATORY_CARE_PROVIDER_SITE_OTHER): Payer: No Typology Code available for payment source

## 2021-05-19 ENCOUNTER — Ambulatory Visit (INDEPENDENT_AMBULATORY_CARE_PROVIDER_SITE_OTHER): Payer: Self-pay | Admitting: Orthopaedic Surgery

## 2021-05-19 ENCOUNTER — Other Ambulatory Visit: Payer: Self-pay

## 2021-05-19 DIAGNOSIS — Z96652 Presence of left artificial knee joint: Secondary | ICD-10-CM

## 2021-05-19 NOTE — Progress Notes (Signed)
Office Visit Note   Patient: Craig Lopez           Date of Birth: 06-13-1960           MRN: [id number] Visit Date: 05/19/2021              Requested by: Charolett Bumpers, PA-C [address] [place],  [place] [address] PCP: Charolett Bumpers, PA-C   Assessment & Plan: Visit Diagnoses:  1. S/P TKR (total knee replacement), left     Plan: Craig Lopez comes in today for evaluation of anterior left knee pain.  Craig Lopez states that ever since Craig Lopez had a fall directly onto the front of his knee in October while going up stairs Craig Lopez has had this clicking and anterior knee pain.  Craig Lopez denies any varus valgus instability.  Craig Lopez initially had swelling after the fall but this got better after about a month.  Examination of the left knee shows a fully healed surgical scar.  Craig Lopez is quite tender to the anterior aspect of the knee near the inferior pole.  Extensor mechanism intact.  Trace effusion.  Collaterals are stable.  Craig Lopez has stable anterior and posterior translation.  Based on my assessment I feel that Craig Lopez has pain related to direct contusion to the knee.  Craig Lopez is quite tender to that area still.  I recommend continued symptomatic treatment.  Reassurance was provided that the implant is without complication.  Questions encouraged and answered.  Follow-up as needed.  Follow-Up Instructions: No follow-ups on file.   Orders:  Orders Placed This Encounter  Procedures   XR KNEE 3 VIEW LEFT   No orders of the defined types were placed in this encounter.     Procedures: No procedures performed   Clinical Data: No additional findings.   Subjective: Chief Complaint  Patient presents with   Left Knee - Pain    HPI  Review of Systems  Constitutional: Negative.   All other systems reviewed and are negative.   Objective: Vital Signs: There were no vitals taken for this visit.  Physical Exam Vitals and nursing note reviewed.  Constitutional:      Appearance: Craig Lopez is well-developed.  Pulmonary:      Effort: Pulmonary effort is normal.  Abdominal:     Palpations: Abdomen is soft.  Skin:    General: Skin is warm.  Neurological:     Mental Status: Craig Lopez is alert and oriented to person, place, and time.  Psychiatric:        Behavior: Behavior normal.        Thought Content: Thought content normal.        Judgment: Judgment normal.    Ortho Exam  Specialty Comments:  No specialty comments available.  Imaging: XR KNEE 3 VIEW LEFT  Result Date: 05/19/2021 Stable total knee replacement without complication    PMFS History: Patient Active Problem List   Diagnosis Date Noted   Symptomatic anemia 03/27/2019   Primary osteoarthritis of left knee 03/24/2019   Status post total left knee replacement 03/24/2019   Past Medical History:  Diagnosis Date   Arthritis    Back pain    Environmental allergies    Knee problem    Sleep apnea    rarely uses his CPAP    Family History  Problem Relation Age of Onset   Congestive Heart Failure Mother    Lung cancer Father        Craig Lopez was a smoker.   Congestive Heart  Failure Sister    Colon cancer Brother    Congestive Heart Failure Sister     Past Surgical History:  Procedure Laterality Date   APPENDECTOMY     CHONDROPLASTY Right 11/27/2018   Procedure: CHONDROPLASTY;  Surgeon: Tarry Kos, MD;  Location: [facility];  Service: Orthopedics;  Laterality: Right;   KNEE ARTHROSCOPY Right 11/27/2018   Procedure: RIGHT KNEE ARTHROSCOPY WITH MAJOR SYNOVECTOMY AND CHONDROPLASTY MEDIAL FEMORAL CONDYLE AND TROCHLEA;  Surgeon: Tarry Kos, MD;  Location: [facility];  Service: Orthopedics;  Laterality: Right;   KNEE SURGERY Left    Multiple Knee surgeries    SYNOVECTOMY Right 11/27/2018   Procedure: SYNOVECTOMY;  Surgeon: Tarry Kos, MD;  Location: [facility];  Service: Orthopedics;  Laterality: Right;   TOTAL KNEE ARTHROPLASTY Left 03/24/2019   TOTAL KNEE ARTHROPLASTY Left 03/24/2019   Procedure:  LEFT TOTAL KNEE ARTHROPLASTY;  Surgeon: Tarry Kos, MD;  Location: [facility];  Service: Orthopedics;  Laterality: Left;   WISDOM TOOTH EXTRACTION     Social History   Occupational History   Not on file  Tobacco Use   Smoking status: Never   Smokeless tobacco: Never  Vaping Use   Vaping Use: Never used  Substance and Sexual Activity   Alcohol use: Yes    Comment: socially   Drug use: No   Sexual activity: Not Currently

## 2021-05-26 NOTE — Telephone Encounter (Signed)
Received updated authorization for veteran [phone]) for the dates of 06-14-2021 to 06-14-2022. I have updated it in his system.   Called patient to let him know of updated authorization but was unable to leave message.

## 2021-06-01 NOTE — Telephone Encounter (Signed)
Called patient to schedule appt - per recording, the mailbox is full, please try your call again later, thank you.

## 2021-06-14 ENCOUNTER — Encounter: Payer: Self-pay | Admitting: Allergy and Immunology

## 2021-06-14 ENCOUNTER — Ambulatory Visit (INDEPENDENT_AMBULATORY_CARE_PROVIDER_SITE_OTHER): Payer: No Typology Code available for payment source | Admitting: Allergy and Immunology

## 2021-06-14 ENCOUNTER — Other Ambulatory Visit: Payer: Self-pay

## 2021-06-14 ENCOUNTER — Ambulatory Visit: Payer: Self-pay

## 2021-06-14 VITALS — BP 128/80 | HR 82 | Resp 17

## 2021-06-14 DIAGNOSIS — J309 Allergic rhinitis, unspecified: Secondary | ICD-10-CM

## 2021-06-14 DIAGNOSIS — J3089 Other allergic rhinitis: Secondary | ICD-10-CM

## 2021-06-14 MED ORDER — EPINEPHRINE 0.3 MG/0.3ML IJ SOAJ: 0.3000 mg | Freq: Once | INTRAMUSCULAR | 1 refills | Status: AC

## 2021-06-14 NOTE — Patient Instructions (Signed)
°  1.  Continue immunotherapy and EpiPen  2. Return to clinic in 12 months or earlier if problem

## 2021-06-14 NOTE — Progress Notes (Signed)
[place] [facility] [place] [facility] [place] [facility] [place]   Follow-up Note  Referring Provider: Charolett Bumpers, PA-C Primary Provider: Lynne Leader Date of Office Visit: 06/14/2021  Subjective:   Craig Lopez (DOB: December 01, 1960) is a 61 y.o. male who [facility] in re-evaluation of the following:  HPI: Craig Lopez returns to this clinic in evaluation of allergic rhinoconjunctivitis.  I have not seen him in this clinic since 10 February 2020.  He was using a course of immunotherapy which resulted in excellent control of his multiorgan atopic disease and minimized his requirement for any allergy medications over the course of the past several years.  He was using his immunotherapy every 4 weeks.  He had to visit [facility] for the past 3 months and had a very difficult time out there with nasal congestion and sneezing and itchy red watery eyes for which he restarted Flonase and Zyrtec.  He has been back in this area over the course of the past 2 and half weeks and he has done much better since he left the [facility] area.  He would like to restart his immunotherapy.  Allergies as of 06/14/2021       Reactions   Fish Allergy    Prazosin Other (See Comments)        Medication List   aspirin EC 81 MG tablet Take 1 tablet (81 mg total) by mouth 2 (two) times daily.   Bepotastine Besilate 1.5 % Soln Commonly known as: Bepreve Can use one drop in each eye twice daily if needed for itchy eyes.   celecoxib 200 MG capsule Commonly known as: CELEBREX Take 1 capsule (200 mg total) by mouth 2 (two) times daily.   diclofenac Sodium 1 % Gel Commonly known as: Voltaren Apply 2 g topically 4 (four) times daily.   EPINEPHrine 0.3 mg/0.3 mL Soaj injection Commonly known as: EPI-PEN Use as directed for life-threatening allergic reaction.   ferrous sulfate 325 (65 FE) MG tablet Take 1 tablet (325 mg total) by mouth daily.   ketorolac  10 MG tablet Commonly known as: TORADOL Take 1 tablet (10 mg total) by mouth 2 (two) times daily as needed.   lidocaine 5 % Commonly known as: LIDODERM Place 1 patch onto the skin daily as needed (back pain.). Remove & Discard patch within 12 hours or as directed by MD   Lubricating Plus Eye Drops 0.5 % Soln Generic drug: Carboxymethylcellulose Sod PF Place 1-2 drops into both eyes 3 (three) times daily as needed for dry eyes.   methocarbamol 750 MG tablet Commonly known as: ROBAXIN Take 1 tablet (750 mg total) by mouth 2 (two) times daily as needed for muscle spasms.   ondansetron 4 MG tablet Commonly known as: ZOFRAN Take 1-2 tablets (4-8 mg total) by mouth every 8 (eight) hours as needed for nausea or vomiting.   oxyCODONE 5 MG immediate release tablet Commonly known as: Oxy IR/ROXICODONE Take 5 mg by mouth every 4 (four) hours as needed (pain.).   oxyCODONE 5 MG immediate release tablet Commonly known as: Oxy IR/ROXICODONE Take 1-2 tablets (5-10 mg total) by mouth every 8 (eight) hours as needed for severe pain.   QUEtiapine 100 MG tablet Commonly known as: SEROQUEL Take 300 mg by mouth at bedtime.   sildenafil 100 MG tablet Commonly known as: VIAGRA Take 100 mg by mouth daily as needed for erectile dysfunction.   sulfamethoxazole-trimethoprim 800-160 MG tablet  Commonly known as: BACTRIM DS Take 1 tablet by mouth 2 (two) times daily.   venlafaxine XR 75 MG 24 hr capsule Commonly known as: EFFEXOR-XR Take 225 mg by mouth daily.    Past Medical History:  Diagnosis Date   Arthritis    Back pain    Environmental allergies    Knee problem    Sleep apnea    rarely uses his CPAP    Past Surgical History:  Procedure Laterality Date   APPENDECTOMY     CHONDROPLASTY Right 11/27/2018   Procedure: CHONDROPLASTY;  Surgeon: Tarry Kos, MD;  Location: [facility];  Service: Orthopedics;  Laterality: Right;   KNEE ARTHROSCOPY Right 11/27/2018    Procedure: RIGHT KNEE ARTHROSCOPY WITH MAJOR SYNOVECTOMY AND CHONDROPLASTY MEDIAL FEMORAL CONDYLE AND TROCHLEA;  Surgeon: Tarry Kos, MD;  Location: [facility];  Service: Orthopedics;  Laterality: Right;   KNEE SURGERY Left    Multiple Knee surgeries    SYNOVECTOMY Right 11/27/2018   Procedure: SYNOVECTOMY;  Surgeon: Tarry Kos, MD;  Location: [facility];  Service: Orthopedics;  Laterality: Right;   TOTAL KNEE ARTHROPLASTY Left 03/24/2019   TOTAL KNEE ARTHROPLASTY Left 03/24/2019   Procedure: LEFT TOTAL KNEE ARTHROPLASTY;  Surgeon: Tarry Kos, MD;  Location: [facility];  Service: Orthopedics;  Laterality: Left;   WISDOM TOOTH EXTRACTION      Review of systems negative except as noted in HPI / PMHx or noted below:  Review of Systems  Constitutional: Negative.   HENT: Negative.    Eyes: Negative.   Respiratory: Negative.    Cardiovascular: Negative.   Gastrointestinal: Negative.   Genitourinary: Negative.   Musculoskeletal: Negative.   Skin: Negative.   Neurological: Negative.   Endo/Heme/Allergies: Negative.   Psychiatric/Behavioral: Negative.      Objective:   Vitals:   06/14/21 1114  BP: 128/80  Pulse: 82  Resp: 17  SpO2: 98%          Physical Exam Constitutional:      Appearance: He is not diaphoretic.  HENT:     Head: Normocephalic.     Right Ear: Tympanic membrane, ear canal and external ear normal.     Left Ear: Tympanic membrane, ear canal and external ear normal.     Nose: Nose normal. No mucosal edema or rhinorrhea.     Mouth/Throat:     Pharynx: Uvula midline. No oropharyngeal exudate.  Eyes:     Conjunctiva/sclera: Conjunctivae normal.  Neck:     Thyroid: No thyromegaly.     Trachea: Trachea normal. No tracheal tenderness or tracheal deviation.  Cardiovascular:     Rate and Rhythm: Normal rate and regular rhythm.     Heart sounds: Normal heart sounds, S1 normal and S2 normal. No murmur heard. Pulmonary:     Effort:  No respiratory distress.     Breath sounds: Normal breath sounds. No stridor. No wheezing or rales.  Lymphadenopathy:     Head:     Right side of head: No tonsillar adenopathy.     Left side of head: No tonsillar adenopathy.     Cervical: No cervical adenopathy.  Skin:    Findings: No erythema or rash.     Nails: There is no clubbing.  Neurological:     Mental Status: He is alert.    Diagnostics: none  Assessment and Plan:   1. Other allergic rhinitis    1.  Continue immunotherapy and EpiPen  2. Return to  clinic in 12 months or earlier if problem  Craig Lopez will continue to use immunotherapy as this form of therapy has resulted in significant improvement regarding his multiorgan atopic disease.  I will see him back in this clinic in 1 year or earlier if there is a problem.  Laurette Schimke, MD Allergy / Immunology  [facility]

## 2021-06-15 ENCOUNTER — Encounter: Payer: Self-pay | Admitting: Allergy and Immunology

## 2021-06-21 DIAGNOSIS — J302 Other seasonal allergic rhinitis: Secondary | ICD-10-CM | POA: Diagnosis not present

## 2021-06-21 NOTE — Progress Notes (Signed)
VIALS EXP 06-22-22 ?

## 2021-06-23 ENCOUNTER — Ambulatory Visit (INDEPENDENT_AMBULATORY_CARE_PROVIDER_SITE_OTHER): Payer: No Typology Code available for payment source | Admitting: *Deleted

## 2021-06-23 DIAGNOSIS — J3089 Other allergic rhinitis: Secondary | ICD-10-CM | POA: Diagnosis not present

## 2021-06-23 DIAGNOSIS — J309 Allergic rhinitis, unspecified: Secondary | ICD-10-CM | POA: Diagnosis not present

## 2021-06-28 ENCOUNTER — Ambulatory Visit (INDEPENDENT_AMBULATORY_CARE_PROVIDER_SITE_OTHER): Payer: No Typology Code available for payment source

## 2021-06-28 DIAGNOSIS — J309 Allergic rhinitis, unspecified: Secondary | ICD-10-CM

## 2021-07-05 ENCOUNTER — Ambulatory Visit (INDEPENDENT_AMBULATORY_CARE_PROVIDER_SITE_OTHER): Payer: No Typology Code available for payment source

## 2021-07-05 DIAGNOSIS — J309 Allergic rhinitis, unspecified: Secondary | ICD-10-CM

## 2021-07-12 ENCOUNTER — Ambulatory Visit (INDEPENDENT_AMBULATORY_CARE_PROVIDER_SITE_OTHER): Payer: No Typology Code available for payment source

## 2021-07-12 DIAGNOSIS — J309 Allergic rhinitis, unspecified: Secondary | ICD-10-CM

## 2021-07-15 ENCOUNTER — Encounter (HOSPITAL_BASED_OUTPATIENT_CLINIC_OR_DEPARTMENT_OTHER): Payer: Self-pay

## 2021-07-15 ENCOUNTER — Emergency Department (HOSPITAL_BASED_OUTPATIENT_CLINIC_OR_DEPARTMENT_OTHER)
Admission: EM | Admit: 2021-07-15 | Discharge: 2021-07-15 | Disposition: A | Discharge status: Home / Self Care | Payer: No Typology Code available for payment source | Attending: Emergency Medicine | Admitting: Emergency Medicine

## 2021-07-15 ENCOUNTER — Other Ambulatory Visit: Payer: Self-pay

## 2021-07-15 DIAGNOSIS — Z7982 Long term (current) use of aspirin: Secondary | ICD-10-CM | POA: Diagnosis not present

## 2021-07-15 DIAGNOSIS — S91114A Laceration without foreign body of right lesser toe(s) without damage to nail, initial encounter: Secondary | ICD-10-CM | POA: Insufficient documentation

## 2021-07-15 DIAGNOSIS — S91116A Laceration without foreign body of unspecified lesser toe(s) without damage to nail, initial encounter: Secondary | ICD-10-CM

## 2021-07-15 DIAGNOSIS — S90934A Unspecified superficial injury of right lesser toe(s), initial encounter: Secondary | ICD-10-CM | POA: Diagnosis present

## 2021-07-15 DIAGNOSIS — W268XXA Contact with other sharp object(s), not elsewhere classified, initial encounter: Secondary | ICD-10-CM | POA: Diagnosis not present

## 2021-07-15 NOTE — ED Provider Notes (Addendum)
?[facility] EM[facility]PT ?Provider Note ? ? ?CSN: [id number] ?Arrival date & time: 07/15/21  9211 ? ?  ? ?History ? ?Chief Complaint  ?Patient presents with  ? Toe Injury  ? ? ?Craig Lopez is a 61 y.o. male. ? ?The history is provided by the patient.  ?Laceration ?Location:  Toe ?Toe laceration location:  R little toe ?Length:  .9 ?Depth:  Cutaneous ?Quality: straight   ?Bleeding: controlled   ?Time since incident:  4 hours ?Laceration mechanism:  Metal edge ?Pain details:  ?  Quality:  Aching ?  Severity:  Mild ?  Timing:  Constant ?  Progression:  Unchanged ?Foreign body present:  No foreign bodies ?Relieved by:  Nothing ?Ineffective treatments:  None tried ?Tetanus status:  Up to date (10/23/18) ?Associated symptoms: no fever and no numbness   ? ?  ? ?Home Medications ?Prior to Admission medications   ?Medication Sig Start Date End Date Taking? Authorizing Provider  ?amoxicillin (AMOXIL) 500 MG tablet Take 4 tabs one hour prior to dental work ?Patient not taking: Reported on 06/14/2021 08/01/19   Cristie Hem, PA-C  ?aspirin EC 81 MG tablet Take 1 tablet (81 mg total) by mouth 2 (two) times daily. ?Patient not taking: Reported on 06/14/2021 03/24/19   Tarry Kos, MD  ?Bepotastine Besilate (BEPREVE) 1.5 % SOLN Can use one drop in each eye twice daily if needed for itchy eyes. ?Patient taking differently: Place 1 drop into both eyes 2 (two) times daily as needed (itchy eyes. (Spring/Fall ONLY)). Can use one drop in each eye twice daily if needed for itchy eyes. 08/03/15   Kozlow, Alvira Philips, MD  ?celecoxib (CELEBREX) 200 MG capsule Take 1 capsule (200 mg total) by mouth 2 (two) times daily. ?Patient not taking: Reported on 06/14/2021 04/03/19   Tarry Kos, MD  ?diclofenac Sodium (VOLTAREN) 1 % GEL Apply 2 g topically 4 (four) times daily. 07/13/20   Cristie Hem, PA-C  ?EPINEPHrine 0.3 mg/0.3 mL IJ SOAJ injection Use as directed for life-threatening allergic reaction. ?Patient taking differently:  Inject 0.3 mg into the muscle as needed for anaphylaxis. Use as directed for life-threatening allergic reaction. 08/03/15   Kozlow, Alvira Philips, MD  ?ferrous sulfate 325 (65 FE) MG tablet Take 1 tablet (325 mg total) by mouth daily. 03/29/19 04/28/19  Albertha Ghee, MD  ?ketorolac (TORADOL) 10 MG tablet Take 1 tablet (10 mg total) by mouth 2 (two) times daily as needed. 03/24/19   Tarry Kos, MD  ?lidocaine (LIDODERM) 5 % Place 1 patch onto the skin daily as needed (back pain.). Remove & Discard patch within 12 hours or as directed by MD    [provider]  ?LUBRICATING PLUS EYE DROPS 0.5 % SOLN Place 1-2 drops into both eyes 3 (three) times daily as needed for dry eyes. 10/28/18   [provider]  ?methocarbamol (ROBAXIN) 750 MG tablet Take 1 tablet (750 mg total) by mouth 2 (two) times daily as needed for muscle spasms. ?Patient not taking: Reported on 06/14/2021 03/24/19   Tarry Kos, MD  ?ondansetron (ZOFRAN) 4 MG tablet Take 1-2 tablets (4-8 mg total) by mouth every 8 (eight) hours as needed for nausea or vomiting. 03/24/19   Tarry Kos, MD  ?oxyCODONE (OXY IR/ROXICODONE) 5 MG immediate release tablet Take 5 mg by mouth every 4 (four) hours as needed (pain.). ?Patient not taking: Reported on 06/14/2021    [provider]  ?oxyCODONE (OXY IR/ROXICODONE) 5 MG  immediate release tablet Take 1-2 tablets (5-10 mg total) by mouth every 8 (eight) hours as needed for severe pain. 03/24/19   Tarry KosXu, Naiping M, MD  ?QUEtiapine (SEROQUEL) 100 MG tablet Take 300 mg by mouth at bedtime.    [provider]  ?sildenafil (VIAGRA) 100 MG tablet Take 100 mg by mouth daily as needed for erectile dysfunction. 12/30/18   [provider]  ?sulfamethoxazole-trimethoprim (BACTRIM DS) 800-160 MG tablet Take 1 tablet by mouth 2 (two) times daily. ?Patient not taking: Reported on 06/14/2021 03/24/19   Tarry KosXu, Naiping M, MD  ?venlafaxine XR (EFFEXOR-XR) 75 MG 24 hr capsule Take 225 mg by mouth daily. 12/26/18    [provider]  ?   ? ?Allergies    ?Patient has no known allergies.   ? ?Review of Systems   ?Review of Systems  ?Constitutional:  Negative for fever.  ?HENT:  Negative for facial swelling.   ?Eyes:  Negative for redness.  ?Respiratory:  Negative for wheezing and stridor.   ?Cardiovascular:  Negative for palpitations.  ?Skin:  Positive for wound.  ?Neurological:  Negative for facial asymmetry.  ?All other systems reviewed and are negative. ? ?Physical Exam ?Updated Vital Signs ?BP (!) 130/91 (BP Location: Right Arm)   Pulse 85   Temp 98.2 ?F (36.8 ?C) (Oral)   Resp 16   Ht 6\' 1"  (1.854 m)   Wt 129.7 kg   SpO2 100%   BMI 37.73 kg/m?  ?Physical Exam ?Vitals and nursing note reviewed.  ?Constitutional:   ?   General: He is not in acute distress. ?   Appearance: Normal appearance.  ?HENT:  ?   Head: Normocephalic and atraumatic.  ?   Nose: Nose normal.  ?Eyes:  ?   Conjunctiva/sclera: Conjunctivae normal.  ?   Pupils: Pupils are equal, round, and reactive to light.  ?Cardiovascular:  ?   Rate and Rhythm: Normal rate and regular rhythm.  ?   Pulses: Normal pulses.  ?   Heart sounds: Normal heart sounds.  ?Pulmonary:  ?   Effort: Pulmonary effort is normal.  ?   Breath sounds: Normal breath sounds.  ?Abdominal:  ?   General: Abdomen is flat.  ?   Palpations: Abdomen is soft.  ?   Tenderness: There is no abdominal tenderness. There is no guarding.  ?Musculoskeletal:     ?   General: Normal range of motion.  ?   Cervical back: Normal range of motion and neck supple.  ?     Feet: ? ?Skin: ?   General: Skin is warm and dry.  ?   Capillary Refill: Capillary refill takes less than 2 seconds.  ?Neurological:  ?   General: No focal deficit present.  ?   Mental Status: He is alert and oriented to person, place, and time.  ?   Deep Tendon Reflexes: Reflexes normal.  ?Psychiatric:     ?   Mood and Affect: Mood normal.     ?   Behavior: Behavior normal.  ? ? ?ED Results / Procedures / Treatments   ?Labs ?(all labs  ordered are listed, but only abnormal results are displayed) ?Labs Reviewed - No data to display ? ?EKG ?None ? ?Radiology ?No results found. ? ?Procedures ?Marland Kitchen..Laceration Repair ? ?Date/Time: 07/15/2021 4:01 AM ?Performed by: Cy BlamerPalumbo, Gracilyn Gunia, MD ?Authorized by: Cy BlamerPalumbo, Regnia Mathwig, MD  ? ?Consent:  ?  Consent obtained:  Verbal ?  Risks discussed:  Infection, need for additional repair, pain, poor cosmetic result  and poor wound healing ?  Alternatives discussed:  No treatment ?Universal protocol:  ?  Patient identity confirmed:  Arm band ?Anesthesia:  ?  Anesthesia method:  None ?Laceration details:  ?  Location:  Toe ?  Toe location:  R little toe ?  Length (cm):  0.9 ?  Depth (mm):  0.5 ?Pre-procedure details:  ?  Preparation:  Patient was prepped and draped in usual sterile fashion ?Exploration:  ?  Imaging outcome: foreign body not noted   ?  Wound exploration: wound explored through full range of motion   ?  Wound extent: no areolar tissue violation noted and no fascia violation noted   ?  Contaminated: no   ?Treatment:  ?  Area cleansed with:  Povidone-iodine, chlorhexidine and saline ?  Amount of cleaning:  Extensive ?  Irrigation method:  Syringe ?  Debridement:  None ?  Undermining:  None ?Skin repair:  ?  Repair method:  Tissue adhesive ?Approximation:  ?  Approximation:  Close ?Repair type:  ?  Repair type:  Simple ?Post-procedure details:  ?  Dressing:  Sterile dressing ?  Procedure completion:  Tolerated well, no immediate complications  ? ? ?Medications Ordered in ED ?Medications - No data to display ? ?ED Course/ Medical Decision Making/ A&P ?  ?                        ?Medical Decision Making ?Bumped right little tow on scale and has superficial laceration on dorsum of right little toe  ? ?Amount and/or Complexity of Data Reviewed ?Independent Historian: spouse ?   Details: see above ?External Data Reviewed: notes. ?   Details: outside orthopedics and PND notes ? ?Risk ?Risk Details: Tetanus is UTD. Wound  cleansed and irrigated copiously and then closed with dermabond.  Strict return precautions given.   ? ? ? ?Final Clinical Impression(s) / ED Diagnoses ?Final diagnoses:  ?None  ? ?Return for intractable cough, c

## 2021-07-15 NOTE — ED Triage Notes (Signed)
Pt arrives POV with his wife. ? ?He hit his small toe on right foot on bathroom scale, cutting top of toe. ? ?He does not know when his last Tetanus vaccine was given. ?

## 2021-08-12 ENCOUNTER — Ambulatory Visit (INDEPENDENT_AMBULATORY_CARE_PROVIDER_SITE_OTHER): Payer: No Typology Code available for payment source

## 2021-08-12 DIAGNOSIS — J309 Allergic rhinitis, unspecified: Secondary | ICD-10-CM | POA: Diagnosis not present

## 2021-09-15 ENCOUNTER — Ambulatory Visit (INDEPENDENT_AMBULATORY_CARE_PROVIDER_SITE_OTHER): Payer: No Typology Code available for payment source

## 2021-09-15 DIAGNOSIS — J309 Allergic rhinitis, unspecified: Secondary | ICD-10-CM

## 2021-09-23 ENCOUNTER — Ambulatory Visit (INDEPENDENT_AMBULATORY_CARE_PROVIDER_SITE_OTHER): Payer: No Typology Code available for payment source

## 2021-09-23 DIAGNOSIS — J309 Allergic rhinitis, unspecified: Secondary | ICD-10-CM | POA: Diagnosis not present

## 2021-10-04 ENCOUNTER — Ambulatory Visit (INDEPENDENT_AMBULATORY_CARE_PROVIDER_SITE_OTHER): Payer: No Typology Code available for payment source

## 2021-10-04 DIAGNOSIS — J309 Allergic rhinitis, unspecified: Secondary | ICD-10-CM

## 2021-10-11 ENCOUNTER — Ambulatory Visit (INDEPENDENT_AMBULATORY_CARE_PROVIDER_SITE_OTHER): Payer: No Typology Code available for payment source

## 2021-10-11 DIAGNOSIS — J309 Allergic rhinitis, unspecified: Secondary | ICD-10-CM | POA: Diagnosis not present

## 2021-10-24 ENCOUNTER — Ambulatory Visit (INDEPENDENT_AMBULATORY_CARE_PROVIDER_SITE_OTHER): Payer: No Typology Code available for payment source | Admitting: *Deleted

## 2021-10-24 DIAGNOSIS — J309 Allergic rhinitis, unspecified: Secondary | ICD-10-CM | POA: Diagnosis not present

## 2021-11-16 ENCOUNTER — Ambulatory Visit (INDEPENDENT_AMBULATORY_CARE_PROVIDER_SITE_OTHER): Payer: No Typology Code available for payment source

## 2021-11-16 DIAGNOSIS — J309 Allergic rhinitis, unspecified: Secondary | ICD-10-CM | POA: Diagnosis not present

## 2021-12-29 ENCOUNTER — Ambulatory Visit (INDEPENDENT_AMBULATORY_CARE_PROVIDER_SITE_OTHER): Payer: No Typology Code available for payment source | Admitting: *Deleted

## 2021-12-29 DIAGNOSIS — J309 Allergic rhinitis, unspecified: Secondary | ICD-10-CM

## 2022-02-02 ENCOUNTER — Ambulatory Visit (INDEPENDENT_AMBULATORY_CARE_PROVIDER_SITE_OTHER): Payer: No Typology Code available for payment source | Admitting: *Deleted

## 2022-02-02 DIAGNOSIS — J309 Allergic rhinitis, unspecified: Secondary | ICD-10-CM | POA: Diagnosis not present

## 2022-02-21 ENCOUNTER — Ambulatory Visit (INDEPENDENT_AMBULATORY_CARE_PROVIDER_SITE_OTHER): Payer: No Typology Code available for payment source

## 2022-02-21 DIAGNOSIS — J309 Allergic rhinitis, unspecified: Secondary | ICD-10-CM | POA: Diagnosis not present

## 2022-04-12 DIAGNOSIS — J302 Other seasonal allergic rhinitis: Secondary | ICD-10-CM

## 2022-04-12 NOTE — Progress Notes (Signed)
VIALS EXP 04-13-23 

## 2022-04-13 DIAGNOSIS — J3089 Other allergic rhinitis: Secondary | ICD-10-CM

## 2022-05-29 ENCOUNTER — Ambulatory Visit (INDEPENDENT_AMBULATORY_CARE_PROVIDER_SITE_OTHER): Payer: No Typology Code available for payment source | Admitting: *Deleted

## 2022-05-29 DIAGNOSIS — J309 Allergic rhinitis, unspecified: Secondary | ICD-10-CM | POA: Diagnosis not present

## 2023-09-01 ENCOUNTER — Emergency Department (HOSPITAL_COMMUNITY)

## 2023-09-01 ENCOUNTER — Emergency Department (HOSPITAL_COMMUNITY)
Admission: EM | Admit: 2023-09-01 | Discharge: 2023-09-01 | Disposition: A | Discharge status: Home / Self Care | Attending: Emergency Medicine | Admitting: Emergency Medicine

## 2023-09-01 ENCOUNTER — Encounter (HOSPITAL_COMMUNITY): Payer: Self-pay | Admitting: *Deleted

## 2023-09-01 ENCOUNTER — Other Ambulatory Visit: Payer: Self-pay

## 2023-09-01 DIAGNOSIS — S39012A Strain of muscle, fascia and tendon of lower back, initial encounter: Secondary | ICD-10-CM | POA: Diagnosis not present

## 2023-09-01 DIAGNOSIS — S161XXA Strain of muscle, fascia and tendon at neck level, initial encounter: Secondary | ICD-10-CM | POA: Insufficient documentation

## 2023-09-01 DIAGNOSIS — R519 Headache, unspecified: Secondary | ICD-10-CM | POA: Diagnosis not present

## 2023-09-01 DIAGNOSIS — Y92410 Unspecified street and highway as the place of occurrence of the external cause: Secondary | ICD-10-CM | POA: Diagnosis not present

## 2023-09-01 DIAGNOSIS — S3992XA Unspecified injury of lower back, initial encounter: Secondary | ICD-10-CM | POA: Diagnosis present
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
## 2023-09-13 ENCOUNTER — Emergency Department (HOSPITAL_COMMUNITY)
Admission: EM | Admit: 2023-09-13 | Discharge: 2023-09-13 | Disposition: A | Discharge status: Home / Self Care | Attending: Emergency Medicine | Admitting: Emergency Medicine

## 2023-09-13 ENCOUNTER — Encounter (HOSPITAL_COMMUNITY): Payer: Self-pay | Admitting: Emergency Medicine

## 2023-09-13 ENCOUNTER — Other Ambulatory Visit: Payer: Self-pay

## 2023-09-13 ENCOUNTER — Emergency Department (HOSPITAL_COMMUNITY)

## 2023-09-13 DIAGNOSIS — R519 Headache, unspecified: Secondary | ICD-10-CM | POA: Diagnosis present

## 2023-09-13 DIAGNOSIS — Z7982 Long term (current) use of aspirin: Secondary | ICD-10-CM | POA: Diagnosis not present

## 2023-09-13 DIAGNOSIS — D72819 Decreased white blood cell count, unspecified: Secondary | ICD-10-CM | POA: Diagnosis not present

## 2023-09-13 DIAGNOSIS — Y92410 Unspecified street and highway as the place of occurrence of the external cause: Secondary | ICD-10-CM | POA: Insufficient documentation

## 2023-09-13 LAB — BASIC METABOLIC PANEL WITH GFR
Anion gap: 9 (ref 5–15)
BUN: 10 mg/dL (ref 6–20)
CO2: 26 mmol/L (ref 22–32)
Calcium: 9.2 mg/dL (ref 8.9–10.3)
Chloride: 100 mmol/L (ref 98–111)
Creatinine, Ser: 0.94 mg/dL (ref 0.61–1.24)
GFR, Estimated: >60 mL/min (ref >60)
Glucose, Bld: 128 mg/dL — ABNORMAL HIGH (ref 70–99)
Potassium: 3.6 mmol/L (ref 3.5–5.1)
Sodium: 135 mmol/L (ref 135–145)

## 2023-09-13 LAB — I-STAT CHEM 8, ED
BUN: 10 mg/dL (ref 6–20)
Calcium, Ion: 1.12 mmol/L — ABNORMAL LOW (ref 1.15–1.40)
Chloride: 101 mmol/L (ref 98–111)
Creatinine, Ser: 1 mg/dL (ref 0.61–1.24)
Glucose, Bld: 133 mg/dL — ABNORMAL HIGH (ref 70–99)
HCT: 42 % (ref 39.0–52.0)
Hemoglobin: 14.3 g/dL (ref 13.0–17.0)
Potassium: 3.8 mmol/L (ref 3.5–5.1)
Sodium: 138 mmol/L (ref 135–145)
TCO2: 27 mmol/L (ref 22–32)

## 2023-09-13 LAB — CBC
HCT: 39.6 % (ref 39.0–52.0)
Hemoglobin: 12.5 g/dL — ABNORMAL LOW (ref 13.0–17.0)
MCH: 24.4 pg — ABNORMAL LOW (ref 26.0–34.0)
MCHC: 31.6 g/dL (ref 30.0–36.0)
MCV: 77.2 fL — ABNORMAL LOW (ref 80.0–100.0)
Platelets: 200 10*3/uL (ref 150–400)
RBC: 5.13 MIL/uL (ref 4.22–5.81)
RDW: 15.9 % — ABNORMAL HIGH (ref 11.5–15.5)
WBC: 3.3 10*3/uL — ABNORMAL LOW (ref 4.0–10.5)
nRBC: 0 % (ref 0.0–0.2)

## 2023-09-13 MED ORDER — PROCHLORPERAZINE EDISYLATE 10 MG/2ML IJ SOLN
10.0000 mg | Freq: Once | INTRAMUSCULAR | Status: AC
  Administered 2023-09-13: 10 mg via INTRAVENOUS
  Filled 2023-09-13: qty 2

## 2023-09-13 MED ORDER — KETOROLAC TROMETHAMINE 15 MG/ML IJ SOLN
15.0000 mg | Freq: Once | INTRAMUSCULAR | Status: AC
  Administered 2023-09-13: 15 mg via INTRAVENOUS
  Filled 2023-09-13: qty 1

## 2023-09-13 MED ORDER — DEXAMETHASONE SODIUM PHOSPHATE 10 MG/ML IJ SOLN
10.0000 mg | Freq: Once | INTRAMUSCULAR | Status: AC
  Administered 2023-09-13: 10 mg via INTRAVENOUS
  Filled 2023-09-13: qty 1

## 2023-09-13 MED ORDER — IOHEXOL 350 MG/ML SOLN
100.0000 mL | Freq: Once | INTRAVENOUS | Status: AC | PRN
  Administered 2023-09-13: 100 mL via INTRAVENOUS

## 2023-09-13 MED ORDER — DIPHENHYDRAMINE HCL 50 MG/ML IJ SOLN
12.5000 mg | Freq: Once | INTRAMUSCULAR | Status: AC
  Administered 2023-09-13: 12.5 mg via INTRAVENOUS
  Filled 2023-09-13: qty 1

## 2023-09-13 MED ORDER — BUTALBITAL-APAP-CAFFEINE 50-325-40 MG PO TABS
1.0000 | ORAL_TABLET | Freq: Four times a day (QID) | ORAL | 0 refills | Status: AC | PRN
Start: 2023-09-13 — End: 2023-09-23

## 2023-09-13 MED ORDER — IOHEXOL 350 MG/ML SOLN
75.0000 mL | Freq: Once | INTRAVENOUS | Status: DC | PRN
Start: 2023-09-13 — End: 2023-09-13

## 2023-09-13 NOTE — ED Notes (Signed)
Lab at BS 

## 2023-09-13 NOTE — ED Notes (Signed)
To CT, alert, NAD, calm
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
## 2023-09-13 NOTE — Discharge Instructions (Signed)
 As we discussed, your workup in the ER today was reassuring for acute findings.  Laboratory evaluation and CT imaging did not reveal any emergent cause for your pain.  Given that you improved with medications given to you today, no further evaluation is indicated.  I have given you a prescription for Fioricet which is a medication you can take as prescribed as needed for management of your headaches.  Do not drive or operate heavy machinery while taking this medication as it can be sedating.  Please follow-up closely with your PCP for continued evaluation and management of these symptoms.  Return if development of any new or worsening symptoms.

## 2023-09-13 NOTE — ED Notes (Signed)
 Ambulatory to room from triage, NAD, calm, steady gait.
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
## 2023-09-13 NOTE — ED Notes (Signed)
 Delay in d/c d/t multiple concurrent d/c s

## 2023-12-31 ENCOUNTER — Other Ambulatory Visit: Payer: Self-pay

## 2023-12-31 DIAGNOSIS — Z7982 Long term (current) use of aspirin: Secondary | ICD-10-CM | POA: Diagnosis not present

## 2023-12-31 DIAGNOSIS — T63441A Toxic effect of venom of bees, accidental (unintentional), initial encounter: Secondary | ICD-10-CM | POA: Insufficient documentation

## 2023-12-31 DIAGNOSIS — T63451A Toxic effect of venom of hornets, accidental (unintentional), initial encounter: Secondary | ICD-10-CM | POA: Diagnosis not present

## 2023-12-31 MED ORDER — ONDANSETRON 4 MG PO TBDP
4.0000 mg | ORAL_TABLET | Freq: Once | ORAL | Status: AC | PRN
  Administered 2023-12-31: 4 mg via ORAL
  Filled 2023-12-31: qty 1

## 2023-12-31 NOTE — ED Triage Notes (Signed)
 Pt POV reporting nausea and pain after being stung by swarm of underground bees while cutting grass today around 1600. No throat swelling or SOB.

## 2024-01-01 ENCOUNTER — Emergency Department (HOSPITAL_BASED_OUTPATIENT_CLINIC_OR_DEPARTMENT_OTHER)
Admission: EM | Admit: 2024-01-01 | Discharge: 2024-01-01 | Disposition: A | Discharge status: Home / Self Care | Attending: Emergency Medicine | Admitting: Emergency Medicine

## 2024-01-01 ENCOUNTER — Emergency Department (HOSPITAL_BASED_OUTPATIENT_CLINIC_OR_DEPARTMENT_OTHER): Admission: EM | Admit: 2024-01-01 | Discharge: 2024-01-01 | Disposition: A | Discharge status: Home / Self Care

## 2024-01-01 DIAGNOSIS — T63441D Toxic effect of venom of bees, accidental (unintentional), subsequent encounter: Secondary | ICD-10-CM

## 2024-01-01 DIAGNOSIS — H02841 Edema of right upper eyelid: Secondary | ICD-10-CM

## 2024-01-01 DIAGNOSIS — T63451A Toxic effect of venom of hornets, accidental (unintentional), initial encounter: Secondary | ICD-10-CM | POA: Insufficient documentation

## 2024-01-01 DIAGNOSIS — T63441A Toxic effect of venom of bees, accidental (unintentional), initial encounter: Secondary | ICD-10-CM

## 2024-01-01 MED ORDER — PREDNISONE 20 MG PO TABS
40.0000 mg | ORAL_TABLET | Freq: Once | ORAL | Status: AC
  Administered 2024-01-01: 40 mg via ORAL
  Filled 2024-01-01: qty 2

## 2024-01-01 MED ORDER — ACETAMINOPHEN 325 MG PO TABS
650.0000 mg | ORAL_TABLET | Freq: Once | ORAL | Status: AC
  Administered 2024-01-01: 650 mg via ORAL
  Filled 2024-01-01: qty 2

## 2024-01-01 MED ORDER — DIPHENHYDRAMINE HCL 25 MG PO CAPS
25.0000 mg | ORAL_CAPSULE | Freq: Once | ORAL | Status: AC
  Administered 2024-01-01: 25 mg via ORAL
  Filled 2024-01-01: qty 1

## 2024-01-01 MED ORDER — FAMOTIDINE 20 MG PO TABS: 20.0000 mg | ORAL_TABLET | Freq: Every day | ORAL | 0 refills | Status: AC

## 2024-01-01 MED ORDER — FAMOTIDINE 20 MG PO TABS
20.0000 mg | ORAL_TABLET | Freq: Once | ORAL | Status: AC
  Administered 2024-01-01: 20 mg via ORAL
  Filled 2024-01-01: qty 1

## 2024-01-01 MED ORDER — PREDNISONE 10 MG PO TABS: 20.0000 mg | ORAL_TABLET | Freq: Two times a day (BID) | ORAL | 0 refills | Status: AC

## 2024-01-01 MED ORDER — ACETAMINOPHEN 500 MG PO TABS
1000.0000 mg | ORAL_TABLET | Freq: Once | ORAL | Status: AC
  Administered 2024-01-01: 1000 mg via ORAL
  Filled 2024-01-01: qty 2

## 2024-01-01 MED ORDER — PREDNISONE 20 MG PO TABS: 40.0000 mg | ORAL_TABLET | Freq: Every day | ORAL | 0 refills | Status: AC

## 2024-01-01 MED ORDER — DIPHENHYDRAMINE HCL 25 MG PO CAPS
50.0000 mg | ORAL_CAPSULE | Freq: Once | ORAL | Status: AC
  Administered 2024-01-01: 50 mg via ORAL
  Filled 2024-01-01: qty 2

## 2024-01-01 MED ORDER — DIPHENHYDRAMINE HCL 25 MG PO TABS: 25.0000 mg | ORAL_TABLET | Freq: Four times a day (QID) | ORAL | 0 refills | Status: AC | PRN

## 2024-01-01 NOTE — Discharge Instructions (Signed)
 Begin taking prednisone  as prescribed.  Take Benadryl  25 mg every 6 hours as needed for itching.  Return to the ER if symptoms significantly worsen or change.

## 2024-01-01 NOTE — ED Provider Notes (Signed)
 [place] EMERGENCY DEPARTMENT AT [facility] Provider Note   CSN: [id number] Arrival date & time: 12/31/23  2115     Patient presents with: Insect Bite   Craig Lopez is a 63 y.o. male.   Patient is a 63 year old male presenting with complaints of multiple hornet stings.  He was operating a riding mower in his yard when he went over a ground hornet nest.  He reports being stung dozens of times.  He describes itching and burning throughout his torso, arms, and head.  He denies any difficulty breathing or swallowing.  He denies any throat swelling or irritation.       Prior to Admission medications   Medication Sig Start Date End Date Taking? Authorizing Provider  amoxicillin  (AMOXIL ) 500 MG tablet Take 4 tabs one hour prior to dental work Patient not taking: Reported on 06/14/2021 08/01/19   Jule Ronal CROME, PA-C  aspirin  EC 81 MG tablet Take 1 tablet (81 mg total) by mouth 2 (two) times daily. Patient not taking: Reported on 06/14/2021 03/24/19   Jerri Kay HERO, MD  Bepotastine  Besilate (BEPREVE) 1.5 % SOLN Can use one drop in each eye twice daily if needed for itchy eyes. Patient taking differently: Place 1 drop into both eyes 2 (two) times daily as needed (itchy eyes. (Spring/Fall ONLY)). Can use one drop in each eye twice daily if needed for itchy eyes. 08/03/15   Kozlow, Camellia PARAS, MD  diclofenac  Sodium (VOLTAREN ) 1 % GEL Apply 2 g topically 4 (four) times daily. 07/13/20   Stanbery, Mary L, PA-C  EPINEPHrine  0.3 mg/0.3 mL IJ SOAJ injection Use as directed for life-threatening allergic reaction. Patient taking differently: Inject 0.3 mg into the muscle as needed for anaphylaxis. Use as directed for life-threatening allergic reaction. 08/03/15   Kozlow, Camellia PARAS, MD  ferrous sulfate  325 (65 FE) MG tablet Take 1 tablet (325 mg total) by mouth daily. 03/29/19 04/28/19  Golda Lynwood PARAS, MD  ketorolac  (TORADOL ) 10 MG tablet Take 1 tablet (10 mg total) by mouth 2 (two) times daily as  needed. 03/24/19   Jerri Kay HERO, MD  lidocaine  (LIDODERM ) 5 % Place 1 patch onto the skin daily as needed (back pain.). Remove & Discard patch within 12 hours or as directed by MD    [provider]  LUBRICATING PLUS EYE DROPS 0.5 % SOLN Place 1-2 drops into both eyes 3 (three) times daily as needed for dry eyes. 10/28/18   [provider]  ondansetron  (ZOFRAN ) 4 MG tablet Take 1-2 tablets (4-8 mg total) by mouth every 8 (eight) hours as needed for nausea or vomiting. 03/24/19   Jerri Kay HERO, MD  oxyCODONE  (OXY IR/ROXICODONE ) 5 MG immediate release tablet Take 5 mg by mouth every 4 (four) hours as needed (pain.). Patient not taking: Reported on 06/14/2021    [provider]  oxyCODONE  (OXY IR/ROXICODONE ) 5 MG immediate release tablet Take 1-2 tablets (5-10 mg total) by mouth every 8 (eight) hours as needed for severe pain. 03/24/19   Jerri Kay HERO, MD  QUEtiapine  (SEROQUEL ) 100 MG tablet Take 300 mg by mouth at bedtime.    [provider]  sildenafil (VIAGRA) 100 MG tablet Take 100 mg by mouth daily as needed for erectile dysfunction. 12/30/18   [provider]  sulfamethoxazole -trimethoprim  (BACTRIM  DS) 800-160 MG tablet Take 1 tablet by mouth 2 (two) times daily. Patient not taking: Reported on 06/14/2021 03/24/19   Jerri Kay HERO, MD  venlafaxine  XR (EFFEXOR -XR) 75  MG 24 hr capsule Take 225 mg by mouth daily. 12/26/18   [provider]    Allergies: Patient has no known allergies.    Review of Systems  All other systems reviewed and are negative.   Updated Vital Signs BP (!) 168/119 (BP Location: Right Arm)   Pulse 93   Temp 98.2 F (36.8 C) (Oral)   Resp 20   Ht 6' 1 (1.854 m)   Wt 122.5 kg   SpO2 98%   BMI 35.62 kg/m   Physical Exam Vitals and nursing note reviewed.  Constitutional:      General: He is not in acute distress.    Appearance: He is well-developed. He is not diaphoretic.  HENT:     Head: Normocephalic and  atraumatic.     Mouth/Throat:     Mouth: Mucous membranes are moist.     Comments: There is no oral swelling noted. Cardiovascular:     Rate and Rhythm: Normal rate and regular rhythm.     Heart sounds: No murmur heard.    No friction rub.  Pulmonary:     Effort: Pulmonary effort is normal. No respiratory distress.     Breath sounds: Normal breath sounds. No wheezing or rales.  Abdominal:     General: Bowel sounds are normal. There is no distension.     Palpations: Abdomen is soft.     Tenderness: There is no abdominal tenderness.  Musculoskeletal:        General: Normal range of motion.     Cervical back: Normal range of motion and neck supple.  Skin:    General: Skin is warm and dry.  Neurological:     Mental Status: He is alert and oriented to person, place, and time.     Coordination: Coordination normal.     (all labs ordered are listed, but only abnormal results are displayed) Labs Reviewed - No data to display  EKG: None  Radiology: No results found.   Procedures   Medications Ordered in the ED  predniSONE  (DELTASONE ) tablet 40 mg (has no administration in time range)  ondansetron  (ZOFRAN -ODT) disintegrating tablet 4 mg (4 mg Oral Given 12/31/23 2122)  acetaminophen  (TYLENOL ) tablet 1,000 mg (1,000 mg Oral Given 01/01/24 0110)  diphenhydrAMINE  (BENADRYL ) capsule 50 mg (50 mg Oral Given 01/01/24 0110)                                    Medical Decision Making Risk OTC drugs. Prescription drug management.   Patient presenting after being stung multiple times by ground hornets.  He arrives here with stable vital signs and is afebrile.  Physical examination is unremarkable.  His lungs are clear and there is no stridor.  Patient to be treated with prednisone  and Benadryl  for the next several days and follow-up as needed.     Final diagnoses:  None    ED Discharge Orders     None          Geroldine Berg, MD 01/01/24 [phone]

## 2024-01-01 NOTE — Discharge Instructions (Addendum)
 It was a pleasure taking care of you today.  As discussed, I am sending you home with steroids, Pepcid , and Benadryl .  Take for the next 5 days.  I have included the number of the ophthalmologist.  Please call if symptoms do not improved by tomorrow.  Return to the ER for any worsening symptoms.  You may take over-the-counter ibuprofen  or Tylenol  as needed for pain.

## 2024-01-01 NOTE — ED Provider Notes (Signed)
 [place] EMERGENCY DEPARTMENT AT [facility] Provider Note   CSN: [id number] Arrival date & time: 01/01/24  1146     Patient presents with: Insect Bite   Craig Lopez is a 63 y.o. male with no significant past medical history who presents to the ED after being stung by both multiple hornets yesterday while operating a riding mower.  Seen in the ED early this morning and discharged with prednisone  and Benadryl  however, prescription sent to wrong pharmacy, so patient has not taken any medications.  Patient notes he woke up this morning with some swelling to right eyelid.  Denies any visual changes.  Denies any difficulties breathing or swallowing.  Denies any pain to right eye.  History obtained from patient and past medical records. No interpreter used during encounter.      Prior to Admission medications   Medication Sig Start Date End Date Taking? Authorizing Provider  diphenhydrAMINE  (BENADRYL ) 25 MG tablet Take 1 tablet (25 mg total) by mouth every 6 (six) hours as needed. 01/01/24  Yes Shambhavi Salley, Aleck BROCKS, PA-C  famotidine  (PEPCID ) 20 MG tablet Take 1 tablet (20 mg total) by mouth daily for 5 days. 01/01/24 01/06/24 Yes Crystalina Stodghill, Aleck BROCKS, PA-C  predniSONE  (DELTASONE ) 20 MG tablet Take 2 tablets (40 mg total) by mouth daily for 5 days. 01/01/24 01/06/24 Yes Jeral Zick, Aleck BROCKS, PA-C  amoxicillin  (AMOXIL ) 500 MG tablet Take 4 tabs one hour prior to dental work Patient not taking: Reported on 06/14/2021 08/01/19   Jule Ronal CROME, PA-C  aspirin  EC 81 MG tablet Take 1 tablet (81 mg total) by mouth 2 (two) times daily. Patient not taking: Reported on 06/14/2021 03/24/19   Jerri Kay HERO, MD  Bepotastine  Besilate (BEPREVE) 1.5 % SOLN Can use one drop in each eye twice daily if needed for itchy eyes. Patient taking differently: Place 1 drop into both eyes 2 (two) times daily as needed (itchy eyes. (Spring/Fall ONLY)). Can use one drop in each eye twice daily if needed for itchy eyes.  08/03/15   Kozlow, Camellia PARAS, MD  diclofenac  Sodium (VOLTAREN ) 1 % GEL Apply 2 g topically 4 (four) times daily. 07/13/20   Jule Ronal CROME, PA-C  EPINEPHrine  0.3 mg/0.3 mL IJ SOAJ injection Use as directed for life-threatening allergic reaction. Patient taking differently: Inject 0.3 mg into the muscle as needed for anaphylaxis. Use as directed for life-threatening allergic reaction. 08/03/15   Kozlow, Camellia PARAS, MD  ferrous sulfate  325 (65 FE) MG tablet Take 1 tablet (325 mg total) by mouth daily. 03/29/19 04/28/19  Golda Lynwood PARAS, MD  ketorolac  (TORADOL ) 10 MG tablet Take 1 tablet (10 mg total) by mouth 2 (two) times daily as needed. 03/24/19   Jerri Kay HERO, MD  lidocaine  (LIDODERM ) 5 % Place 1 patch onto the skin daily as needed (back pain.). Remove & Discard patch within 12 hours or as directed by MD    [provider]  LUBRICATING PLUS EYE DROPS 0.5 % SOLN Place 1-2 drops into both eyes 3 (three) times daily as needed for dry eyes. 10/28/18   [provider]  ondansetron  (ZOFRAN ) 4 MG tablet Take 1-2 tablets (4-8 mg total) by mouth every 8 (eight) hours as needed for nausea or vomiting. 03/24/19   Jerri Kay HERO, MD  oxyCODONE  (OXY IR/ROXICODONE ) 5 MG immediate release tablet Take 5 mg by mouth every 4 (four) hours as needed (pain.). Patient not taking: Reported on 06/14/2021    [provider]  oxyCODONE  (OXY  IR/ROXICODONE ) 5 MG immediate release tablet Take 1-2 tablets (5-10 mg total) by mouth every 8 (eight) hours as needed for severe pain. 03/24/19   Jerri Kay HERO, MD  predniSONE  (DELTASONE ) 10 MG tablet Take 2 tablets (20 mg total) by mouth 2 (two) times daily with a meal. 01/01/24   Geroldine Berg, MD  QUEtiapine  (SEROQUEL ) 100 MG tablet Take 300 mg by mouth at bedtime.    [provider]  sildenafil (VIAGRA) 100 MG tablet Take 100 mg by mouth daily as needed for erectile dysfunction. 12/30/18   [provider]  sulfamethoxazole -trimethoprim  (BACTRIM  DS) 800-160  MG tablet Take 1 tablet by mouth 2 (two) times daily. Patient not taking: Reported on 06/14/2021 03/24/19   Jerri Kay HERO, MD  venlafaxine  XR (EFFEXOR -XR) 75 MG 24 hr capsule Take 225 mg by mouth daily. 12/26/18   [provider]    Allergies: Patient has no known allergies.    Review of Systems  Constitutional:  Negative for fever.  Eyes:  Positive for redness. Negative for visual disturbance.  Respiratory:  Negative for shortness of breath.   Skin:  Positive for color change.    Updated Vital Signs BP (!) 142/96 (BP Location: Right Arm)   Pulse 77   Temp 98.2 F (36.8 C) (Oral)   Resp 18   SpO2 97%   Physical Exam Vitals and nursing note reviewed.  Constitutional:      General: He is not in acute distress.    Appearance: He is not ill-appearing.  HENT:     Head: Normocephalic.  Eyes:     Pupils: Pupils are equal, round, and reactive to light.     Comments: Edema to right eyelid.  EOMs intact.  See photo below.  Pupils equal and reactive.  Cardiovascular:     Rate and Rhythm: Normal rate and regular rhythm.     Pulses: Normal pulses.     Heart sounds: Normal heart sounds. No murmur heard.    No friction rub. No gallop.  Pulmonary:     Effort: Pulmonary effort is normal.     Breath sounds: Normal breath sounds.  Abdominal:     General: Abdomen is flat. There is no distension.     Palpations: Abdomen is soft.     Tenderness: There is no abdominal tenderness. There is no guarding or rebound.  Musculoskeletal:        General: Normal range of motion.     Cervical back: Neck supple.  Skin:    General: Skin is warm and dry.  Neurological:     General: No focal deficit present.     Mental Status: He is alert.  Psychiatric:        Mood and Affect: Mood normal.        Behavior: Behavior normal.     (all labs ordered are listed, but only abnormal results are displayed) Labs Reviewed - No data to display  EKG: None  Radiology: No results  found.   Procedures   Medications Ordered in the ED  acetaminophen  (TYLENOL ) tablet 650 mg (650 mg Oral Given 01/01/24 1233)  famotidine  (PEPCID ) tablet 20 mg (20 mg Oral Given 01/01/24 1233)  diphenhydrAMINE  (BENADRYL ) capsule 25 mg (25 mg Oral Given 01/01/24 1233)  predniSONE  (DELTASONE ) tablet 40 mg (40 mg Oral Given 01/01/24 1233)  Medical Decision Making Risk OTC drugs. Prescription drug management.   63 year old male presents to the ED after being stung by multiple hornets last night.  Seen in the ED early this morning and discharged with steroids and Benadryl  however, sent to wrong pharmacy so patient has not taken any medications.  Woke up this morning with edema to right eye.  Remembers being stung on the outside of his right eye.  No visual changes.  Upon arrival, stable vitals.  Patient in no acute distress.  Patient does have some edema to right eyelid.  Visual acuity 20/40 R, 20/30L, 20/40 bilateral. No signs of globe injury. EOMs intact. Lungs clear to auscultation bilateral without stridor or wheeze.  Airway patent.  No signs of anaphylaxis.  Suspect eyelid edema likely secondary to allergic reaction from hornet sting.  Low suspicion for cellulitis so will hold off on antibiotics at this time.  Given prednisone , pepcid , and benadryl  here in the ED and discharged with same. ophthalmology number given to patient at discharge and advised to call if symptoms do not improve by tomorrow afternoon.  Patient stable for discharge. Strict ED precautions discussed with patient. Patient states understanding and agrees to plan. Patient discharged home in no acute distress and stable vitals     Final diagnoses:  Bee sting, accidental or unintentional, subsequent encounter  Edema of right upper eyelid    ED Discharge Orders          Ordered    predniSONE  (DELTASONE ) 20 MG tablet  Daily        01/01/24 1219    famotidine  (PEPCID ) 20 MG tablet  Daily         01/01/24 1219    diphenhydrAMINE  (BENADRYL ) 25 MG tablet  Every 6 hours PRN        01/01/24 1219               Jocelin Schuelke C, PA-C 01/01/24 1246    Kammerer, Megan L, DO 01/02/24 1604

## 2024-01-01 NOTE — ED Triage Notes (Signed)
 Patient states multiple bee stings last night. Seen last night for same but reports new swelling to left eye.

## 2024-01-01 NOTE — ED Notes (Signed)
 DC paperwork given and verbally understood.
